# Patient Record
Sex: Female | Born: 1935
Health system: Southern US, Community
[De-identification: ages and names within clinical notes are randomized; demographics above are authoritative.]

## PROBLEM LIST (undated history)

## (undated) DIAGNOSIS — J302 Other seasonal allergic rhinitis: Secondary | ICD-10-CM

## (undated) DIAGNOSIS — Z9889 Other specified postprocedural states: Secondary | ICD-10-CM

## (undated) DIAGNOSIS — T63441A Toxic effect of venom of bees, accidental (unintentional), initial encounter: Secondary | ICD-10-CM

## (undated) DIAGNOSIS — M4800 Spinal stenosis, site unspecified: Secondary | ICD-10-CM

## (undated) DIAGNOSIS — M5126 Other intervertebral disc displacement, lumbar region: Secondary | ICD-10-CM

## (undated) DIAGNOSIS — K649 Unspecified hemorrhoids: Secondary | ICD-10-CM

## (undated) DIAGNOSIS — Z8719 Personal history of other diseases of the digestive system: Secondary | ICD-10-CM

## (undated) DIAGNOSIS — Z8619 Personal history of other infectious and parasitic diseases: Secondary | ICD-10-CM

## (undated) DIAGNOSIS — R112 Nausea with vomiting, unspecified: Secondary | ICD-10-CM

## (undated) DIAGNOSIS — L439 Lichen planus, unspecified: Secondary | ICD-10-CM

## (undated) DIAGNOSIS — M1990 Unspecified osteoarthritis, unspecified site: Secondary | ICD-10-CM

## (undated) DIAGNOSIS — M48061 Spinal stenosis, lumbar region without neurogenic claudication: Secondary | ICD-10-CM

## (undated) DIAGNOSIS — K219 Gastro-esophageal reflux disease without esophagitis: Secondary | ICD-10-CM

## (undated) DIAGNOSIS — S82009A Unspecified fracture of unspecified patella, initial encounter for closed fracture: Secondary | ICD-10-CM

## (undated) HISTORY — PX: OTHER SURGICAL HISTORY: SHX169

## (undated) HISTORY — PX: BACK SURGERY: SHX140

## (undated) HISTORY — PX: HERNIA REPAIR: SHX51

## (undated) HISTORY — PX: ABDOMINAL HYSTERECTOMY: SHX81

---

## 1998-03-10 ENCOUNTER — Ambulatory Visit (HOSPITAL_COMMUNITY): Admission: RE | Admit: 1998-03-10 | Discharge: 1998-03-10 | Payer: Self-pay | Admitting: Family Medicine

## 1998-03-30 ENCOUNTER — Ambulatory Visit (HOSPITAL_COMMUNITY): Admission: RE | Admit: 1998-03-30 | Discharge: 1998-03-30 | Payer: Self-pay | Admitting: Gastroenterology

## 1998-05-18 ENCOUNTER — Encounter: Payer: Self-pay | Admitting: Gastroenterology

## 1998-05-18 ENCOUNTER — Ambulatory Visit (HOSPITAL_COMMUNITY): Admission: RE | Admit: 1998-05-18 | Discharge: 1998-05-18 | Payer: Self-pay | Admitting: Gastroenterology

## 1999-12-08 ENCOUNTER — Ambulatory Visit (HOSPITAL_COMMUNITY): Admission: RE | Admit: 1999-12-08 | Discharge: 1999-12-08 | Payer: Self-pay | Admitting: Gastroenterology

## 1999-12-08 ENCOUNTER — Encounter (INDEPENDENT_AMBULATORY_CARE_PROVIDER_SITE_OTHER): Payer: Self-pay | Admitting: Specialist

## 1999-12-09 ENCOUNTER — Encounter: Payer: Self-pay | Admitting: Gastroenterology

## 1999-12-09 ENCOUNTER — Ambulatory Visit (HOSPITAL_COMMUNITY): Admission: RE | Admit: 1999-12-09 | Discharge: 1999-12-09 | Payer: Self-pay | Admitting: Gastroenterology

## 2000-11-15 ENCOUNTER — Encounter: Admission: RE | Admit: 2000-11-15 | Discharge: 2000-11-15 | Payer: Self-pay | Admitting: Family Medicine

## 2000-11-15 ENCOUNTER — Encounter: Payer: Self-pay | Admitting: Family Medicine

## 2003-07-02 ENCOUNTER — Encounter: Admission: RE | Admit: 2003-07-02 | Discharge: 2003-07-02 | Payer: Self-pay | Admitting: Internal Medicine

## 2004-07-13 ENCOUNTER — Encounter (INDEPENDENT_AMBULATORY_CARE_PROVIDER_SITE_OTHER): Payer: Self-pay | Admitting: *Deleted

## 2004-07-13 ENCOUNTER — Ambulatory Visit (HOSPITAL_COMMUNITY): Admission: RE | Admit: 2004-07-13 | Discharge: 2004-07-13 | Payer: Self-pay | Admitting: Gastroenterology

## 2004-08-16 ENCOUNTER — Encounter: Admission: RE | Admit: 2004-08-16 | Discharge: 2004-08-16 | Payer: Self-pay | Admitting: Orthopedic Surgery

## 2004-10-11 ENCOUNTER — Observation Stay (HOSPITAL_COMMUNITY): Admission: RE | Admit: 2004-10-11 | Discharge: 2004-10-12 | Payer: Self-pay | Admitting: Orthopedic Surgery

## 2004-10-11 ENCOUNTER — Ambulatory Visit (HOSPITAL_BASED_OUTPATIENT_CLINIC_OR_DEPARTMENT_OTHER): Admission: RE | Admit: 2004-10-11 | Discharge: 2004-10-11 | Payer: Self-pay | Admitting: Orthopedic Surgery

## 2005-08-17 ENCOUNTER — Encounter: Admission: RE | Admit: 2005-08-17 | Discharge: 2005-08-17 | Payer: Self-pay | Admitting: Family Medicine

## 2007-03-22 ENCOUNTER — Encounter: Admission: RE | Admit: 2007-03-22 | Discharge: 2007-03-22 | Payer: Self-pay | Admitting: Family Medicine

## 2007-04-13 ENCOUNTER — Encounter: Admission: RE | Admit: 2007-04-13 | Discharge: 2007-04-13 | Payer: Self-pay | Admitting: Family Medicine

## 2007-06-01 ENCOUNTER — Encounter (HOSPITAL_COMMUNITY): Admission: RE | Admit: 2007-06-01 | Discharge: 2007-07-01 | Payer: Self-pay | Admitting: Podiatry

## 2008-06-10 ENCOUNTER — Encounter: Admission: RE | Admit: 2008-06-10 | Discharge: 2008-06-10 | Payer: Self-pay | Admitting: Family Medicine

## 2009-02-20 ENCOUNTER — Encounter: Admission: RE | Admit: 2009-02-20 | Discharge: 2009-02-20 | Payer: Self-pay | Admitting: Family Medicine

## 2009-06-16 ENCOUNTER — Encounter: Admission: RE | Admit: 2009-06-16 | Discharge: 2009-06-16 | Payer: Self-pay | Admitting: Family Medicine

## 2010-01-20 ENCOUNTER — Encounter: Admission: RE | Admit: 2010-01-20 | Discharge: 2010-01-20 | Payer: Self-pay | Admitting: Gastroenterology

## 2010-09-20 ENCOUNTER — Encounter: Payer: Self-pay | Admitting: Family Medicine

## 2011-01-14 NOTE — Op Note (Signed)
Linda Watson, Linda Watson               ACCOUNT NO.:  [id number]   MEDICAL RECORD NO.:  [id number]          PATIENT TYPE:  AMB   LOCATION:  [facility]                         FACILITY:  [facility]   PHYSICIAN:  Marlowe Kays, M.D.  DATE OF BIRTH:  September 08, 1935   DATE OF PROCEDURE:  10/11/2004  DATE OF DISCHARGE:                                 OPERATIVE REPORT   PREOPERATIVE DIAGNOSES:  1.  Degenerative arthritis acromioclavicular joint.  2.  Chronic impingement syndrome with bursal surface rotator cuff tears,      left shoulder.   POSTOPERATIVE DIAGNOSES:  1.  Degenerative arthritis acromioclavicular joint.  2.  Chronic impingement syndrome with bursal surface rotator cuff tears,      left shoulder.   OPERATION PERFORMED:  1.  Left shoulder arthroscopy (normal examination).  2.  Arthroscopic subacromial decompression.  3.  Open resection distal left clavicle.   SURGEON:  Marlowe Kays, M.D.   ASSISTANTDruscilla Brownie. Cherlynn June.   ANESTHESIA:  General.   PATHOLOGY AND JUSTIFICATION FOR PROCEDURE:  Chronic __________ pain, left  shoulder with MRI demonstrating the bursal surface rotator cuff tears with  type two acromion and degenerative arthritis of the [facility] joint confirmed on  plain films.   DESCRIPTION OF PROCEDURE:  Under satisfactory general anesthesia, beach  chair position on the Schlein frame, surgery was preceded by interscalene  block by anesthesiologist, left shoulder girdle was prepped with DuraPrep  and draped in a sterile field.  Anatomy of the shoulder joint was marked out  for hemostasis.  The incision for the distal clavicle resection, lateral  portal and posterior portal as well as a subacromial space were all  infiltrated with 0.5% Marcaine with Adrenalin.  Through a posterior soft  spot portal, I atraumatically entered the glenohumeral joint and found it to  be normal on examination.  I then redirected the scope to the subacromial  space and through a lateral portal   introduced a blunt cannula followed by  the 4.2 shaver.  She had a very thickened bursa and I finally located, I was  able to triangulate and see the shaver and began removing the thickened  bursa.  Following this, it was noted that she had severe impingement problem  with several bony spikes.  I used a 4-0 oval bur to begin burring down the  subacromial space and went back and forth between the bur with the 4.2  shaver, shaving down the rotator cuff and the vaporizer removing soft tissue  and coagulating until we had a nice wide decompression.  I then made  incision over the distal clavicle and located the [facility] joint, I measured off a  little more than a centimeter of distal clavicle and protected the  underlying structures with Baby Homans, used a micro saw to resect the end  of the clavicle which I removed.  I then smoothed off the cut end of the  bone and applied bone wax. After irrigating the wound well, placed Gelfoam  in the resection interval and closed the wound with interrupted #1 Vicryl in  the fascia,  3-0 Vicryl  subcutaneous tissue, with Steri-Strips on the skin and 4-0 nylon  in the two portals.  Dry sterile dressing, shoulder immobilized were  applied.  The patient tolerated the procedure well and was taken to recovery  in satisfactory condition with no known complications.      JA/MEDQ  D:  10/11/2004  T:  10/11/2004  Job:  [id number]

## 2011-01-14 NOTE — Op Note (Signed)
Linda Watson, Linda Watson               ACCOUNT NO.:  [id number]   MEDICAL RECORD NO.:  [id number]          PATIENT TYPE:  AMB   LOCATION:  ENDO                         FACILITY:  [facility]   PHYSICIAN:  Petra Kuba, M.D.    DATE OF BIRTH:  [birth year]   DATE OF PROCEDURE:  07/13/2004  DATE OF DISCHARGE:                                 OPERATIVE REPORT   PROCEDURE:  Colonoscopy.   INDICATIONS FOR PROCEDURE:  Patient almost due for colonic screening.  Guaiac-positive on physical exam.   DESCRIPTION OF PROCEDURE:  Consent was signed after the risks, benefits,  methods, options were thoroughly discussed in the office.   MEDICATIONS GIVEN:  Demerol 70, Versed 7.   DESCRIPTION OF PROCEDURE:  Rectal inspection was pertinent for small  external hemorrhoids.  Digital exam was negative.  The pediatric video  adjustable colonoscope was inserted, and again, with difficulty due to a  very tortuous sigmoid, once through this area was fairly easily able to be  advanced around the colon to the cecum.  This did require abdominal pressure  but no position changes.  No abnormality or signs of bleeding were seen on  insertion.  The cecum was identified by the appendiceal orifice and the  ileocecal valve.  The scope was inserted a short way within the terminal  ileum which was normal.  Photo documentation was obtained.  The scope was  slowly withdrawn.  The prep was adequate.  There was some liquid stool that  required washing and suctioning on slow withdrawal through the colon.  The  cecum, ascending, transverse, descending, and the majority of the sigmoid  were normal.  She did have a very tortuous sigmoid, and when we fell back  around the loop, we did try to readvance around the curves to decrease the  chances of missing things, but it was very tortuous.  At the sigmoid/rectal  junction, probably due to scope trauma, was a small __________ a polyp, but  a few cold biopsies were obtained.  Once back in  the rectum, anorectal pull  through and retroflexion confirmed some small hemorrhoids.  The scope was  straightened and readvanced a short way up the left side of the colon.  The  air was suctioned and the scope removed.  The patient tolerated the procedure well.  There were no obvious immediate  complications.   ENDOSCOPIC DIAGNOSES:  1.  Internal and external hemorrhoids.  2.  Questionable proximal rectal polyp, cold biopsies; questionably due to      scope trauma.  3.  Tortuous, particularly in the sigmoid.  4.  Otherwise within normal limits to the terminal ileum, without any blood      being seen.   PLAN:  1.  Await pathology.  2.  Probably recheck colon screening in five years unless this is an      adenoma.  Consider when repeat screening is needed and one-time virtual      colonoscopy if widely detectable at that junction.  Otherwise, follow up      in one to two months to recheck guaiac symptoms  and CBCs and make sure      no further workup plans are needed.      MEM/MEDQ  D:  07/13/2004  T:  07/13/2004  Job:  [id number]   cc:   Gwen Pounds, MD  Fax: [phone]

## 2012-01-12 ENCOUNTER — Other Ambulatory Visit: Payer: Self-pay | Admitting: Otolaryngology

## 2012-01-13 ENCOUNTER — Encounter (HOSPITAL_COMMUNITY): Payer: Self-pay | Admitting: Pharmacy Technician

## 2012-01-16 ENCOUNTER — Encounter (HOSPITAL_COMMUNITY)
Admission: RE | Admit: 2012-01-16 | Discharge: 2012-01-16 | Disposition: A | Discharge status: Home / Self Care | Payer: Medicare Other | Source: Ambulatory Visit | Attending: Specialist | Admitting: Specialist

## 2012-01-16 ENCOUNTER — Ambulatory Visit (HOSPITAL_COMMUNITY)
Admission: RE | Admit: 2012-01-16 | Discharge: 2012-01-16 | Disposition: A | Discharge status: Home / Self Care | Payer: Medicare Other | Source: Ambulatory Visit | Attending: Otolaryngology | Admitting: Otolaryngology

## 2012-01-16 ENCOUNTER — Encounter (HOSPITAL_COMMUNITY): Payer: Self-pay

## 2012-01-16 DIAGNOSIS — Z01818 Encounter for other preprocedural examination: Secondary | ICD-10-CM | POA: Insufficient documentation

## 2012-01-16 DIAGNOSIS — M47817 Spondylosis without myelopathy or radiculopathy, lumbosacral region: Secondary | ICD-10-CM | POA: Insufficient documentation

## 2012-01-16 DIAGNOSIS — Z01812 Encounter for preprocedural laboratory examination: Secondary | ICD-10-CM | POA: Insufficient documentation

## 2012-01-16 HISTORY — DX: Unspecified osteoarthritis, unspecified site: M19.90

## 2012-01-16 HISTORY — DX: Spinal stenosis, site unspecified: M48.00

## 2012-01-16 HISTORY — DX: Other specified postprocedural states: R11.2

## 2012-01-16 HISTORY — DX: Lichen planus, unspecified: L43.9

## 2012-01-16 HISTORY — DX: Gastro-esophageal reflux disease without esophagitis: K21.9

## 2012-01-16 HISTORY — DX: Other specified postprocedural states: Z98.890

## 2012-01-16 LAB — DIFFERENTIAL
Eosinophils Absolute: 0.4 10*3/uL (ref 0.0–0.7)
Eosinophils Relative: 5 % (ref 0–5)
Lymphs Abs: 1.7 10*3/uL (ref 0.7–4.0)
Monocytes Absolute: 0.7 10*3/uL (ref 0.1–1.0)
Monocytes Relative: 9 % (ref 3–12)

## 2012-01-16 LAB — URINALYSIS, ROUTINE W REFLEX MICROSCOPIC
Glucose, UA: NEGATIVE mg/dL
Hgb urine dipstick: NEGATIVE
Ketones, ur: NEGATIVE mg/dL
Protein, ur: NEGATIVE mg/dL

## 2012-01-16 LAB — COMPREHENSIVE METABOLIC PANEL
ALT: 14 U/L (ref 0–35)
Albumin: 4 g/dL (ref 3.5–5.2)
Alkaline Phosphatase: 82 U/L (ref 39–117)
GFR calc Af Amer: >90 mL/min (ref >90)
Glucose, Bld: 82 mg/dL (ref 70–99)
Potassium: 4.2 mEq/L (ref 3.5–5.1)
Sodium: 138 mEq/L (ref 135–145)
Total Protein: 7 g/dL (ref 6.0–8.3)

## 2012-01-16 LAB — CBC
Hemoglobin: 13 g/dL (ref 12.0–15.0)
MCHC: 33 g/dL (ref 30.0–36.0)

## 2012-01-16 LAB — SURGICAL PCR SCREEN: Staphylococcus aureus: NEGATIVE

## 2012-01-16 NOTE — Pre-Procedure Instructions (Signed)
PT HAS EKG REPORT FROM [facility] THAT WAS DONE 01/02/12-REPORT ON PT'S CHART. CXR NOT NEEDED PER ANESTHESIOLOGIST'S GUIDELINES. CBC WITH DIFF, CMET, UA AND LUMBAR SPINE XRAY WERE DONE TODAY - PREOP - AT [facility].

## 2012-01-16 NOTE — Patient Instructions (Signed)
YOUR SURGERY IS SCHEDULED ON:  WED  5/29  AT 11:00 AM  REPORT TO [place] SHORT STAY [facility]ENTER AT:  9:00 AM      PHONE # FOR SHORT STAY IS [phone]  DO NOT EAT OR DRINK ANYTHING AFTER MIDNIGHT THE NIGHT BEFORE YOUR SURGERY.  YOU MAY BRUSH YOUR TEETH, RINSE OUT YOUR MOUTH--BUT NO WATER, NO FOOD, NO CHEWING GUM, NO MINTS, NO CANDIES, NO CHEWING TOBACCO.  PLEASE TAKE THE FOLLOWING MEDICATIONS THE AM OF YOUR SURGERY WITH A FEW SIPS OF WATER: PANTOPRAZOLE, VALTREX IF NEEDED.    IF YOU USE INHALERS--USE YOUR INHALERS THE AM OF YOUR SURGERY AND BRING INHALERS TO THE HOSPITAL -TAKE TO SURGERY.    IF YOU ARE DIABETIC:  DO NOT TAKE ANY DIABETIC MEDICATIONS THE AM OF YOUR SURGERY.  IF YOU TAKE INSULIN IN THE EVENINGS--PLEASE ONLY TAKE 1/2 NORMAL EVENING DOSE THE NIGHT BEFORE YOUR SURGERY.  NO INSULIN THE AM OF YOUR SURGERY.  IF YOU HAVE SLEEP APNEA AND USE CPAP OR BIPAP--PLEASE BRING THE MASK --NOT THE MACHINE-NOT THE TUBING   -JUST THE MASK. DO NOT BRING VALUABLES, MONEY, CREDIT CARDS.  CONTACT LENS, DENTURES / PARTIALS, GLASSES SHOULD NOT BE WORN TO SURGERY AND IN MOST CASES-HEARING AIDS WILL NEED TO BE REMOVED.  BRING YOUR GLASSES CASE, ANY EQUIPMENT NEEDED FOR YOUR CONTACT LENS. FOR PATIENTS ADMITTED TO THE HOSPITAL--CHECK OUT TIME THE DAY OF DISCHARGE IS 11:00 AM.  ALL INPATIENT ROOMS ARE PRIVATE - WITH BATHROOM, TELEPHONE, TELEVISION AND WIFI INTERNET. IF YOU ARE BEING DISCHARGED THE SAME DAY OF YOUR SURGERY--YOU CAN NOT DRIVE YOURSELF HOME--AND SHOULD NOT GO HOME ALONE BY TAXI OR BUS.  NO DRIVING OR OPERATING MACHINERY FOR 24 HOURS FOLLOWING ANESTHESIA / PAIN MEDICATIONS.                            SPECIAL INSTRUCTIONS:  CHLORHEXIDINE SOAP SHOWER (other brand names are Betasept and Hibiclens ) PLEASE SHOWER WITH CHLORHEXIDINE THE NIGHT BEFORE YOUR SURGERY AND THE AM OF YOUR SURGERY. DO NOT USE CHLORHEXIDINE ON YOUR FACE OR PRIVATE AREAS--YOU MAY USE YOUR NORMAL SOAP THOSE AREAS AND YOUR NORMAL  SHAMPOO.  WOMEN SHOULD AVOID SHAVING UNDER ARMS AND SHAVING LEGS 48 HOURS BEFORE USING CHLORHEXIDINE TO AVOID SKIN IRRITATION.  DO NOT USE IF ALLERGIC TO CHLORHEXIDINE.  PLEASE READ OVER ANY  FACT SHEETS THAT YOU WERE GIVEN: MRSA INFORMATION,INCENTIVE SPIROMETER INFORMATION.

## 2012-01-20 NOTE — H&P (Signed)
Chief Complaint:  back and left leg numbness with standing  Subjective: Patient is admitted for L4-5,L5-S1 lumbar decompression  Patient is a 76 y.o. female who has been seen by Dr. Shelle Iron for ongoing back and LE pain  They have been followed and found to have continued progressive pain.  They have been treated conservatively in the past including medications.  Despite conservative measures, they continue to have pain.  X-rays show that the patient has spinal stenosis L4-5 and L5-S1.  It is felt that they would benefit from undergoing surgical intervention.  Risks and benefits have been discussed with the patient and they elect to proceed with surgery.  The patient has no contraindications to the upcoming procedure such as ongoing infection or progressive neurological disease.  Allergies: No Known Allergies   Medications: Tylenol (325MG  Tablet, 1 Oral) Active. Meloxicam (7.5MG  Tablet, Oral) Active. Methotrexate (2.5MG  Tablet, Oral) Active. Omeprazole (40MG  Capsule DR, Oral) Active Valacyclovir prn   Past Medical History: Past Medical History  Diagnosis Date  . Lichen planus     LESIONS  IN MOUTH AND OCCASIONALLY GENITAL AREA  . GERD (gastroesophageal reflux disease)   . Spinal stenosis     PAIN IN LOWER BACK AND DOWN LEFT LEG -ALSO HAS NUMBNESS AND TINGLING IN LEFT LEG TO FOOT  . Arthritis     RIGHT KNEE PAIN AND ARTHRITIS  . PONV (postoperative nausea and vomiting)      Past Surgical History: Past Surgical History  Procedure Date  . Abdominal hysterectomy   . Left shoulder surgery   . Bilateral knee arthroscopies   . Hiatal hernia repair      Family History: Cancer. sister Cerebrovascular Accident. sister Heart Disease. mother and father Hypertension. sister Kidney disease. sister Osteoarthritis. sister  Social History: History  Substance Use Topics  . Smoking status: Never Smoker   . Smokeless tobacco: Never Used  . Alcohol Use: No     Review of  Systems General:Not Present- Chills, Fever, Night Sweats, Appetite Loss, Fatigue, Feeling sick, Weight Gain and Weight Loss. Skin:Not Present- Itching, Rash, Skin Color Changes, Ulcer, Psoriasis and Change in Hair or Nails. HEENT:Present- Sensitivity to light and Ringing in the Ears. Not Present- Hearing problems and Nose Bleed. Neck:Not Present- Swollen Glands and Neck Mass. Respiratory:Not Present- Snoring, Chronic Cough, Bloody sputum and Dyspnea. Cardiovascular:Present- Leg Cramps. Not Present- Shortness of Breath, Chest Pain, Swelling of Extremities and Palpitations. Gastrointestinal:Present- Heartburn. Not Present- Bloody Stool, Abdominal Pain, Vomiting, Nausea and Incontinence of Stool. Female Genitourinary:Not Present- Blood in Urine, Menstrual Irregularities, Frequency, Incontinence and Nocturia. Musculoskeletal:Present- Joint Stiffness, Joint Swelling, Joint Pain and Back Pain. Not Present- Muscle Weakness and Muscle Pain. Neurological:Present- Tingling, Numbness and Burning. Not Present- Tremor, Headaches and Dizziness. Psychiatric:Not Present- Anxiety, Depression and Memory Loss. Endocrine:Not Present- Cold Intolerance, Heat Intolerance, Excessive hunger and Excessive Thirst. Hematology:Not Present- Abnormal Bleeding, Anemia, Blood Clots and Easy Bruising. Physical Exam:  Vitals: Pulse:60 Respirations:10 Blood Pressure:125/68  General: Alert and oriented x2, mild distress HEENT: atraumatic, PERRL Neck: supple, no lymphadenopathy Chest:  Clear to ausculation bilaterally, no rhonchi, wheezes, or rales Heart:  Regular Rate and Rhythm Abdomen:  Soft, NTT, BS x 4 Extremities:  - SLR bilateral, no pain with ROM hips, +pain with extension of Lsp     Assessment/Plan: Severe Spinal Stenosis L4-5, L5-S1 The patient is being admitted to [facility] to undergo a  Lumbar decompression.  Surgery will be performed by Dr. Jene Every.  Risks and benefits have been  discussed with  the patient and they elect to proceed wth the procedure.

## 2012-01-21 ENCOUNTER — Encounter (HOSPITAL_COMMUNITY): Payer: Self-pay | Admitting: *Deleted

## 2012-01-21 ENCOUNTER — Emergency Department (HOSPITAL_COMMUNITY): Payer: Medicare Other

## 2012-01-21 ENCOUNTER — Emergency Department (HOSPITAL_COMMUNITY)
Admission: EM | Admit: 2012-01-21 | Discharge: 2012-01-21 | Disposition: A | Discharge status: Home / Self Care | Payer: Medicare Other | Attending: Emergency Medicine | Admitting: Emergency Medicine

## 2012-01-21 DIAGNOSIS — S82009A Unspecified fracture of unspecified patella, initial encounter for closed fracture: Secondary | ICD-10-CM | POA: Insufficient documentation

## 2012-01-21 DIAGNOSIS — Y92009 Unspecified place in unspecified non-institutional (private) residence as the place of occurrence of the external cause: Secondary | ICD-10-CM | POA: Insufficient documentation

## 2012-01-21 DIAGNOSIS — W108XXA Fall (on) (from) other stairs and steps, initial encounter: Secondary | ICD-10-CM | POA: Insufficient documentation

## 2012-01-21 DIAGNOSIS — K219 Gastro-esophageal reflux disease without esophagitis: Secondary | ICD-10-CM | POA: Insufficient documentation

## 2012-01-21 MED ORDER — OXYCODONE-ACETAMINOPHEN 5-325 MG PO TABS: 1.0000 | ORAL_TABLET | Freq: Four times a day (QID) | ORAL | Status: AC | PRN

## 2012-01-21 NOTE — ED Notes (Signed)
Pt from home with reports of missing a step and falling injuring right knee with swelling and abrasion noted.

## 2012-01-21 NOTE — ED Notes (Signed)
[employer] called for knee immobilizer and crunches.

## 2012-01-21 NOTE — ED Provider Notes (Signed)
History     CSN: [id number]  Arrival date & time 01/21/12  1232   First MD Initiated Contact with Patient 01/21/12 1329      Chief Complaint  Patient presents with  . Fall  . Knee Pain  . Nausea    (Consider location/radiation/quality/duration/timing/severity/associated sxs/prior treatment) HPI....Marland Kitchenaccidental fall on his right knee. No other injuries. Range of motion makes pain worse. Pain is moderate. Accident happened a brief time ago. Described as sharp  Past Medical History  Diagnosis Date  . Lichen planus     LESIONS  IN MOUTH AND OCCASIONALLY GENITAL AREA  . GERD (gastroesophageal reflux disease)   . Spinal stenosis     PAIN IN LOWER BACK AND DOWN LEFT LEG -ALSO HAS NUMBNESS AND TINGLING IN LEFT LEG TO FOOT  . Arthritis     RIGHT KNEE PAIN AND ARTHRITIS  . PONV (postoperative nausea and vomiting)     Past Surgical History  Procedure Date  . Abdominal hysterectomy   . Left shoulder surgery   . Bilateral knee arthroscopies   . Hiatal hernia repair     History reviewed. No pertinent family history.  History  Substance Use Topics  . Smoking status: Never Smoker   . Smokeless tobacco: Never Used  . Alcohol Use: No    OB History    Grav Para Term Preterm Abortions TAB SAB Ect Mult Living                  Review of Systems  All other systems reviewed and are negative.    Allergies  Review of patient's allergies indicates no known allergies.  Home Medications   Current Outpatient Rx  Name Route Sig Dispense Refill  . ACETAMINOPHEN 500 MG PO CAPS Oral Take by mouth. PT ALWAYS TAKES ONE AT BEDTIME FOR BACK PAIN--MIGHT TAKE ONE DURING THE DAY IF NEEDED FOR PAIN    . CLOTRIMAZOLE 1 % EX CREA Topical Apply topically 2 (two) times daily.    . MELOXICAM 15 MG PO TABS Oral Take 15 mg by mouth daily with breakfast.    . METHOTREXATE 2.5 MG PO TABS Oral Take 2.5 mg by mouth once a week. On Tuesday. Caution:Chemotherapy. Protect from light. For treatment of  lichen planus of mouth    . PANTOPRAZOLE SODIUM 40 MG PO TBEC Oral Take 40 mg by mouth daily with breakfast.    . VALACYCLOVIR HCL 500 MG PO TABS Oral Take 500 mg by mouth daily as needed. For rash      BP 135/77  Pulse 76  Temp(Src) 98.4 F (36.9 C) (Oral)  Resp 18  SpO2 98%  Physical Exam  Nursing note and vitals reviewed. Constitutional: She is oriented to person, place, and time. She appears well-developed and well-nourished.  HENT:  Head: Normocephalic and atraumatic.  Eyes: Conjunctivae and EOM are normal. Pupils are equal, round, and reactive to light.  Neck: Normal range of motion. Neck supple.  Cardiovascular: Normal rate and regular rhythm.   Pulmonary/Chest: Effort normal and breath sounds normal.  Abdominal: Soft. Bowel sounds are normal.  Musculoskeletal:       Tender and puffy anterior aspect of right knee. Pain with range of motion.  Neurological: She is alert and oriented to person, place, and time.  Skin: Skin is warm and dry.  Psychiatric: She has a normal mood and affect.    ED Course  Procedures (including critical care time)  Labs Reviewed - No data to display Dg Knee Complete 4  Views Right  01/21/2012  *RADIOLOGY REPORT*  Clinical Data: Fall.  Knee pain and swelling.  RIGHT KNEE - COMPLETE 4+ VIEW  Comparison: None.  Findings: Lipohemarthrosis noted.  Chondrocalcinosis is present with severe loss of articular space in the medial compartment, subcortical cyst formation, subcortical sclerosis, and marginal osteophyte.  Spurring of the tibial spine noted.  Bony demineralization noted.  Prominent patellofemoral spurring is present.  Cortical irregularity related to femoral condylar spurring noted. On the lateral projection, the patellar tendon is attenuated in its mid to distal portion.  There is contrast calcinosis along the gastrocnemius tendons.  A fabella is present.  A well-defined fracture is not seen.  IMPRESSION:  1.  Lipohemarthrosis.  Although I do not  observe a well-defined fracture, the likelihood of occult fracture is high. CT or MRI could be utilized for further characterization. 2.  Attenuation of the distal patellar tendon could reflect patellar tendon injury, but no patella alta is observed. 3.  Extensive spurring with severe loss of articular space in the medial compartment and chondrocalcinosis, favoring CPPD arthropathy.  Original Report Authenticated By: Dellia Cloud, M.D.     No diagnosis found.    MDM  CT scan of knee pending.   Discuss with Dr. Helayne Seminole, MD 01/21/12 1535

## 2012-01-21 NOTE — ED Provider Notes (Signed)
Comminuted patellar fracture. Discussed with orthopedics Dr. Victorino Dike. We'll place in a Jones dressing and knee immobilizer. Weightbearing as tolerated. Provided crutches. Discharged home with pain medication. Try to followup with orthopedics on Tuesday.  Dayton Bailiff, MD 01/21/12 [id number]

## 2012-01-21 NOTE — Discharge Instructions (Signed)
Patellar Fracture, Adult  A patellar fracture is a break in the little round bone (kneecap) that is the bump on the front of your knee. A direct blow to the knee, or fall, is usually the cause of a broken patella. Sometimes, a very hard and strong bending of the knee (like jumping events in sports) can cause a fracture. Usually the knee is tender and swollen and has pain with motion, especially trying to straighten out the leg. There may be difficulty walking or putting weight on the affected side. These fractures generally heal in about 4 to 6 weeks.  DIAGNOSIS   The diagnosis is usually easily made with an exam and x-ray.  TREATMENT   Treatment is dependent on the type of fracture:   If the fracture is undisplaced or only slightly displaced (this means the position of the parts is good) then, after any blood in the joint has been removed, and if you can still straighten your leg out, you can usually be treated with a splint or cast for 4 to 6 weeks. Straightening out your leg is called extension and the ability to do this is with the extensor mechanism. Every day while in treatment quadriceps exercises should be performed, or as directed by your caregivers.   If there is a stellate (comminuted) fracture of the patella (this means the patella is in multiple small pieces), the blood in the joint may be removed and if you are able to straighten your leg, you can usually be treated with a splint or cast for 4 to 6 weeks. Sometimes this type of fracture may be treated by removing the patella. You will still have a good knee without a patella. If this is done the knee still will need to be in a plaster cast or splint for the next 4 to 6 weeks.   If the fracture is a displaced transverse fracture and you cannot extend (straighten out your leg), then an operation is required to hold the bony fragments together until they heal. Again a plaster cast or splint is worn until the extension mechanism of the knee is regained.  This means the knee is healed and you can straighten out your leg again normally.   Only take over-the-counter or prescription medicines for pain, discomfort, or fever as directed by your caregiver.   Warning: Do not drive a car or operate a motor vehicle until your caregiver specifically tells you it is safe to do so.  HOME CARE INSTRUCTIONS    You may resume normal diet and activities as directed or allowed.   Keep ice packs (a bag of ice wrapped in a towel) on the knee for twenty minutes, four times per day, for the first two days.   Change dressings if necessary or as directed.   Only take over-the-counter or prescription medicines for pain, discomfort, or fever as directed by your caregiver.   Use crutches as directed and exercise leg as directed.   Keep appointments as directed.  SEEK IMMEDIATE MEDICAL CARE IF :   Redness, swelling, or increasing pain in the knee.   An unexplained oral temperature above 102 F (38.9 C) develops.  Document Released: 05/14/2003 Document Revised: 08/04/2011 Document Reviewed: 03/28/2008  [employer] Patient Information 2012 [employer]

## 2012-01-25 ENCOUNTER — Other Ambulatory Visit: Payer: Self-pay | Admitting: Otolaryngology

## 2012-03-12 ENCOUNTER — Encounter (HOSPITAL_COMMUNITY): Payer: Self-pay | Admitting: Pharmacy Technician

## 2012-03-14 ENCOUNTER — Other Ambulatory Visit: Payer: Self-pay | Admitting: Specialist

## 2012-03-14 NOTE — Patient Instructions (Signed)
20 Linda Watson  03/14/2012   Your procedure is scheduled on:  03/21/12 [id number]  Report to [facility] at 0530 AM.  Call this number if you have problems the morning of surgery: [phone]   Remember:   Do not eat food:After Midnight.  May have clear liquids:until Midnight .   Take these medicines the morning of surgery with A SIP OF WATER:   Do not wear jewelry, make-up or nail polish.  Do not wear lotions, powders, or perfumes  Do not shave 48 hours prior to surgery.   Do not bring valuables to the hospital.  Contacts, dentures or bridgework may not be worn into surgery.  Leave suitcase in the car. After surgery it may be brought to your room.  For patients admitted to the hospital, checkout time is 11:00 AM the day of discharge.     Special Instructions: CHG Shower Use Special Wash: 1/2 bottle night before surgery and 1/2 bottle morning of surgery.SHOWER CHIN TO TOES WITH CHG.  [facility] FACE AND PRIVATE PARTS WITH REGULAR SOAP.    Please read over the following fact sheets that you were given: MRSA Information, COUGHING AND DEEP BREATHING EXERCISES , LEG EXERCISES  Acetaminophen tablets or caplets What is this medicine? ACETAMINOPHEN (a set a MEE noe fen) is a pain reliever. It is used to treat mild pain and fever. This medicine may be used for other purposes; ask your health care provider or pharmacist if you have questions. What should I tell my health care provider before I take this medicine? They need to know if you have any of these conditions: -if you frequently drink alcohol containing drinks -liver disease -an unusual or allergic reaction to acetaminophen, other medicines, foods, dyes or preservatives -pregnant or trying to get pregnant -breast-feeding How should I use this medicine? Take this medicine by mouth with a glass of water. Follow the directions on the package or prescription label. Take your medicine at regular intervals. Do not take  your medicine more often than directed. Talk to your pediatrician regarding the use of this medicine in children. While this drug may be prescribed for children as young as 19 years of age for selected conditions, precautions do apply. Overdosage: If you think you have taken too much of this medicine contact a poison control center or emergency room at once. NOTE: This medicine is only for you. Do not share this medicine with others. What if I miss a dose? If you miss a dose, take it as soon as you can. If it is almost time for your next dose, take only that dose. Do not take double or extra doses. What may interact with this medicine? -alcohol -imatinib -isoniazid -other medicines with acetaminophen This list may not describe all possible interactions. Give your health care provider a list of all the medicines, herbs, non-prescription drugs, or dietary supplements you use. Also tell them if you smoke, drink alcohol, or use illegal drugs. Some items may interact with your medicine. What should I watch for while using this medicine? Tell your doctor or health care professional if the pain lasts more than 10 days (5 days for children), if it gets worse, or if there is a new or different kind of pain. Also, check with your doctor if a fever lasts for more than 3 days. Do not take other medicines that contain acetaminophen with this medicine. Always read labels carefully. If you have questions, ask your doctor or pharmacist. If you  take too much acetaminophen get medical help right away. Too much acetaminophen can be very dangerous and cause liver damage. Even if you do not have symptoms, it is important to get help right away. What side effects may I notice from receiving this medicine? Side effects that you should report to your doctor or health care professional as soon as possible: -allergic reactions like skin rash, itching or hives, swelling of the face, lips, or tongue -breathing  problems -fever or sore throat -trouble passing urine or change in the amount of urine -unusual bleeding or bruising -unusually weak or tired -yellowing of the eyes or skin Side effects that usually do not require medical attention (report to your doctor or health care professional if they continue or are bothersome): -headache -nausea, stomach upset This list may not describe all possible side effects. Call your doctor for medical advice about side effects. You may report side effects to FDA at 1-800-FDA-1088. Where should I keep my medicine? Keep out of reach of children. Store at room temperature between 20 and 25 degrees C (68 and 77 degrees F). Protect from moisture and heat. Throw away any unused medicine after the expiration date. NOTE: This sheet is a summary. It may not cover all possible information. If you have questions about this medicine, talk to your doctor, pharmacist, or health care provider.  2012, [employer]vier/[employer] Standard. (07/22/2009 10:38:02 AM)

## 2012-03-15 ENCOUNTER — Encounter (HOSPITAL_COMMUNITY): Payer: Self-pay

## 2012-03-15 ENCOUNTER — Other Ambulatory Visit: Payer: Self-pay | Admitting: Specialist

## 2012-03-15 ENCOUNTER — Encounter (HOSPITAL_COMMUNITY)
Admission: RE | Admit: 2012-03-15 | Discharge: 2012-03-15 | Disposition: A | Discharge status: Home / Self Care | Payer: Medicare Other | Source: Ambulatory Visit | Attending: Specialist | Admitting: Specialist

## 2012-03-15 ENCOUNTER — Ambulatory Visit (HOSPITAL_COMMUNITY)
Admission: RE | Admit: 2012-03-15 | Discharge: 2012-03-15 | Disposition: A | Discharge status: Home / Self Care | Payer: Medicare Other | Source: Ambulatory Visit | Attending: Specialist | Admitting: Specialist

## 2012-03-15 DIAGNOSIS — M545 Low back pain, unspecified: Secondary | ICD-10-CM | POA: Insufficient documentation

## 2012-03-15 DIAGNOSIS — Z01812 Encounter for preprocedural laboratory examination: Secondary | ICD-10-CM | POA: Insufficient documentation

## 2012-03-15 DIAGNOSIS — M4120 Other idiopathic scoliosis, site unspecified: Secondary | ICD-10-CM | POA: Insufficient documentation

## 2012-03-15 DIAGNOSIS — Z01818 Encounter for other preprocedural examination: Secondary | ICD-10-CM | POA: Insufficient documentation

## 2012-03-15 HISTORY — DX: Unspecified fracture of unspecified patella, initial encounter for closed fracture: S82.009A

## 2012-03-15 LAB — CBC
Hemoglobin: 13.1 g/dL (ref 12.0–15.0)
Platelets: 302 10*3/uL (ref 150–400)
RBC: 4.46 MIL/uL (ref 3.87–5.11)
WBC: 8.6 10*3/uL (ref 4.0–10.5)

## 2012-03-15 LAB — COMPREHENSIVE METABOLIC PANEL
ALT: 15 U/L (ref 0–35)
AST: 22 U/L (ref 0–37)
Alkaline Phosphatase: 93 U/L (ref 39–117)
CO2: 28 mEq/L (ref 19–32)
Calcium: 9.1 mg/dL (ref 8.4–10.5)
Chloride: 101 mEq/L (ref 96–112)
GFR calc Af Amer: >90 mL/min (ref >90)
GFR calc non Af Amer: 82 mL/min — ABNORMAL LOW (ref >90)
Glucose, Bld: 92 mg/dL (ref 70–99)
Potassium: 4.3 mEq/L (ref 3.5–5.1)
Sodium: 137 mEq/L (ref 135–145)
Total Bilirubin: 0.6 mg/dL (ref 0.3–1.2)

## 2012-03-15 LAB — URINALYSIS, ROUTINE W REFLEX MICROSCOPIC
Glucose, UA: NEGATIVE mg/dL
Hgb urine dipstick: NEGATIVE
Ketones, ur: NEGATIVE mg/dL
pH: 5.5 (ref 5.0–8.0)

## 2012-03-15 LAB — URINE MICROSCOPIC-ADD ON

## 2012-03-16 MED ORDER — LACTATED RINGERS IV SOLN: INTRAVENOUS | Status: DC

## 2012-03-16 MED ORDER — LACTATED RINGERS IV SOLN
INTRAVENOUS | Status: DC
  Filled 2012-03-16: qty 1000

## 2012-03-16 MED ORDER — CHLORHEXIDINE GLUCONATE 4 % EX LIQD: 60.0000 mL | Freq: Once | CUTANEOUS | Status: DC

## 2012-03-16 MED ORDER — CEFAZOLIN SODIUM 1-5 GM-% IV SOLN: 1.0000 g | INTRAVENOUS | Status: DC

## 2012-03-16 MED ORDER — CEFAZOLIN SODIUM-DEXTROSE 2-3 GM-% IV SOLR: 2.0000 g | INTRAVENOUS | Status: DC

## 2012-03-20 NOTE — Anesthesia Preprocedure Evaluation (Addendum)
Anesthesia Evaluation  Patient identified by MRN, date of birth, ID band Patient awake    Reviewed: Allergy & Precautions, H&P , NPO status , Patient's Chart, lab work & pertinent test results  History of Anesthesia Complications (+) PONV and AWARENESS UNDER ANESTHESIA  Airway Mallampati: II TM Distance: <3 FB Neck ROM: full    Dental  (+) Dental Advisory Given and Missing,    Pulmonary neg pulmonary ROS,  breath sounds clear to auscultation  Pulmonary exam normal       Cardiovascular Exercise Tolerance: Good negative cardio ROS  Rhythm:regular Rate:Normal     Neuro/Psych negative neurological ROS  negative psych ROS   GI/Hepatic negative GI ROS, Neg liver ROS, GERD-  Medicated and Controlled,  Endo/Other  negative endocrine ROS  Renal/GU negative Renal ROS  negative genitourinary   Musculoskeletal   Abdominal   Peds  Hematology negative hematology ROS (+)   Anesthesia Other Findings   Reproductive/Obstetrics negative OB ROS                        Anesthesia Physical Anesthesia Plan  ASA: II  Anesthesia Plan: General   Post-op Pain Management:    Induction: Intravenous  Airway Management Planned: Oral ETT  Additional Equipment:   Intra-op Plan:   Post-operative Plan: Extubation in OR  Informed Consent: I have reviewed the patients History and Physical, chart, labs and discussed the procedure including the risks, benefits and alternatives for the proposed anesthesia with the patient or authorized representative who has indicated his/her understanding and acceptance.   Dental Advisory Given  Plan Discussed with: CRNA and Surgeon  Anesthesia Plan Comments:         Anesthesia Quick Evaluation

## 2012-03-21 ENCOUNTER — Encounter (HOSPITAL_COMMUNITY): Payer: Self-pay | Admitting: *Deleted

## 2012-03-21 ENCOUNTER — Ambulatory Visit (HOSPITAL_COMMUNITY): Payer: Medicare Other | Admitting: Anesthesiology

## 2012-03-21 ENCOUNTER — Encounter (HOSPITAL_COMMUNITY): Payer: Self-pay | Admitting: Anesthesiology

## 2012-03-21 ENCOUNTER — Ambulatory Visit (HOSPITAL_COMMUNITY): Payer: Medicare Other

## 2012-03-21 ENCOUNTER — Encounter (HOSPITAL_COMMUNITY)
Admission: RE | Disposition: A | Discharge status: Home / Self Care | Payer: Self-pay | Source: Ambulatory Visit | Attending: Specialist

## 2012-03-21 ENCOUNTER — Observation Stay (HOSPITAL_COMMUNITY)
Admission: RE | Admit: 2012-03-21 | Discharge: 2012-03-23 | Disposition: A | Discharge status: Home / Self Care | Payer: Medicare Other | Source: Ambulatory Visit | Attending: Specialist | Admitting: Specialist

## 2012-03-21 DIAGNOSIS — Z79899 Other long term (current) drug therapy: Secondary | ICD-10-CM | POA: Insufficient documentation

## 2012-03-21 DIAGNOSIS — M4180 Other forms of scoliosis, site unspecified: Secondary | ICD-10-CM | POA: Insufficient documentation

## 2012-03-21 DIAGNOSIS — M48061 Spinal stenosis, lumbar region without neurogenic claudication: Principal | ICD-10-CM | POA: Diagnosis present

## 2012-03-21 DIAGNOSIS — M810 Age-related osteoporosis without current pathological fracture: Secondary | ICD-10-CM | POA: Insufficient documentation

## 2012-03-21 DIAGNOSIS — K219 Gastro-esophageal reflux disease without esophagitis: Secondary | ICD-10-CM | POA: Insufficient documentation

## 2012-03-21 LAB — URINALYSIS, ROUTINE W REFLEX MICROSCOPIC
Leukocytes, UA: NEGATIVE
Nitrite: NEGATIVE
Protein, ur: NEGATIVE mg/dL
Urobilinogen, UA: 0.2 mg/dL (ref 0.0–1.0)

## 2012-03-21 LAB — TYPE AND SCREEN
ABO/RH(D): O POS
Antibody Screen: NEGATIVE

## 2012-03-21 LAB — URINE MICROSCOPIC-ADD ON

## 2012-03-21 SURGERY — DECOMPRESSIVE LUMBAR LAMINECTOMY LEVEL 2
Anesthesia: General | Site: Back | Wound class: Clean

## 2012-03-21 MED ORDER — PHENOL 1.4 % MT LIQD: 1.0000 | OROMUCOSAL | Status: DC | PRN

## 2012-03-21 MED ORDER — NEOSTIGMINE METHYLSULFATE 1 MG/ML IJ SOLN
INTRAMUSCULAR | Status: DC | PRN
  Administered 2012-03-21: 40.6 mg via INTRAVENOUS

## 2012-03-21 MED ORDER — CLOTRIMAZOLE 10 MG MT TROC
10.0000 mg | Freq: Every day | OROMUCOSAL | Status: DC
  Filled 2012-03-21 (×3): qty 1

## 2012-03-21 MED ORDER — SODIUM CHLORIDE 0.9 % IJ SOLN: 3.0000 mL | INTRAMUSCULAR | Status: DC | PRN

## 2012-03-21 MED ORDER — HYDROMORPHONE HCL PF 1 MG/ML IJ SOLN
INTRAMUSCULAR | Status: AC
  Filled 2012-03-21: qty 1

## 2012-03-21 MED ORDER — TACROLIMUS 1 MG PO CAPS: 1.0000 mg | ORAL_CAPSULE | Freq: Two times a day (BID) | ORAL | Status: DC

## 2012-03-21 MED ORDER — ONDANSETRON HCL 4 MG/2ML IJ SOLN
INTRAMUSCULAR | Status: DC | PRN
  Administered 2012-03-21: 4 mg via INTRAVENOUS

## 2012-03-21 MED ORDER — ACETAMINOPHEN 10 MG/ML IV SOLN
INTRAVENOUS | Status: DC | PRN
  Administered 2012-03-21: 1000 mg via INTRAVENOUS

## 2012-03-21 MED ORDER — SODIUM CHLORIDE 0.9 % IJ SOLN
3.0000 mL | Freq: Two times a day (BID) | INTRAMUSCULAR | Status: DC
  Administered 2012-03-22: 3 mL via INTRAVENOUS

## 2012-03-21 MED ORDER — THROMBIN 5000 UNITS EX SOLR
CUTANEOUS | Status: AC
  Filled 2012-03-21: qty 10000

## 2012-03-21 MED ORDER — SODIUM CHLORIDE 0.45 % IV SOLN
INTRAVENOUS | Status: DC
  Administered 2012-03-21 – 2012-03-22 (×3): via INTRAVENOUS

## 2012-03-21 MED ORDER — PROPOFOL 10 MG/ML IV EMUL
INTRAVENOUS | Status: DC | PRN
  Administered 2012-03-21: 140 mg via INTRAVENOUS

## 2012-03-21 MED ORDER — ACETAMINOPHEN 650 MG RE SUPP: 650.0000 mg | RECTAL | Status: DC | PRN

## 2012-03-21 MED ORDER — FENTANYL CITRATE 0.05 MG/ML IJ SOLN
INTRAMUSCULAR | Status: DC | PRN
  Administered 2012-03-21: 25 ug via INTRAVENOUS
  Administered 2012-03-21 (×2): 50 ug via INTRAVENOUS

## 2012-03-21 MED ORDER — SODIUM CHLORIDE 0.9 % IV SOLN: 250.0000 mL | INTRAVENOUS | Status: DC

## 2012-03-21 MED ORDER — MORPHINE SULFATE 2 MG/ML IJ SOLN: 1.0000 mg | INTRAMUSCULAR | Status: DC | PRN

## 2012-03-21 MED ORDER — ACETAMINOPHEN 325 MG PO TABS
650.0000 mg | ORAL_TABLET | ORAL | Status: DC | PRN
  Administered 2012-03-21 – 2012-03-23 (×6): 650 mg via ORAL
  Filled 2012-03-21 (×6): qty 2

## 2012-03-21 MED ORDER — BUPIVACAINE-EPINEPHRINE (PF) 0.5% -1:200000 IJ SOLN
INTRAMUSCULAR | Status: AC
  Filled 2012-03-21: qty 10

## 2012-03-21 MED ORDER — THROMBIN 5000 UNITS EX SOLR
CUTANEOUS | Status: DC | PRN
  Administered 2012-03-21: 5000 [IU] via TOPICAL

## 2012-03-21 MED ORDER — MIDAZOLAM HCL 5 MG/5ML IJ SOLN
INTRAMUSCULAR | Status: DC | PRN
  Administered 2012-03-21: 1 mg via INTRAVENOUS

## 2012-03-21 MED ORDER — DEXTROSE 5 % IV SOLN
3.0000 g | INTRAVENOUS | Status: AC
  Administered 2012-03-21: 2 g via INTRAVENOUS

## 2012-03-21 MED ORDER — ACETAMINOPHEN 10 MG/ML IV SOLN
INTRAVENOUS | Status: AC
  Filled 2012-03-21: qty 100

## 2012-03-21 MED ORDER — SODIUM CHLORIDE 0.9 % IR SOLN
Status: DC | PRN
  Administered 2012-03-21: 09:00:00

## 2012-03-21 MED ORDER — BUPIVACAINE-EPINEPHRINE 0.5% -1:200000 IJ SOLN
INTRAMUSCULAR | Status: DC | PRN
  Administered 2012-03-21: 15 mL

## 2012-03-21 MED ORDER — PANTOPRAZOLE SODIUM 40 MG PO TBEC
40.0000 mg | DELAYED_RELEASE_TABLET | Freq: Every day | ORAL | Status: DC
  Administered 2012-03-22 – 2012-03-23 (×2): 40 mg via ORAL
  Filled 2012-03-21 (×3): qty 1

## 2012-03-21 MED ORDER — ONDANSETRON HCL 4 MG/2ML IJ SOLN
4.0000 mg | INTRAMUSCULAR | Status: DC | PRN
Start: 2012-03-21 — End: 2012-03-23

## 2012-03-21 MED ORDER — CEFAZOLIN SODIUM-DEXTROSE 2-3 GM-% IV SOLR
INTRAVENOUS | Status: AC
  Filled 2012-03-21: qty 50

## 2012-03-21 MED ORDER — DEXAMETHASONE SODIUM PHOSPHATE 10 MG/ML IJ SOLN
INTRAMUSCULAR | Status: DC | PRN
  Administered 2012-03-21: 10 mg via INTRAVENOUS

## 2012-03-21 MED ORDER — ROCURONIUM BROMIDE 100 MG/10ML IV SOLN
INTRAVENOUS | Status: DC | PRN
  Administered 2012-03-21: 30 mg via INTRAVENOUS

## 2012-03-21 MED ORDER — LACTATED RINGERS IV SOLN
INTRAVENOUS | Status: DC
  Administered 2012-03-21: 07:00:00 via INTRAVENOUS

## 2012-03-21 MED ORDER — LIDOCAINE HCL (CARDIAC) 20 MG/ML IV SOLN
INTRAVENOUS | Status: DC | PRN
  Administered 2012-03-21: 60 mg via INTRAVENOUS

## 2012-03-21 MED ORDER — HYDROMORPHONE HCL PF 1 MG/ML IJ SOLN
0.2500 mg | INTRAMUSCULAR | Status: DC | PRN
  Administered 2012-03-21 (×2): 0.5 mg via INTRAVENOUS

## 2012-03-21 MED ORDER — LACTATED RINGERS IV SOLN: INTRAVENOUS | Status: DC

## 2012-03-21 MED ORDER — CEFAZOLIN SODIUM 1-5 GM-% IV SOLN
1.0000 g | Freq: Three times a day (TID) | INTRAVENOUS | Status: AC
  Administered 2012-03-21 (×2): 1 g via INTRAVENOUS
  Filled 2012-03-21 (×2): qty 50

## 2012-03-21 MED ORDER — DOCUSATE SODIUM 100 MG PO CAPS
100.0000 mg | ORAL_CAPSULE | Freq: Two times a day (BID) | ORAL | Status: DC
  Administered 2012-03-21 – 2012-03-23 (×4): 100 mg via ORAL

## 2012-03-21 MED ORDER — MENTHOL 3 MG MT LOZG: 1.0000 | LOZENGE | OROMUCOSAL | Status: DC | PRN

## 2012-03-21 MED ORDER — OXYCODONE-ACETAMINOPHEN 5-325 MG PO TABS: 1.0000 | ORAL_TABLET | ORAL | Status: DC | PRN

## 2012-03-21 MED ORDER — HYDROCODONE-ACETAMINOPHEN 5-325 MG PO TABS: 1.0000 | ORAL_TABLET | ORAL | Status: DC | PRN

## 2012-03-21 SURGICAL SUPPLY — 50 items
APL SKNCLS STERI-STRIP NONHPOA (GAUZE/BANDAGES/DRESSINGS) ×1
BAG SPEC THK2 15X12 ZIP CLS (MISCELLANEOUS) ×1
BAG ZIPLOCK 12X15 (MISCELLANEOUS) ×2 IMPLANT
BENZOIN TINCTURE PRP APPL 2/3 (GAUZE/BANDAGES/DRESSINGS) ×2 IMPLANT
CHLORAPREP W/TINT 26ML (MISCELLANEOUS) IMPLANT
CLEANER TIP ELECTROSURG 2X2 (MISCELLANEOUS) ×2 IMPLANT
CLOTH BEACON ORANGE TIMEOUT ST (SAFETY) ×2 IMPLANT
DECANTER SPIKE VIAL GLASS SM (MISCELLANEOUS) ×2 IMPLANT
DRAPE MICROSCOPE LEICA (MISCELLANEOUS) ×2 IMPLANT
DRAPE POUCH INSTRU U-SHP 10X18 (DRAPES) ×2 IMPLANT
DRAPE SURG 17X11 SM STRL (DRAPES) ×2 IMPLANT
DRSG EMULSION OIL 3X3 NADH (GAUZE/BANDAGES/DRESSINGS) ×1 IMPLANT
DRSG PAD ABDOMINAL 8X10 ST (GAUZE/BANDAGES/DRESSINGS) IMPLANT
DRSG TELFA 4X5 ISLAND ADH (GAUZE/BANDAGES/DRESSINGS) IMPLANT
DURAFORM SPONGE 2X2 SINGLE (Neuro Prosthesis/Implant) ×1 IMPLANT
DURAPREP 26ML APPLICATOR (WOUND CARE) ×2 IMPLANT
ELECT REM PT RETURN 9FT ADLT (ELECTROSURGICAL) ×2
ELECTRODE REM PT RTRN 9FT ADLT (ELECTROSURGICAL) ×1 IMPLANT
GLOVE BIOGEL PI IND STRL 8 (GLOVE) ×1 IMPLANT
GLOVE BIOGEL PI INDICATOR 8 (GLOVE) ×1
GLOVE ECLIPSE 6.5 STRL STRAW (GLOVE) ×2 IMPLANT
GLOVE INDICATOR 6.5 STRL GRN (GLOVE) ×2 IMPLANT
GLOVE SURG SS PI 8.0 STRL IVOR (GLOVE) ×4 IMPLANT
GOWN PREVENTION PLUS LG XLONG (DISPOSABLE) ×2 IMPLANT
GOWN STRL REIN XL XLG (GOWN DISPOSABLE) ×3 IMPLANT
KIT BASIN OR (CUSTOM PROCEDURE TRAY) ×2 IMPLANT
KIT POSITIONING SURG ANDREWS (MISCELLANEOUS) ×2 IMPLANT
MANIFOLD NEPTUNE II (INSTRUMENTS) ×2 IMPLANT
NDL SPNL 18GX3.5 QUINCKE PK (NEEDLE) ×3 IMPLANT
NEEDLE SPNL 18GX3.5 QUINCKE PK (NEEDLE) ×6 IMPLANT
PATTIES SURGICAL .5 X.5 (GAUZE/BANDAGES/DRESSINGS) ×1 IMPLANT
PATTIES SURGICAL .75X.75 (GAUZE/BANDAGES/DRESSINGS) IMPLANT
PATTIES SURGICAL 1X1 (DISPOSABLE) IMPLANT
SPONGE GAUZE 4X4 12PLY (GAUZE/BANDAGES/DRESSINGS) ×1 IMPLANT
SPONGE SURGIFOAM ABS GEL 100 (HEMOSTASIS) ×2 IMPLANT
STAPLER VISISTAT (STAPLE) ×1 IMPLANT
STRIP CLOSURE SKIN 1/2X4 (GAUZE/BANDAGES/DRESSINGS) IMPLANT
SUT PROLENE 3 0 PS 2 (SUTURE) IMPLANT
SUT VIC AB 0 CT1 27 (SUTURE)
SUT VIC AB 0 CT1 27XBRD ANTBC (SUTURE) IMPLANT
SUT VIC AB 1 CT1 27 (SUTURE) ×2
SUT VIC AB 1 CT1 27XBRD ANTBC (SUTURE) ×1 IMPLANT
SUT VIC AB 1-0 CT2 27 (SUTURE) IMPLANT
SUT VIC AB 2-0 CT1 27 (SUTURE) ×2
SUT VIC AB 2-0 CT1 TAPERPNT 27 (SUTURE) ×1 IMPLANT
SUT VICRYL 0 UR6 27IN ABS (SUTURE) IMPLANT
SYR CONTROL 10ML LL (SYRINGE) ×2 IMPLANT
TAPE CLOTH 4X10 WHT NS (GAUZE/BANDAGES/DRESSINGS) ×1 IMPLANT
TRAY LAMINECTOMY (CUSTOM PROCEDURE TRAY) ×2 IMPLANT
YANKAUER SUCT BULB TIP NO VENT (SUCTIONS) ×2 IMPLANT

## 2012-03-21 NOTE — Preoperative (Signed)
Beta Blockers   Reason not to administer Beta Blockers:Not Applicable 

## 2012-03-21 NOTE — Anesthesia Postprocedure Evaluation (Signed)
  Anesthesia Post-op Note  Patient: Linda Watson  Procedure(s) Performed: Procedure(s) (LRB): DECOMPRESSIVE LUMBAR LAMINECTOMY LEVEL 2 (N/A)  Patient Location: PACU  Anesthesia Type: General  Level of Consciousness: awake and alert   Airway and Oxygen Therapy: Patient Spontanous Breathing  Post-op Pain: mild  Post-op Assessment: Post-op Vital signs reviewed, Patient's Cardiovascular Status Stable, Respiratory Function Stable, Patent Airway and No signs of Nausea or vomiting  Post-op Vital Signs: stable  Complications: No apparent anesthesia complications

## 2012-03-21 NOTE — Interval H&P Note (Signed)
History and Physical Interval Note:  03/21/2012 7:19 AM  Linda Watson  has presented today for surgery, with the diagnosis of stenosis Lumbar 4-Sacral 1   The various methods of treatment have been discussed with the patient and family. After consideration of risks, benefits and other options for treatment, the patient has consented to  Procedure(s) (LRB): DECOMPRESSIVE LUMBAR LAMINECTOMY LEVEL 2 (N/A) as a surgical intervention .  The patient's history has been reviewed, patient examined, no change in status, stable for surgery.  I have reviewed the patient's chart and labs.  Questions were answered to the patient's satisfaction.     Benjamine Strout C

## 2012-03-21 NOTE — Brief Op Note (Signed)
03/21/2012  9:38 AM  PATIENT:  Linda Watson  76 y.o. female  PRE-OPERATIVE DIAGNOSIS:  stenosis Lumbar 4-Sacral 1   POST-OPERATIVE DIAGNOSIS:  stenosis L4-S1  PROCEDURE:  Procedure(s) (LRB): DECOMPRESSIVE LUMBAR LAMINECTOMY LEVEL 2 (N/A)  SURGEON:  Surgeon(s) and Role:    * Javier Docker, MD - Primary    * Drucilla Schmidt, MD - Assisting  PHYSICIAN ASSISTANT:   ASSISTANTS: [place]   ANESTHESIA:   general  EBL:  Total I/O In: 1000 [I.V.:1000] Out: 275 [Urine:175; Blood:100]  BLOOD ADMINISTERED:none  DRAINS: none   LOCAL MEDICATIONS USED:  MARCAINE     SPECIMEN:  No Specimen  DISPOSITION OF SPECIMEN:  N/A  COUNTS:  YES  TOURNIQUET:  * No tourniquets in log *  DICTATION: .Other Dictation: Dictation Number [id number]  PLAN OF CARE: Admit for overnight observation  PATIENT DISPOSITION:  PACU - hemodynamically stable.   Delay start of Pharmacological VTE agent (>24hrs) due to surgical blood loss or risk of bleeding: yes

## 2012-03-21 NOTE — H&P (Signed)
Linda Watson is an 76 y.o. female.   Chief Complaint: Bilateral leg pain HPI: Refractory stenosis bilat L45, HnP L5S1  Past Medical History  Diagnosis Date  . Lichen planus     LESIONS  IN MOUTH AND OCCASIONALLY GENITAL AREA  . GERD (gastroesophageal reflux disease)   . Spinal stenosis     PAIN IN LOWER BACK AND DOWN LEFT LEG -ALSO HAS NUMBNESS AND TINGLING IN LEFT LEG TO FOOT  . Arthritis     RIGHT KNEE PAIN AND ARTHRITIS  . PONV (postoperative nausea and vomiting)   . Fracture, patella     01/21/12 - immobilizer x one month     Past Surgical History  Procedure Date  . Abdominal hysterectomy   . Left shoulder surgery   . Bilateral knee arthroscopies   . Hiatal hernia repair     History reviewed. No pertinent family history. Social History:  reports that she has never smoked. She has never used smokeless tobacco. She reports that she does not drink alcohol or use illicit drugs.  Allergies: No Known Allergies  Medications Prior to Admission  Medication Sig Dispense Refill  . Acetaminophen 500 MG coapsule Take 2 capsules by mouth See admin instructions. PT ALWAYS TAKES ONE AT BEDTIME FOR BACK PAIN--MIGHT TAKE ONE DURING THE DAY IF NEEDED FOR PAIN      . clotrimazole (MYCELEX) 10 MG troche Take 10 mg by mouth daily.      . fluocinonide cream (LIDEX) 0.05 % Apply 1 application topically daily as needed. rash      . pantoprazole (PROTONIX) 40 MG tablet Take 40 mg by mouth daily with breakfast.      . tacrolimus (PROGRAF) 1 MG capsule Take 1 mg by mouth See admin instructions. Mix 1 capsule with 1 liter of water and rinse and spit twice daily      . valACYclovir (VALTREX) 500 MG tablet Take 500 mg by mouth daily as needed. For rash      . meloxicam (MOBIC) 15 MG tablet Take 15 mg by mouth daily with breakfast.      . methotrexate (RHEUMATREX) 2.5 MG tablet Take 2.5 mg by mouth once a week. On Tuesday. Caution:Chemotherapy. Protect from light. For treatment of lichen planus of  mouth        Results for orders placed during the hospital encounter of 03/21/12 (from the past 48 hour(s))  TYPE AND SCREEN     Status: Normal   Collection Time   03/21/12  5:39 AM      Component Value Range Comment   ABO/RH(D) O POS      Antibody Screen NEG      Sample Expiration 03/24/2012     ABO/RH     Status: Normal   Collection Time   03/21/12  6:00 AM      Component Value Range Comment   ABO/RH(D) O POS      No results found.  Review of Systems  Constitutional: Negative.   HENT: Negative.   Eyes: Negative.   Respiratory: Negative.   Cardiovascular: Negative.   Gastrointestinal: Negative.   Genitourinary: Negative.   Musculoskeletal: Negative.   Skin: Negative.   Neurological: Positive for sensory change and focal weakness.  Endo/Heme/Allergies: Negative.   Psychiatric/Behavioral: Negative.   All other systems reviewed and are negative.    Blood pressure 119/69, pulse 67, temperature 97.6 F (36.4 C), temperature source Oral, resp. rate 18, height 5\' 4"  (1.626 m), weight 75.751 kg (167 lb), SpO2 96.00%.  Physical Exam  Vitals reviewed. Constitutional: She is oriented to person, place, and time. She appears well-developed.  HENT:  Head: Normocephalic.  Eyes: Pupils are equal, round, and reactive to light.  Neck: Normal range of motion.  Cardiovascular: Normal rate and regular rhythm.   Respiratory: Effort normal.  GI: Soft.  Musculoskeletal:       +SLR bilat. EHL 5-/5 bilat. 2+ pulses. No DVT. Quad 5-/5 left.  Neurological: She is alert and oriented to person, place, and time.  Skin: Skin is warm and dry.  Psychiatric: She has a normal mood and affect.     Assessment/Plan Refractory stenosis HNP L45 L5S1. Plan decompression. Risks discussed. No Sx UTI.  Linda Watson C 03/21/2012, 7:21 AM

## 2012-03-21 NOTE — Transfer of Care (Signed)
Immediate Anesthesia Transfer of Care Note  Patient: Linda Watson  Procedure(s) Performed: Procedure(s) (LRB): DECOMPRESSIVE LUMBAR LAMINECTOMY LEVEL 2 (N/A)  Patient Location: PACU  Anesthesia Type: General  Level of Consciousness: awake, alert , oriented and patient cooperative  Airway & Oxygen Therapy: Patient Spontanous Breathing and Patient connected to face mask oxygen  Post-op Assessment: Report given to PACU RN, Post -op Vital signs reviewed and stable and Patient moving all extremities  Post vital signs: Reviewed and stable  Complications: No apparent anesthesia complications

## 2012-03-22 LAB — CBC
HCT: 32 % — ABNORMAL LOW (ref 36.0–46.0)
Platelets: 232 10*3/uL (ref 150–400)
RBC: 3.65 MIL/uL — ABNORMAL LOW (ref 3.87–5.11)
RDW: 14.9 % (ref 11.5–15.5)
WBC: 10.8 10*3/uL — ABNORMAL HIGH (ref 4.0–10.5)

## 2012-03-22 LAB — BASIC METABOLIC PANEL
Chloride: 106 mEq/L (ref 96–112)
Creatinine, Ser: 0.69 mg/dL (ref 0.50–1.10)
GFR calc Af Amer: >90 mL/min (ref >90)
Sodium: 139 mEq/L (ref 135–145)

## 2012-03-22 MED ORDER — METHOCARBAMOL 500 MG PO TABS: 500.0000 mg | ORAL_TABLET | Freq: Three times a day (TID) | ORAL | Status: AC

## 2012-03-22 MED ORDER — HYDROCODONE-ACETAMINOPHEN 5-325 MG PO TABS: 1.0000 | ORAL_TABLET | ORAL | Status: AC | PRN

## 2012-03-22 NOTE — Op Note (Signed)
Linda Watson, Linda Watson NO.:  [id number]  MEDICAL RECORD NO.:  [id number]  LOCATION:  1605                         FACILITY:  [facility]  PHYSICIAN:  Jene Every, M.D.    DATE OF BIRTH:  1936/03/02  DATE OF PROCEDURE: DATE OF DISCHARGE:                              OPERATIVE REPORT   PREOPERATIVE DIAGNOSIS:  Spinal stenosis at L4-5 and L5-1.  POSTOPERATIVE DIAGNOSIS:  Spinal stenosis at L4-L and L5-1, attenuation of thecal sac L5-S1.  PROCEDURE PERFORMED: 1. Lumbar decompression at L4-5 and L5-S1. 2. Bilateral foraminotomies at L4, L5, S1. 3. Application of DuraGen to attenuate thecal sac.  ANESTHESIA:  General.  ASSISTANT:  Marlowe Kays, M.D.  HISTORY:  This is a 76 year old gentleman with left lower extremity radicular pain secondary to severe lateral recess stenosis at 4-5 multifactorial and at 5-1.  Refractory to conservative treatment with epidural steroid injection, activity modification, continuous symptoms, neural tension signs, EHL weakness, __________ quad weakness.  She was indicated for lumbar decompression of 4-5 and 5-1.  She had adjacent scoliosis, multilevel degeneration.  She had a slight scoliosis with minimal listhesis at 4-5.  No instability noted on flexion, extension. Had no back pain.  She was indicated for decompression.  Risk and benefits discussed including bleeding, infection, damage to the vascular structures, CSF leakage, epidural fibrosis, adjacent segment disease, need for fusion in future, DVT, PE, anesthetic complications, etc.  TECHNIQUE:  With the patient in supine position, after induction of adequate general anesthesia and a gram of Kefzol, placed prone on the Vass frame.  All bony prominences were well padded.  Lumbar region was prepped and draped in usual sterile fashion.  The patient __________ 3 months ago patella fracture that was healed.  We placed extra padding underneath the left knee.  The left knee was  examined.  She had range of 0-120.  Localization needles were utilized to localize 4-5 and 5-1. Next, incision was made from spinous process __________ subcutaneous tissue was dissected by electrocautery to achieve hemostasis. Dorsolumbar fascia identified, divided in line of skin incision. Paraspinous muscle elevated from lamina 4-5 and 5-1.  McCullough retractor was placed.  Kocher was placed on the spinous processes of 4 and 5 confirmed by x-ray.  Spinous processes of 5 and partially 4 and S1 were removed with Leksell rongeur.  Osteoporosis was noted.  There was no gross instability at 4-5 noted at the facets with testing of the spinous processes.  Operating microscope was draped and brought into the surgical field.  We performed a central decompression at 4-5 first utilizing 2-mm Kerrison, performed hemilaminotomy of L4-5 bilaterally. I detached ligamentum flavum preserving the pars.  There was hypertrophic ligamentum flavum noted and severe lateral recess stenosis noted bilaterally here.  We decompressed lateral recess to the medial border of the pedicle.  We undercut the facets preserving the facets. Severe stenosis was noted, left greater than right.  Foraminotomies of 4 and 5 were performed.  Bipolar electrocautery was utilized to achieve hemostasis.  There was no disk herniation noted.  Continued decompression caudad, performed foraminotomies of 5 and hemilaminectomies of 5 and 4, laminectomies of 5.  We removed ligamentum flavum from the interspace  at 5-1, placed a Neuro-Patty beneath the ligamentum flavum at all times.  Ligamentum flavum removed at 5-1. Attenuation was noted at the thecal sac dorsolaterally.  On the right, there was remnants of corticosteroid injection.  __________ material noted in this region as well.  No active CSF leakage.  Protected that and decompressed the lateral recess of the medial border pedicle, performed foraminotomies of S1 bilaterally as well.   Protecting the disk, there was no herniated disk, it was a hard disk left and right. Following decompression, hockey-stick probe passed freely at the foramen 4, 5 and S1.  Copiously irrigated, used bone wax on the cancellous surfaces.  The attenuated area distally was about 1 x 0.5 cm.  I placed a DuraGen patch over that region, I felt will be appropriate.  Following this, performed Valsalva.  There was no CSF leakage or active bleeding. Copiously irrigated.  Thrombin-soaked Gelfoam was also placed proximally as well.  McCullough retractor was removed.  Paraspinous muscles inspected.  No evidence of active bleeding.  Dorsolumbar fascia was then reapproximated with 1 Vicryl interrupted figure-of-eight sutures subcu with 2-0 Vicryl simple sutures.  Skin was reapproximated with staples. Wound was dressed sterilely.  Placed supine on hospital bed, extubated without difficulty, and transported to the recovery room in satisfactory condition.  The patient tolerated the procedure well.  No complications.  BLOOD LOSS:  30 mL.  __________ radiograph obtained, finally joint 4-5 and 5-1 have been compressed.     Jene Every, M.D.     Cordelia Pen  D:  03/21/2012  T:  03/22/2012  Job:  [id number]

## 2012-03-22 NOTE — Progress Notes (Signed)
Subjective: 1 Day Post-Op Procedure(s) (LRB): DECOMPRESSIVE LUMBAR LAMINECTOMY LEVEL 2 (N/A) Patient reports pain as 2 on 0-10 scale.    Objective: Vital signs in last 24 hours: Temp:  [97.6 F (36.4 C)-98.6 F (37 C)] 98.6 F (37 C) (07/25 0625) Pulse Rate:  [55-84] 65  (07/25 0625) Resp:  [14-20] 14  (07/25 0625) BP: (88-125)/(49-73) 92/52 mmHg (07/25 0625) SpO2:  [96 %-100 %] 98 % (07/25 0625) FiO2 (%):  [98 %] 98 % (07/24 1045) Weight:  [75.751 kg (167 lb)] 75.751 kg (167 lb) (07/24 1045)  Intake/Output from previous day: 07/24 0701 - 07/25 0700 In: 3861.3 [P.O.:1080; I.V.:2781.3] Out: 3175 [Urine:3075; Blood:100] Intake/Output this shift: Total I/O In: 930 [P.O.:600; I.V.:330] Out: 2000 [Urine:2000]   Basename 03/22/12 0402  HGB 10.7*    Basename 03/22/12 0402  WBC 10.8*  RBC 3.65*  HCT 32.0*  PLT 232    Basename 03/22/12 0402  NA 139  K 4.0  CL 106  CO2 26  BUN 11  CREATININE 0.69  GLUCOSE 104*  CALCIUM 8.6   No results found for this basename: LABPT:2,INR:2 in the last 72 hours  Neurologically intact Neurovascular intact Sensation intact distally Intact pulses distally Dorsiflexion/Plantar flexion intact. Dsg with sl bloody stain. Assessment/Plan: 1 Day Post-Op Procedure(s) (LRB): DECOMPRESSIVE LUMBAR LAMINECTOMY LEVEL 2 (N/A) Advance diet Up with therapy Plan for discharge tomorrow. DC foley, PT.  Kariyah Baugh C 03/22/2012, 7:00 AM

## 2012-03-22 NOTE — Evaluation (Signed)
Physical Therapy Evaluation Patient Details [name] MRN: [id number] DOB: Dec 30, 1935 Today's Date: 03/22/2012 Time: 7829-5621 PT Time Calculation (min): 15 min  PT Assessment / Plan / Recommendation Clinical Impression  Pt s/p lumbar decompression L4-5 and L5-S1.  Pt educated on back precautions and given handout.  Pt would benefit from acute PT services to maximize mobility prior to returning home alone.  Pt reports son is a phone call away if needed.  Pt will need to practice stairs prior to d/c as she lives in 2nd floor apt.    PT Assessment  Patient needs continued PT services    Follow Up Recommendations  No PT follow up    Barriers to Discharge        Equipment Recommendations  None recommended by PT    Recommendations for Other Services     Frequency Min 5X/week    Precautions / Restrictions Precautions Precautions: Back Precaution Comments: Pt given back handout  Demonstrated and educated in all back precautions.  All reviewed pillow placement for sleeping positions.   Pertinent Vitals/Pain 1/10 low back pain, premedicated with tylenol, repositioned      Mobility  Bed Mobility Bed Mobility: Rolling Right;Right Sidelying to Sit Rolling Right: 5: Supervision Right Sidelying to Sit: 5: Supervision;HOB flat;With rails Details for Bed Mobility Assistance: verbal cues for log roll technique, pt required use of rail Transfers Transfers: Sit to Stand;Stand to Sit Sit to Stand: 4: Min guard;From bed Stand to Sit: 4: Min guard;To chair/3-in-1 Details for Transfer Assistance: verbal cues for back precautions Ambulation/Gait Ambulation/Gait Assistance: 4: Min guard Ambulation Distance (Feet): 180 Feet Assistive device: None Ambulation/Gait Assistance Details: pt ambulated close to hand rail for comfort however no UE needed Gait Pattern: Decreased trunk rotation;Decreased stride length;Step-through pattern Gait velocity: decreased    Exercises     PT  Diagnosis: Difficulty walking  PT Problem List: Decreased activity tolerance;Decreased mobility;Decreased knowledge of precautions;Decreased knowledge of use of DME PT Treatment Interventions: DME instruction;Gait training;Functional mobility training;Therapeutic activities;Stair training;Therapeutic exercise;Patient/family education   PT Goals Acute Rehab PT Goals PT Goal Formulation: With patient Time For Goal Achievement: 03/29/12 Potential to Achieve Goals: Good Pt will go Supine/Side to Sit: with modified independence PT Goal: Supine/Side to Sit - Progress: Goal set today Pt will go Sit to Supine/Side: with modified independence PT Goal: Sit to Supine/Side - Progress: Goal set today Pt will go Sit to Stand: with modified independence PT Goal: Sit to Stand - Progress: Goal set today Pt will go Stand to Sit: with modified independence PT Goal: Stand to Sit - Progress: Goal set today Pt will Ambulate: >150 feet;with modified independence PT Goal: Ambulate - Progress: Goal set today Pt will Go Up / Down Stairs: Flight;with modified independence PT Goal: Up/Down Stairs - Progress: Goal set today  Visit Information  Last PT Received On: 03/22/12 Assistance Needed: +1    Subjective Data  Subjective: I'm just getting over a broken knee.  (patella fx per chart)   Prior Functioning  Home Living Lives With: Alone Type of Home: Apartment Home Access: Stairs to enter Secretary/administrator of Steps: 10-12 Entrance Stairs-Rails: Can reach both Home Layout: One level Home Adaptive Equipment: Walker - standard Prior Function Level of Independence: Independent Communication Communication: No difficulties    Cognition  Overall Cognitive Status: Appears within functional limits for tasks assessed/performed Arousal/Alertness: Awake/alert Orientation Level: Appears intact for tasks assessed Behavior During Session: [facility] for tasks performed    Extremity/Trunk Assessment Right Upper  Extremity Assessment RUE ROM/Strength/Tone: [facility] for tasks assessed Left Upper Extremity Assessment LUE ROM/Strength/Tone: [facility] for tasks assessed Right Lower Extremity Assessment RLE ROM/Strength/Tone: WFL for tasks assessed RLE Sensation: WFL - Light Touch Left Lower Extremity Assessment LLE ROM/Strength/Tone: WFL for tasks assessed LLE Sensation: WFL - Light Touch   Balance    End of Session PT - End of Session Activity Tolerance: Patient tolerated treatment well Patient left: in chair;with call bell/phone within reach Nurse Communication: Other (comment) (pt request bandage/dressing change due to leaking)  GP Functional Assessment Tool Used: clinical judgement Functional Limitation: Mobility: Walking and moving around Mobility: Walking and Moving Around Current Status (V7846): At least 1 percent but less than 20 percent impaired, limited or restricted Mobility: Walking and Moving Around Goal Status [phone]): 0 percent impaired, limited or restricted   Linda Watson,Linda Watson 03/22/2012, 9:44 AM Pager: [phone]

## 2012-03-22 NOTE — Evaluation (Signed)
Occupational Therapy Evaluation Patient Details [name] MRN: [id number] DOB: 1935-09-20 Today's Date: 03/22/2012 Time: 4098-1191 OT Time Calculation (min): 20 min  OT Assessment / Plan / Recommendation Clinical Impression  This 76 year old female was admitted for lumbar microdisc. She will benefit from skilled OT to reinforce education for safe d/c home    OT Assessment  Patient needs continued OT Services    Follow Up Recommendations  Home health OT    Barriers to Discharge      Equipment Recommendations  None recommended by OT;None recommended by PT    Recommendations for Other Services    Frequency  Min 2X/week    Precautions / Restrictions Precautions Precautions: Back   Pertinent Vitals/Pain Back sore; repositioned    ADL  Eating/Feeding: Simulated;Independent Where Assessed - Eating/Feeding: Chair Grooming: Simulated;Supervision/safety Where Assessed - Grooming: Supported standing Upper Body Bathing: Simulated;Set up Where Assessed - Upper Body Bathing: Unsupported sitting Lower Body Bathing: Simulated;Minimal assistance Where Assessed - Lower Body Bathing: Unsupported sit to stand Upper Body Dressing: Simulated;Set up Where Assessed - Upper Body Dressing: Unsupported sitting Lower Body Dressing: Simulated;Minimal assistance Where Assessed - Lower Body Dressing: Unsupported sit to stand Toilet Transfer: Performed;Min guard (ambulate) Toilet Transfer Equipment: Comfort height toilet Toileting - Clothing Manipulation and Hygiene: Simulated;Supervision/safety Where Assessed - Toileting Clothing Manipulation and Hygiene: Standing Transfers/Ambulation Related to ADLs: min guard ambulation and bed mobility:  min cues for back precautions ADL Comments: educated on use of reacher vs. lying in bed to don pants.  Pt will wear slip on shoes    OT Diagnosis: Generalized weakness  OT Problem List: Decreased strength;Decreased knowledge of  precautions;Pain;Decreased knowledge of use of DME or AE OT Treatment Interventions: Self-care/ADL training;Patient/family education;DME and/or AE instruction   OT Goals Acute Rehab OT Goals OT Goal Formulation: With patient Time For Goal Achievement: 03/29/12 Potential to Achieve Goals: Good ADL Goals Pt Will Perform Lower Body Bathing: with supervision;Sit to stand from chair;with adaptive equipment (reacher vs. washing legs supine) ADL Goal: Lower Body Bathing - Progress: Goal set today Pt Will Perform Lower Body Dressing: with supervision;Sit to stand from chair;with adaptive equipment (reacher vs. supine) ADL Goal: Lower Body Dressing - Progress: Goal set today Pt Will Transfer to Toilet: with supervision;Ambulation;Regular height toilet (all aspects) ADL Goal: Toilet Transfer - Progress: Goal set today  Visit Information  Last OT Received On: 03/22/12 Assistance Needed: +1    Subjective Data  Subjective: I'm getting over breaking this leg (R) Patient Stated Goal: be independent   Prior Functioning  Vision/Perception  Home Living Lives With: Alone Bathroom Shower/Tub: Tub/shower unit (will sponge bathe) Bathroom Toilet: Standard Additional Comments: discussed sitting on commode backwards; sink next to it also Prior Function Level of Independence: Independent Communication Communication: No difficulties      Cognition  Overall Cognitive Status: Appears within functional limits for tasks assessed/performed Behavior During Session: [facility] for tasks performed    Extremity/Trunk Assessment Right Upper Extremity Assessment RUE ROM/Strength/Tone: [facility] for tasks assessed Left Upper Extremity Assessment LUE ROM/Strength/Tone: WFL for tasks assessed   Mobility Bed Mobility Sit to Sidelying Left: 4: Min assist Transfers Sit to Stand: 4: Min guard;From chair/3-in-1 Details for Transfer Assistance: min vcs for technique   Exercise    Balance    End of Session OT - End of  Session Activity Tolerance: Patient tolerated treatment well Patient left: in bed;with call bell/phone within reach  GO Functional Assessment Tool Used: clinical judgment Functional Limitation: Self care  Self Care Current Status [phone]): At least 1 percent but less than 20 percent impaired, limited or restricted Self Care Goal Status (A2130): 0 percent impaired, limited or restricted   Ramil Edgington 03/22/2012, 2:54 PM Marica Otter, OTR/L [phone] 03/22/2012

## 2012-03-23 MED ORDER — HYDROCODONE-ACETAMINOPHEN 5-325 MG PO TABS: 1.0000 | ORAL_TABLET | Freq: Four times a day (QID) | ORAL | Status: AC | PRN

## 2012-03-23 NOTE — Discharge Summary (Addendum)
Physician Discharge Summary   Patient ID: Linda Watson MRN: [id number] DOB/AGE: 30-Jan-1936 76 y.o.  Admit date: 03/21/2012 Discharge date: 03/23/2012  Primary Diagnosis:   stenosis Lumbar 4-Sacral 1   Admission Diagnoses:  Past Medical History  Diagnosis Date  . Lichen planus     LESIONS  IN MOUTH AND OCCASIONALLY GENITAL AREA  . GERD (gastroesophageal reflux disease)   . Spinal stenosis     PAIN IN LOWER BACK AND DOWN LEFT LEG -ALSO HAS NUMBNESS AND TINGLING IN LEFT LEG TO FOOT  . Arthritis     RIGHT KNEE PAIN AND ARTHRITIS  . PONV (postoperative nausea and vomiting)   . Fracture, patella     01/21/12 - immobilizer x one month    Discharge Diagnoses:   Principal Problem:  *Lumbar spinal stenosis  Procedure:  Procedure(s) (LRB): DECOMPRESSIVE LUMBAR LAMINECTOMY LEVEL 2 (N/A)   Consults: None  HPI:  tolerated surgery well. No leg pain.    Laboratory Data: Hospital Outpatient Visit on 03/15/2012  Component Date Value Range Status  . WBC 03/15/2012 8.6  4.0 - 10.5 K/uL Final  . RBC 03/15/2012 4.46  3.87 - 5.11 MIL/uL Final  . Hemoglobin 03/15/2012 13.1  12.0 - 15.0 g/dL Final  . HCT 11/91/4782 39.9  36.0 - 46.0 % Final  . MCV 03/15/2012 89.5  78.0 - 100.0 fL Final  . MCH 03/15/2012 29.4  26.0 - 34.0 pg Final  . MCHC 03/15/2012 32.8  30.0 - 36.0 g/dL Final  . RDW 95/62/1308 15.1  11.5 - 15.5 % Final  . Platelets 03/15/2012 302  150 - 400 K/uL Final  . Sodium 03/15/2012 137  135 - 145 mEq/L Final  . Potassium 03/15/2012 4.3  3.5 - 5.1 mEq/L Final  . Chloride 03/15/2012 101  96 - 112 mEq/L Final  . CO2 03/15/2012 28  19 - 32 mEq/L Final  . Glucose, Bld 03/15/2012 92  70 - 99 mg/dL Final  . BUN 65/78/4696 19  6 - 23 mg/dL Final  . Creatinine, Ser 03/15/2012 0.73  0.50 - 1.10 mg/dL Final  . Calcium 29/52/8413 9.1  8.4 - 10.5 mg/dL Final  . Total Protein 03/15/2012 7.0  6.0 - 8.3 g/dL Final  . Albumin 24/40/1027 3.9  3.5 - 5.2 g/dL Final  . AST 25/36/6440 22  0 -  37 U/L Final  . ALT 03/15/2012 15  0 - 35 U/L Final  . Alkaline Phosphatase 03/15/2012 93  39 - 117 U/L Final  . Total Bilirubin 03/15/2012 0.6  0.3 - 1.2 mg/dL Final  . GFR calc non Af Amer 03/15/2012 82* >90 mL/min Final  . GFR calc Af Amer 03/15/2012 >90  >90 mL/min Final   Comment:                                 The eGFR has been calculated                          using the CKD EPI equation.                          This calculation has not been                          validated in all clinical  situations.                          eGFR's persistently                          <90 mL/min signify                          possible Chronic Kidney Disease.  Marland Kitchen Prothrombin Time 03/15/2012 13.5  11.6 - 15.2 seconds Final  . INR 03/15/2012 1.01  0.00 - 1.49 Final  . Color, Urine 03/15/2012 YELLOW  YELLOW Final  . APPearance 03/15/2012 CLOUDY* CLEAR Final  . Specific Gravity, Urine 03/15/2012 1.022  1.005 - 1.030 Final  . pH 03/15/2012 5.5  5.0 - 8.0 Final  . Glucose, UA 03/15/2012 NEGATIVE  NEGATIVE mg/dL Final  . Hgb urine dipstick 03/15/2012 NEGATIVE  NEGATIVE Final  . Bilirubin Urine 03/15/2012 NEGATIVE  NEGATIVE Final  . Ketones, ur 03/15/2012 NEGATIVE  NEGATIVE mg/dL Final  . Protein, ur 65/78/4696 NEGATIVE  NEGATIVE mg/dL Final  . Urobilinogen, UA 03/15/2012 0.2  0.0 - 1.0 mg/dL Final  . Nitrite 29/52/8413 NEGATIVE  NEGATIVE Final  . Leukocytes, UA 03/15/2012 LARGE* NEGATIVE Final  . MRSA, PCR 03/15/2012 NEGATIVE  NEGATIVE Final  . Staphylococcus aureus 03/15/2012 NEGATIVE  NEGATIVE Final   Comment:                                 The Xpert SA Assay (FDA                          approved for NASAL specimens                          only), is one component of                          a comprehensive surveillance                          program.  It is not intended                          to diagnose infection nor to                          guide or  monitor treatment.  . WBC, UA 03/15/2012 21-50  <3 WBC/hpf Final  . Bacteria, UA 03/15/2012 FEW* RARE Final    Basename 03/22/12 0402  HGB 10.7*    Basename 03/22/12 0402  WBC 10.8*  RBC 3.65*  HCT 32.0*  PLT 232    Basename 03/22/12 0402  NA 139  K 4.0  CL 106  CO2 26  BUN 11  CREATININE 0.69  GLUCOSE 104*  CALCIUM 8.6   No results found for this basename: LABPT:2,INR:2 in the last 72 hours  X-Rays:Dg Chest 2 View  03/15/2012  *RADIOLOGY REPORT*  Clinical Data: Preoperative respiratory films.  CHEST - 2 VIEW  Comparison: None.  Findings: Lungs are clear.  Heart size is normal.  No pneumothorax or pleural fluid.  Convex left lumbar scoliosis is partially visualized.  IMPRESSION: No acute finding.  Original Report Authenticated By: Bernadene Bell. D'ALESSIO, M.D.   Dg Lumbar Spine 2-3 Views  03/15/2012  *RADIOLOGY REPORT*  Clinical Data: Low back pain.  LUMBAR SPINE - 2-3 VIEW  Comparison: 01/16/2012  Findings: Leftward convex scoliosis. Anatomic alignment except for minimal 2 mm anterolisthesis L4 on L5.   Advanced disc space narrowing at L3-4, right greater than left, and at L2-3 diffusely. Lower lumbar facet arthropathy.  Mild disc space narrowing L4-5 and L5-S1.  Chronic superior endplate deformity L1 reflecting sclerotic change related to Schmorl's node.  Similar appearance to priors.  IMPRESSION: As above.  Original Report Authenticated By: Elsie Stain, M.D.   Dg Lumbar Spine 1 View  03/21/2012  *RADIOLOGY REPORT*  Clinical Data: Localization film  LUMBAR SPINE - 1 VIEW  Comparison: 03/15/2012  Findings: Portable cross-table lateral film #1 demonstrates a needle directed at the upper sacrum inferiorly, and directed most closely at the L4 spinous process superiorly.  IMPRESSION: As above.  Original Report Authenticated By: Elsie Stain, M.D.   Dg Spine Portable 1 View  03/21/2012  *RADIOLOGY REPORT*  Clinical Data: Intraoperative spinal localization.  PORTABLE SPINE - 1 VIEW   Comparison: Same day  Findings: Third image shows a probe overlying the foraminal level at L4-5 and posterior to the disc level of L5-S1.  IMPRESSION: L4-5 and L5-S1 localized.  Original Report Authenticated By: Thomasenia Sales, M.D.   Dg Spine Portable 1 View  03/21/2012  *RADIOLOGY REPORT*  Clinical Data: Intraoperative localization  PORTABLE SPINE - 1 VIEW  Comparison: None.  Findings: Film #2 demonstrates clamps from a posterior approach overlying the spinous processes of L4 and L5.  IMPRESSION: L4 and L5 marked.  Original Report Authenticated By: Elsie Stain, M.D.    EKG:No orders found for this or any previous visit.   Hospital Course: Patient was admitted to [facility] and taken to the OR and underwent the above state procedure without complications.  Patient tolerated the procedure well and was later transferred to the recovery room and then to the orthopaedic floor for postoperative care.  They were given PO and IV analgesics for pain control following their surgery.  They were given 24 hours of postoperative antibiotics.   PT was consulted postop to assist with mobility and transfers.  The patient was allowed to be WBAT with therapy and was taught back precautions. Discharge planning was consulted to help with postop disposition and equipment needs.  Patient had a good night on the evening of surgery and started to get up OOB with therapy on day one. Patient was seen in rounds and was ready to go home on day one.  They were given discharge instructions and dressing directions.  They were instructed on when to follow up in the office with Dr. Shelle Iron. UA negative on F/U. No Sx  Discharge Medications: Prior to Admission medications   Medication Sig Start Date End Date Taking? Authorizing Provider  clotrimazole (MYCELEX) 10 MG troche Take 10 mg by mouth daily.   Yes Historical Provider, MD  fluocinonide cream (LIDEX) 0.05 % Apply 1 application topically daily as needed. rash   Yes  Historical Provider, MD  pantoprazole (PROTONIX) 40 MG tablet Take 40 mg by mouth daily with breakfast.   Yes Historical Provider, MD  tacrolimus (PROGRAF) 1 MG capsule Take 1 mg by mouth See admin instructions. Mix 1 capsule with 1 liter of water and rinse and spit twice daily   Yes Historical Provider, MD  valACYclovir (VALTREX)  500 MG tablet Take 500 mg by mouth daily as needed. For rash   Yes Historical Provider, MD  HYDROcodone-acetaminophen (NORCO) 5-325 MG per tablet Take 1 tablet by mouth every 6 (six) hours as needed for pain. 03/23/12 04/02/12  Javier Docker, MD  HYDROcodone-acetaminophen (NORCO/VICODIN) 5-325 MG per tablet Take 1-2 tablets by mouth every 4 (four) hours as needed. 03/22/12 04/01/12  Javier Docker, MD  methocarbamol (ROBAXIN) 500 MG tablet Take 1 tablet (500 mg total) by mouth 3 (three) times daily. 03/22/12 04/01/12  Javier Docker, MD    Diet: Regular diet Activity:WBAT Follow-up:in 10 days Disposition - Home Discharged Condition: good Call if any UTI Sx.   Medication List  As of 03/23/2012  7:22 AM   STOP taking these medications         Acetaminophen 500 MG coapsule      meloxicam 15 MG tablet      methotrexate 2.5 MG tablet         TAKE these medications         clotrimazole 10 MG troche   Commonly known as: MYCELEX   Take 10 mg by mouth daily.      fluocinonide cream 0.05 %   Commonly known as: LIDEX   Apply 1 application topically daily as needed. rash      HYDROcodone-acetaminophen 5-325 MG per tablet   Commonly known as: NORCO/VICODIN   Take 1-2 tablets by mouth every 4 (four) hours as needed.      HYDROcodone-acetaminophen 5-325 MG per tablet   Commonly known as: NORCO/VICODIN   Take 1 tablet by mouth every 6 (six) hours as needed for pain.      methocarbamol 500 MG tablet   Commonly known as: ROBAXIN   Take 1 tablet (500 mg total) by mouth 3 (three) times daily.      pantoprazole 40 MG tablet   Commonly known as: PROTONIX   Take 40  mg by mouth daily with breakfast.      tacrolimus 1 MG capsule   Commonly known as: PROGRAF   Take 1 mg by mouth See admin instructions. Mix 1 capsule with 1 liter of water and rinse and spit twice daily      valACYclovir 500 MG tablet   Commonly known as: VALTREX   Take 500 mg by mouth daily as needed. For rash           Follow-up Information    Follow up with Kimmora Risenhoover C, MD in 2 weeks.   Contact information:   [facility] [address] [place] [address] [phone]          Signed: Javier Docker 03/23/2012, 7:22 AM

## 2012-03-23 NOTE — Progress Notes (Signed)
Subjective: 2 Days Post-Op Procedure(s) (LRB): DECOMPRESSIVE LUMBAR LAMINECTOMY LEVEL 2 (N/A) Patient reports pain as 2 on 0-10 scale.    Objective: Vital signs in last 24 hours: Temp:  [97.9 F (36.6 C)-99 F (37.2 C)] 99 F (37.2 C) (07/26 0417) Pulse Rate:  [66-82] 80  (07/26 0417) Resp:  [14-16] 14  (07/26 0417) BP: (92-110)/(61-70) 110/69 mmHg (07/26 0417) SpO2:  [93 %-96 %] 96 % (07/26 0417)  Intake/Output from previous day: 07/25 0701 - 07/26 0700 In: 1923 [P.O.:600; I.V.:1323] Out: 2800 [Urine:2800] Intake/Output this shift:     Basename 03/22/12 0402  HGB 10.7*    Basename 03/22/12 0402  WBC 10.8*  RBC 3.65*  HCT 32.0*  PLT 232    Basename 03/22/12 0402  NA 139  K 4.0  CL 106  CO2 26  BUN 11  CREATININE 0.69  GLUCOSE 104*  CALCIUM 8.6   No results found for this basename: LABPT:2,INR:2 in the last 72 hours  Neurologically intact ABD soft Neurovascular intact Sensation intact distally Dorsiflexion/Plantar flexion intact Incision: dressing C/D/I  Assessment/Plan: 2 Days Post-Op Procedure(s) (LRB): DECOMPRESSIVE LUMBAR LAMINECTOMY LEVEL 2 (N/A) Discharge home with home health. Instructions given. Had slight HA in AM not positional. Goes away after coffee. No sign of spinal HA. Discussed with patient.  Linda Watson C 03/23/2012, 7:13 AM

## 2012-03-23 NOTE — Progress Notes (Signed)
Physical Therapy Treatment Patient Details [name] MRN: [id number] DOB: 04/26/1936 Today's Date: 03/23/2012 Time: 7829-5621 PT Time Calculation (min): 13 min  PT Assessment / Plan / Recommendation Comments on Treatment Session  Pt doing great with mobility and met all goals.  Pt reports her son will assist her home and check on her tonight and she has a friend staying with her tomorrow.  Pt feels ready for d/c home.  Reviewed back precautions with pt as well.    Follow Up Recommendations  No PT follow up    Barriers to Discharge        Equipment Recommendations  None recommended by PT    Recommendations for Other Services    Frequency     Plan All goals met and education completed, patient dischaged from PT services    Precautions / Restrictions Precautions Precautions: Back Restrictions Weight Bearing Restrictions: No   Pertinent Vitals/Pain No pain    Mobility  Bed Mobility Rolling Right: 6: Modified independent (Device/Increase time) Right Sidelying to Sit: 6: Modified independent (Device/Increase time) Transfers Transfers: Stand to Sit;Sit to Stand Sit to Stand: 6: Modified independent (Device/Increase time);From chair/3-in-1 Stand to Sit: 6: Modified independent (Device/Increase time);To chair/3-in-1 Ambulation/Gait Ambulation/Gait Assistance: 6: Modified independent (Device/Increase time) Ambulation Distance (Feet): 200 Feet Assistive device: None Gait Pattern: Step-through pattern;Decreased trunk rotation Stairs: Yes Stairs Assistance: 5: Supervision;6: Modified independent (Device/Increase time) Stairs Assistance Details (indicate cue type and reason): pt performed twice, verbal cue once for sequence due to recent patella fx Stair Management Technique: Step to pattern;Forwards;One rail Right Number of Stairs: 10     Exercises     PT Diagnosis:    PT Problem List:   PT Treatment Interventions:     PT Goals Acute Rehab PT Goals PT Goal:  Supine/Side to Sit - Progress: Met PT Goal: Sit to Supine/Side - Progress: Other (comment) (not observed) PT Goal: Sit to Stand - Progress: Met PT Goal: Stand to Sit - Progress: Met PT Goal: Ambulate - Progress: Met PT Goal: Up/Down Stairs - Progress: Met  Visit Information  Last PT Received On: 03/23/12 Assistance Needed: +1    Subjective Data  Subjective: I think I'm ready to go.  (d/c home today)   Cognition  Overall Cognitive Status: Appears within functional limits for tasks assessed/performed Arousal/Alertness: Awake/alert Orientation Level: Appears intact for tasks assessed Behavior During Session: [facility] for tasks performed    Balance     End of Session PT - End of Session Activity Tolerance: Patient tolerated treatment well Patient left: in chair;with call bell/phone within reach   GP Mobility: Walking and Moving Around Current Status (H0865): 0 percent impaired, limited or restricted Mobility: Walking and Moving Around Discharge Status [phone]): 0 percent impaired, limited or restricted   Lanard Arguijo,KATHrine E 03/23/2012, 10:49 AM Pager: [phone]

## 2012-03-23 NOTE — Progress Notes (Signed)
Occupational Therapy Treatment and Discharge Patient Details [name] MRN: [id number] DOB: 02-28-1936 Today's Date: 03/23/2012 Time: 8250-5397 OT Time Calculation (min): 14 min  OT Assessment / Plan / Recommendation Comments on Treatment Session Pt has met all goals as stated on eval and does not need f/u OT.    Follow Up Recommendations  No OT follow up    Barriers to Discharge       Equipment Recommendations  None recommended by OT    Recommendations for Other Services    Frequency     Plan All goals met and education completed, patient discharged from OT services    Precautions / Restrictions Precautions Precautions: Back Restrictions Weight Bearing Restrictions: No   Pertinent Vitals/Pain     ADL  Grooming: Performed;Teeth care;Modified independent Where Assessed - Grooming: Unsupported standing Upper Body Bathing: Performed;Modified independent Where Assessed - Upper Body Bathing: Unsupported sitting Lower Body Bathing: Performed;Modified independent Where Assessed - Lower Body Bathing: Unsupported sit to stand Upper Body Dressing: Performed;Modified independent Where Assessed - Upper Body Dressing: Unsupported sitting Lower Body Dressing: Performed;Modified independent Where Assessed - Lower Body Dressing: Unsupported sit to stand Toilet Transfer: Performed;Modified independent Toilet Transfer Method: Sit to Barista: Regular height toilet Toileting - Clothing Manipulation and Hygiene: Performed;Modified independent Where Assessed - Toileting Clothing Manipulation and Hygiene: Sit to stand from 3-in-1 or toilet ADL Comments: Pt was able to bathe LB and don pants , shoes by crossing one ankle over the opposite knee. Pt was also able to gather all ADL items from closet.    OT Diagnosis:    OT Problem List:   OT Treatment Interventions:     OT Goals ADL Goals ADL Goal: Lower Body Bathing - Progress: Met ADL Goal: Lower Body  Dressing - Progress: Met ADL Goal: Toilet Transfer - Progress: Met  Visit Information  Last OT Received On: 03/23/12 Assistance Needed: +1    Subjective Data  Subjective: I think I'm getting along pretty well.   Prior Functioning       Cognition  Overall Cognitive Status: Appears within functional limits for tasks assessed/performed Arousal/Alertness: Awake/alert Orientation Level: Appears intact for tasks assessed Behavior During Session: [facility] for tasks performed    Mobility Transfers Sit to Stand: 6: Modified independent (Device/Increase time);With upper extremity assist;From chair/3-in-1;From toilet Stand to Sit: 6: Modified independent (Device/Increase time);To chair/3-in-1;To toilet   Exercises    Balance    End of Session OT - End of Session Activity Tolerance: Patient tolerated treatment well Patient left: in chair;with call bell/phone within reach  GO Self Care Goal Status (Q7341): 0 percent impaired, limited or restricted   Linda Watson A 03/23/2012, 9:48 AM

## 2012-03-23 NOTE — Care Management Note (Signed)
    Page 1 of 2   03/23/2012     12:03:23 PM   CARE MANAGEMENT NOTE 03/23/2012  Patient:  Linda Watson, Linda Watson   Account Number:  [id number]  Date Initiated:  03/22/2012  Documentation initiated by:  Colleen Can  Subjective/Objective Assessment:   dx stenosis l4-S1; lumbar decompression     Action/Plan:   CM spoke with patient'Plans are for patient to return to her home in [facility] where she will have support from her son and friend. Already has RW and standard walker. States she is walking in hallway w/out walker and doen't need hh   Anticipated DC Date:  03/23/2012   Anticipated DC Plan:  HOME/SELF CARE  In-house referral  NA      DC Planning Services  CM consult      PAC Choice  NA   Choice offered to / List presented to:  NA   DME arranged  NA      DME agency  NA     HH arranged  HH-2 PT      HH agency  [facility]   Status of service:  Completed, signed off Medicare Important Message given?   (If response is "NO", the following Medicare IM given date fields will be blank) Date Medicare IM given:   Date Additional Medicare IM given:    Discharge Disposition:  HOME W HOME HEALTH SERVICES  Per UR Regulation:  Reviewed for med. necessity/level of care/duration of stay  If discussed at Long Length of Stay Meetings, dates discussed:    Comments:  03/23/2012 Raynelle Bring BSN CCM [phone] RECEIVED ORDERS FOR HHPT AND dme [facility] WILL PROVIDE HHPT WITH START DATE OF TOMORROW 0727/2013 dme TO BE ARRANGED BY Elisabeth Most

## 2014-12-08 ENCOUNTER — Ambulatory Visit
Admission: RE | Admit: 2014-12-08 | Discharge: 2014-12-08 | Disposition: A | Discharge status: Home / Self Care | Payer: PPO | Source: Ambulatory Visit | Attending: Family Medicine | Admitting: Family Medicine

## 2014-12-08 ENCOUNTER — Other Ambulatory Visit: Payer: Self-pay | Admitting: Family Medicine

## 2014-12-08 DIAGNOSIS — M25531 Pain in right wrist: Secondary | ICD-10-CM

## 2015-09-21 DIAGNOSIS — H26493 Other secondary cataract, bilateral: Secondary | ICD-10-CM | POA: Diagnosis not present

## 2015-09-21 DIAGNOSIS — R109 Unspecified abdominal pain: Secondary | ICD-10-CM | POA: Diagnosis not present

## 2015-09-21 DIAGNOSIS — H40013 Open angle with borderline findings, low risk, bilateral: Secondary | ICD-10-CM | POA: Diagnosis not present

## 2015-09-21 DIAGNOSIS — R140 Abdominal distension (gaseous): Secondary | ICD-10-CM | POA: Diagnosis not present

## 2015-10-27 DIAGNOSIS — Z961 Presence of intraocular lens: Secondary | ICD-10-CM | POA: Diagnosis not present

## 2015-10-27 DIAGNOSIS — H26492 Other secondary cataract, left eye: Secondary | ICD-10-CM | POA: Diagnosis not present

## 2015-12-28 DIAGNOSIS — Z0000 Encounter for general adult medical examination without abnormal findings: Secondary | ICD-10-CM | POA: Diagnosis not present

## 2015-12-28 DIAGNOSIS — Z79899 Other long term (current) drug therapy: Secondary | ICD-10-CM | POA: Diagnosis not present

## 2016-01-18 DIAGNOSIS — H6123 Impacted cerumen, bilateral: Secondary | ICD-10-CM | POA: Diagnosis not present

## 2016-05-03 DIAGNOSIS — L439 Lichen planus, unspecified: Secondary | ICD-10-CM | POA: Diagnosis not present

## 2016-05-03 DIAGNOSIS — K121 Other forms of stomatitis: Secondary | ICD-10-CM | POA: Diagnosis not present

## 2016-05-03 DIAGNOSIS — R5383 Other fatigue: Secondary | ICD-10-CM | POA: Diagnosis not present

## 2016-05-31 DIAGNOSIS — Z23 Encounter for immunization: Secondary | ICD-10-CM | POA: Diagnosis not present

## 2016-05-31 DIAGNOSIS — M1712 Unilateral primary osteoarthritis, left knee: Secondary | ICD-10-CM | POA: Diagnosis not present

## 2016-05-31 DIAGNOSIS — M1711 Unilateral primary osteoarthritis, right knee: Secondary | ICD-10-CM | POA: Diagnosis not present

## 2016-05-31 DIAGNOSIS — M25561 Pain in right knee: Secondary | ICD-10-CM | POA: Diagnosis not present

## 2016-05-31 DIAGNOSIS — M25562 Pain in left knee: Secondary | ICD-10-CM | POA: Diagnosis not present

## 2016-05-31 DIAGNOSIS — M170 Bilateral primary osteoarthritis of knee: Secondary | ICD-10-CM | POA: Diagnosis not present

## 2016-08-01 DIAGNOSIS — G8929 Other chronic pain: Secondary | ICD-10-CM | POA: Diagnosis not present

## 2016-08-01 DIAGNOSIS — M25512 Pain in left shoulder: Secondary | ICD-10-CM | POA: Diagnosis not present

## 2016-08-01 DIAGNOSIS — M7542 Impingement syndrome of left shoulder: Secondary | ICD-10-CM | POA: Diagnosis not present

## 2016-10-13 DIAGNOSIS — M1711 Unilateral primary osteoarthritis, right knee: Secondary | ICD-10-CM | POA: Diagnosis not present

## 2016-10-13 DIAGNOSIS — M7542 Impingement syndrome of left shoulder: Secondary | ICD-10-CM | POA: Diagnosis not present

## 2016-10-13 DIAGNOSIS — M170 Bilateral primary osteoarthritis of knee: Secondary | ICD-10-CM | POA: Diagnosis not present

## 2016-10-13 DIAGNOSIS — M1712 Unilateral primary osteoarthritis, left knee: Secondary | ICD-10-CM | POA: Diagnosis not present

## 2016-11-03 DIAGNOSIS — L438 Other lichen planus: Secondary | ICD-10-CM | POA: Diagnosis not present

## 2016-12-27 DIAGNOSIS — M1711 Unilateral primary osteoarthritis, right knee: Secondary | ICD-10-CM | POA: Diagnosis not present

## 2016-12-27 DIAGNOSIS — M7542 Impingement syndrome of left shoulder: Secondary | ICD-10-CM | POA: Diagnosis not present

## 2016-12-27 DIAGNOSIS — M1712 Unilateral primary osteoarthritis, left knee: Secondary | ICD-10-CM | POA: Diagnosis not present

## 2017-01-04 DIAGNOSIS — Z23 Encounter for immunization: Secondary | ICD-10-CM | POA: Diagnosis not present

## 2017-01-04 DIAGNOSIS — Z0000 Encounter for general adult medical examination without abnormal findings: Secondary | ICD-10-CM | POA: Diagnosis not present

## 2017-01-04 DIAGNOSIS — M158 Other polyosteoarthritis: Secondary | ICD-10-CM | POA: Diagnosis not present

## 2017-01-04 DIAGNOSIS — Z1211 Encounter for screening for malignant neoplasm of colon: Secondary | ICD-10-CM | POA: Diagnosis not present

## 2017-01-18 DIAGNOSIS — M1711 Unilateral primary osteoarthritis, right knee: Secondary | ICD-10-CM | POA: Diagnosis not present

## 2017-01-18 DIAGNOSIS — M1712 Unilateral primary osteoarthritis, left knee: Secondary | ICD-10-CM | POA: Diagnosis not present

## 2017-02-02 DIAGNOSIS — Z01818 Encounter for other preprocedural examination: Secondary | ICD-10-CM | POA: Diagnosis not present

## 2017-02-02 DIAGNOSIS — R001 Bradycardia, unspecified: Secondary | ICD-10-CM | POA: Diagnosis not present

## 2017-02-08 ENCOUNTER — Ambulatory Visit: Payer: Self-pay | Admitting: Specialist

## 2017-03-15 ENCOUNTER — Other Ambulatory Visit (HOSPITAL_COMMUNITY): Payer: Self-pay | Admitting: Emergency Medicine

## 2017-03-15 NOTE — Patient Instructions (Addendum)
Linda Watson  03/15/2017   Your procedure is scheduled on: 03-23-17  Report to [facility] Main  Entrance Take [place]  elevators to 3rd floor to  [place] at 530AM.    Call this number if you have problems the morning of surgery [phone]    Remember: ONLY 1 PERSON MAY GO WITH YOU TO SHORT STAY TO GET  READY MORNING OF YOUR SURGERY.  Do not eat food or drink liquids :After Midnight.     Take these medicines the morning of surgery with A SIP OF WATER: tylenol as needed, pantoprazole(protonix), eye drops               You may not have any metal on your body including hair pins and              piercings  Do not wear jewelry, make-up, lotions, powders or perfumes, deodorant             Do not wear nail polish.  Do not shave  48 hours prior to surgery.           Do not bring valuables to the hospital. [place].  Contacts, dentures or bridgework may not be worn into surgery.  Leave suitcase in the car. After surgery it may be brought to your room.                Please read over the following fact sheets you were given: _____________________________________________________________________           [facility] - Preparing for Surgery Before surgery, you can play an important role.  Because skin is not sterile, your skin needs to be as free of germs as possible.  You can reduce the number of germs on your skin by washing with CHG (chlorahexidine gluconate) soap before surgery.  CHG is an antiseptic cleaner which kills germs and bonds with the skin to continue killing germs even after washing. Please DO NOT use if you have an allergy to CHG or antibacterial soaps.  If your skin becomes reddened/irritated stop using the CHG and inform your nurse when you arrive at Short Stay. Do not shave (including legs and underarms) for at least 48 hours prior to the first CHG shower.  You may shave your  face/neck. Please follow these instructions carefully:  1.  Shower with CHG Soap the night before surgery and the  morning of Surgery.  2.  If you choose to wash your hair, wash your hair first as usual with your  normal  shampoo.  3.  After you shampoo, rinse your hair and body thoroughly to remove the  shampoo.                           4.  Use CHG as you would any other liquid soap.  You can apply chg directly  to the skin and wash                       Gently with a scrungie or clean washcloth.  5.  Apply the CHG Soap to your body ONLY FROM THE NECK DOWN.   Do not use on face/ open  Wound or open sores. Avoid contact with eyes, ears mouth and genitals (private parts).                       Wash face,  Genitals (private parts) with your normal soap.             6.  Wash thoroughly, paying special attention to the area where your surgery  will be performed.  7.  Thoroughly rinse your body with warm water from the neck down.  8.  DO NOT shower/wash with your normal soap after using and rinsing off  the CHG Soap.                9.  Pat yourself dry with a clean towel.            10.  Wear clean pajamas.            11.  Place clean sheets on your bed the night of your first shower and do not  sleep with pets. Day of Surgery : Do not apply any lotions/deodorants the morning of surgery.  Please wear clean clothes to the hospital/surgery center.  FAILURE TO FOLLOW THESE INSTRUCTIONS MAY RESULT IN THE CANCELLATION OF YOUR SURGERY PATIENT SIGNATURE_________________________________  NURSE SIGNATURE__________________________________  ________________________________________________________________________   Linda Watson  An incentive spirometer is a tool that can help keep your lungs clear and active. This tool measures how well you are filling your lungs with each breath. Taking long deep breaths may help reverse or decrease the chance of developing breathing  (pulmonary) problems (especially infection) following:  A long period of time when you are unable to move or be active. BEFORE THE PROCEDURE   If the spirometer includes an indicator to show your best effort, your nurse or respiratory therapist will set it to a desired goal.  If possible, sit up straight or lean slightly forward. Try not to slouch.  Hold the incentive spirometer in an upright position. INSTRUCTIONS FOR USE  1. Sit on the edge of your bed if possible, or sit up as far as you can in bed or on a chair. 2. Hold the incentive spirometer in an upright position. 3. Breathe out normally. 4. Place the mouthpiece in your mouth and seal your lips tightly around it. 5. Breathe in slowly and as deeply as possible, raising the piston or the ball toward the top of the column. 6. Hold your breath for 3-5 seconds or for as long as possible. Allow the piston or ball to fall to the bottom of the column. 7. Remove the mouthpiece from your mouth and breathe out normally. 8. Rest for a few seconds and repeat Steps 1 through 7 at least 10 times every 1-2 hours when you are awake. Take your time and take a few normal breaths between deep breaths. 9. The spirometer may include an indicator to show your best effort. Use the indicator as a goal to work toward during each repetition. 10. After each set of 10 deep breaths, practice coughing to be sure your lungs are clear. If you have an incision (the cut made at the time of surgery), support your incision when coughing by placing a pillow or rolled up towels firmly against it. Once you are able to get out of bed, walk around indoors and cough well. You may stop using the incentive spirometer when instructed by your caregiver.  RISKS AND COMPLICATIONS  Take your time so you do not get  dizzy or light-headed.  If you are in pain, you may need to take or ask for pain medication before doing incentive spirometry. It is harder to take a deep breath if you  are having pain. AFTER USE  Rest and breathe slowly and easily.  It can be helpful to keep track of a log of your progress. Your caregiver can provide you with a simple table to help with this. If you are using the spirometer at home, follow these instructions: [place] IF:   You are having difficultly using the spirometer.  You have trouble using the spirometer as often as instructed.  Your pain medication is not giving enough relief while using the spirometer.  You develop fever of 100.5 F (38.1 C) or higher. SEEK IMMEDIATE MEDICAL CARE IF:   You cough up bloody sputum that had not been present before.  You develop fever of 102 F (38.9 C) or greater.  You develop worsening pain at or near the incision site. MAKE SURE YOU:   Understand these instructions.  Will watch your condition.  Will get help right away if you are not doing well or get worse. Document Released: 12/26/2006 Document Revised: 11/07/2011 Document Reviewed: 02/26/2007 [facility] Patient Information 2014 [place], [place].   ________________________________________________________________________

## 2017-03-16 ENCOUNTER — Ambulatory Visit: Payer: Self-pay | Admitting: Orthopedic Surgery

## 2017-03-16 NOTE — H&P (Signed)
Linda Watson DOB: 09-11-1935 Widowed / Language: [name] / Race: White Female  H&P Date: 03/16/17  Chief Complaint: Right knee pain  History of Present Illness The patient is a 81 year old female who comes in today for a preoperative History and Physical. The patient is scheduled for a right total knee arthroplasty to be performed by Dr. Johnn Hai, MD at [facility] on March 23, 2017 . Linda Watson reports progressively worsening R knee pain refractory to conservative tx including cortisone, viscosupplementation, ice, elevation, medications, relative rest, activity modification, quad strengthening. Her pain is limiting ADLs and interfering with quality of life at this point. She desires to proceed with surgery.  Dr. Tonita Cong and the patient mutually agreed to proceed with a total knee replacement. Risks and benefits of the procedure were discussed including stiffness, suboptimal range of motion, persistent pain, infection requiring removal of prosthesis and reinsertion, need for prophylactic antibiotics in the future, for example, dental procedures, possible need for manipulation, revision in the future and also anesthetic complications including DVT, PE, etc. We discussed the perioperative course, time in the hospital, postoperative recovery and the need for elevation to control swelling. We also discussed the predicted range of motion and the probability that squatting and kneeling would be unobtainable in the future. In addition, postoperative anticoagulation was discussed. We have obtained preoperative medical clearance as necessary. Provided illustrated handout and discussed it in detail. They will enroll in the total joint replacement educational forum at the hospital  Problem List/Past Medical Hx Chronic left shoulder pain (M25.512)  Chronic pain of right knee (M25.561)  Shoulder impingement, left (M75.42)  Primary osteoarthritis of left knee (M17.12)  Lumbar strain (S39.012A)  [06/29/1992]: Gastroesophageal Reflux Disease   Allergies OxyCODONE HCl *ANALGESICS - OPIOID*   Family History First Degree Relatives  Cerebrovascular Accident  sister Osteoarthritis  sister Heart Disease  mother and father Cancer  sister Kidney disease  sister Hypertension  sister  Social History Tobacco use  never smoker Exercise  Exercises rarely; does other Drug/Alcohol Rehab (Currently)  no Alcohol use  never consumed alcohol Current work status  retired Living situation  live alone, apartment, 9 steps to enter Illicit drug use  no Drug/Alcohol Rehab (Previously)  no Marital status  widowed Pain Contract  no Number of flights of stairs before winded  less than 1 Children  2 Advance Directives  none Post-Surgical Plans  short term rehab stay vs. home with HHPT (lives alone)  Medication History  Meloxicam (7.5MG  Tablet, Oral) Active. Omeprazole (40MG  Capsule DR, Oral) Active. Pantoprazole Sodium (20MG  Tablet DR, Oral) Active. Vitamin B12 TR (1000MCG Tablet ER, Oral) Active. Biotin (10000MCG Tablet, Oral) Active. Glucosamine HCl (1000MG  Tablet, Oral) Active. Magnesium (250MG  Tablet, Oral) Active. Polyethylene Glycol 3350 (Oral) Active. ValACYclovir HCl (500MG  Tablet, Oral) Active.  Past Surgical History Arthroscopy of Knee  bilateral Hysterectomy  complete (cancerous) Tonsillectomy  Appendectomy  Arthroscopy of Shoulder  left Cataract Surgery  bilateral Gallbladder Surgery  open  Review of Systems General Not Present- Chills, Fatigue, Fever, Memory Loss, Night Sweats, Weight Gain and Weight Loss. Skin Not Present- Eczema, Hives, Itching, Lesions and Rash. HEENT Present- Headache and Tinnitus. Not Present- Dentures, Double Vision, Hearing Loss and Visual Loss. Respiratory Not Present- Allergies, Chronic Cough, Coughing up blood, Shortness of breath at rest and Shortness of breath with exertion. Cardiovascular Not  Present- Chest Pain, Difficulty Breathing Lying Down, Murmur, Palpitations, Racing/skipping heartbeats and Swelling. Gastrointestinal Present- Constipation. Not Present- Abdominal Pain, Bloody Stool, Diarrhea, Difficulty  Swallowing, Heartburn, Jaundice, Loss of appetitie, Nausea and Vomiting. Female Genitourinary Not Present- Blood in Urine, Discharge, Flank Pain, Incontinence, Painful Urination, Urgency, Urinary frequency, Urinary Retention, Urinating at Night and Weak urinary stream. Musculoskeletal Present- Back Pain and Morning Stiffness. Not Present- Joint Pain, Joint Swelling, Muscle Pain, Muscle Weakness and Spasms. Neurological Not Present- Blackout spells, Difficulty with balance, Dizziness, Paralysis, Tremor and Weakness. Psychiatric Not Present- Insomnia.  Physical Exam General Mental Status -Alert, cooperative and good historian. General Appearance-pleasant, Not in acute distress. Orientation-Oriented X3. Build & Nutrition-Well nourished and Well developed.  Head and Neck Head-normocephalic, atraumatic . Neck Global Assessment - supple, no bruit auscultated on the right, no bruit auscultated on the left.  Eye Pupil - Bilateral-Regular and Round. Motion - Bilateral-EOMI.  Chest and Lung Exam Auscultation Breath sounds - clear at anterior chest wall and clear at posterior chest wall. Adventitious sounds - No Adventitious sounds.  Cardiovascular Auscultation Rhythm - Regular rate and rhythm. Heart Sounds - S1 WNL and S2 WNL. Murmurs & Other Heart Sounds - Auscultation of the heart reveals - No Murmurs.  Abdomen Palpation/Percussion Tenderness - Abdomen is non-tender to palpation. Rigidity (guarding) - Abdomen is soft. Auscultation Auscultation of the abdomen reveals - Bowel sounds normal.  Female Genitourinary Not done, not pertinent to present illness  Musculoskeletal On exam, moderate distress. Walks with an antalgic gait. Mood and affect is  appropriate. Knee is exquisitely tender in medial joint line, varus deformity, patellofemoral pain with compression. Range is -5 to 110. Ipsilateral hip and ankle exam is unremarkable.  Imaging AP and lateral of the knee demonstrates bone-on-bone arthrosis, medial compartment, patellofemoral joint.  Assessment & Plan Primary osteoarthritis of right knee (M17.11)  Pt with end-stage right knee DJD, bone-on-bone, refractory to conservative tx, scheduled for total knee replacement by Dr. Tonita Cong. We again discussed the procedure itself as well as risks, complications and alternatives, including but not limited to DVT, PE, infx, bleeding, failure of procedure, need for secondary procedure including manipulation, nerve injury, ongoing pain/symptoms, anesthesia risk, even stroke or death. Also discussed typical post-op protocols, activity restrictions, need for PT, flexion/extension exercises, time out of work. Discussed need for DVT ppx post-op per protocol. Discussed dental ppx and infx prevention. Also discussed limitations post-operatively such as kneeling and squatting. All questions were answered. Patient desires to proceed with surgery as scheduled. Will hold supplements, ASA and NSAIDs accordingly. Will remain NPO after MN night before surgery. Will present to [facility] for pre-op testing. Anticipate hospital stay to include at least 2 midnights given medical history and to ensure proper pain control. Plan ASA 325mg  BID for DVT ppx post-op. Plan tramadol, Colace, Miralax. Plan possible short rehav stay vs home with HHPT (she lives alone). Will follow up 10-14 days post-op for suture removal and xrays.  Plan right total knee replacement  Signed electronically by Cecilie Kicks, PA-C for Dr. Tonita Cong

## 2017-03-17 ENCOUNTER — Encounter (HOSPITAL_COMMUNITY): Payer: Self-pay

## 2017-03-17 ENCOUNTER — Encounter (HOSPITAL_COMMUNITY)
Admission: RE | Admit: 2017-03-17 | Discharge: 2017-03-17 | Disposition: A | Discharge status: Home / Self Care | Payer: PPO | Source: Ambulatory Visit | Attending: Specialist | Admitting: Specialist

## 2017-03-17 ENCOUNTER — Encounter (INDEPENDENT_AMBULATORY_CARE_PROVIDER_SITE_OTHER): Payer: Self-pay

## 2017-03-17 DIAGNOSIS — Z01812 Encounter for preprocedural laboratory examination: Secondary | ICD-10-CM | POA: Insufficient documentation

## 2017-03-17 DIAGNOSIS — L03114 Cellulitis of left upper limb: Secondary | ICD-10-CM | POA: Diagnosis not present

## 2017-03-17 DIAGNOSIS — T63441A Toxic effect of venom of bees, accidental (unintentional), initial encounter: Secondary | ICD-10-CM | POA: Diagnosis not present

## 2017-03-17 HISTORY — DX: Toxic effect of venom of bees, accidental (unintentional), initial encounter: T63.441A

## 2017-03-17 HISTORY — DX: Unspecified hemorrhoids: K64.9

## 2017-03-17 HISTORY — DX: Personal history of other infectious and parasitic diseases: Z86.19

## 2017-03-17 LAB — SURGICAL PCR SCREEN
MRSA, PCR: NEGATIVE
STAPHYLOCOCCUS AUREUS: NEGATIVE

## 2017-03-17 LAB — URINALYSIS, ROUTINE W REFLEX MICROSCOPIC
Bilirubin Urine: NEGATIVE
Glucose, UA: NEGATIVE mg/dL
Hgb urine dipstick: NEGATIVE
Ketones, ur: NEGATIVE mg/dL
Leukocytes, UA: NEGATIVE
NITRITE: NEGATIVE
Protein, ur: NEGATIVE mg/dL
SPECIFIC GRAVITY, URINE: 1.006 (ref 1.005–1.030)
pH: 7 (ref 5.0–8.0)

## 2017-03-17 LAB — BASIC METABOLIC PANEL
Anion gap: 11 (ref 5–15)
BUN: 20 mg/dL (ref 6–20)
CALCIUM: 9.1 mg/dL (ref 8.9–10.3)
CO2: 23 mmol/L (ref 22–32)
CREATININE: 0.79 mg/dL (ref 0.44–1.00)
Chloride: 103 mmol/L (ref 101–111)
GFR calc Af Amer: >60 mL/min (ref >60)
GFR calc non Af Amer: >60 mL/min (ref >60)
Glucose, Bld: 85 mg/dL (ref 65–99)
Potassium: 4.5 mmol/L (ref 3.5–5.1)
SODIUM: 137 mmol/L (ref 135–145)

## 2017-03-17 LAB — PROTIME-INR
INR: 0.99
Prothrombin Time: 13.1 seconds (ref 11.4–15.2)

## 2017-03-17 LAB — CBC
HCT: 38.9 % (ref 36.0–46.0)
Hemoglobin: 13 g/dL (ref 12.0–15.0)
MCH: 29.2 pg (ref 26.0–34.0)
MCHC: 33.4 g/dL (ref 30.0–36.0)
MCV: 87.4 fL (ref 78.0–100.0)
PLATELETS: 251 10*3/uL (ref 150–400)
RBC: 4.45 MIL/uL (ref 3.87–5.11)
RDW: 14.2 % (ref 11.5–15.5)
WBC: 6.9 10*3/uL (ref 4.0–10.5)

## 2017-03-17 LAB — APTT: aPTT: 32 seconds (ref 24–36)

## 2017-03-17 NOTE — Progress Notes (Addendum)
RN received call from patient who informed that she had made it to her PCP office and was told that her "hand is infected " (see prior note about source of infection). Patient also informed RN that the physcian recommended that she "postpone surgery for a week" and staatred her on antibiocis and gave her "a shot" in the office . Paitnet states she already alled over to [facility] and [facility] about the physicians remc65mmendation to postpone surgery. Would like RN to call her back if anything is heard back from them so she knows that surgeon is aware.   Note in epic of office visit today ; PA Couillard writes:   "Gave Dexamethasone and Depo-Medrol in office.  Please take Doxycycline as prescribed with food.  Please use ice (20 minutes on) several times per day.  Please call surgeon and postpone surgery.  If you are not feeling better in 10-14 days, then please call the office."   Addendum 15:16 RN called and spoke to [place] at [employer]; she states they are aware and will make Dr Tonita Cong aware and call patient with any recommendations.  RN called patient and informed her to keep on look out for call from surgeon office as they did receive her message and will be contacting her with updates . Patient happy with this and thanked Therapist, sports for service today .

## 2017-03-17 NOTE — Progress Notes (Signed)
EKG 02-02-17 on chart   Also, pt presents at pre-op c/o 2 bee stings to anterior left hand. Pt states she was watering her fern yesterday and was stung and while trying to address initial sting, was stung again . Pt hand with obvious edema and redness, and endorses warmness to the touch. Denies hx or current severe allergy to venom (ie no tongue swelling or throat closing). She states this summer alons she has been stung multiple times already and usually it just hurts until it heals. Although hand is visibly irritated, RN suggested she seen he gen prac. For eval to r/o potential infection as patient does have hx of being on methotrexate  Although currently on hold. Pt verbalized agreement and when RN called her PCP to obtain copy of recent EKG, RN was able to secure appt for patient to be evaluated by a PA in office . Pt agreeable to this and expressed thanks. PCP office happy to help patient.

## 2017-03-21 ENCOUNTER — Ambulatory Visit: Payer: Self-pay | Admitting: Orthopedic Surgery

## 2017-03-23 ENCOUNTER — Inpatient Hospital Stay (HOSPITAL_COMMUNITY): Admission: RE | Admit: 2017-03-23 | Payer: PPO | Source: Ambulatory Visit | Admitting: Specialist

## 2017-03-23 ENCOUNTER — Encounter (HOSPITAL_COMMUNITY): Admission: RE | Payer: Self-pay | Source: Ambulatory Visit

## 2017-03-23 LAB — TYPE AND SCREEN
ABO/RH(D): O POS
Antibody Screen: NEGATIVE

## 2017-03-23 SURGERY — ARTHROPLASTY, KNEE, TOTAL
Anesthesia: General | Site: Knee | Laterality: Right

## 2017-04-07 DIAGNOSIS — H6122 Impacted cerumen, left ear: Secondary | ICD-10-CM | POA: Diagnosis not present

## 2017-04-07 DIAGNOSIS — H60392 Other infective otitis externa, left ear: Secondary | ICD-10-CM | POA: Diagnosis not present

## 2017-04-07 DIAGNOSIS — H6983 Other specified disorders of Eustachian tube, bilateral: Secondary | ICD-10-CM | POA: Diagnosis not present

## 2017-04-11 ENCOUNTER — Ambulatory Visit: Payer: Self-pay | Admitting: Orthopedic Surgery

## 2017-04-11 NOTE — H&P (Signed)
Linda Watson DOB: July 26, 1936 Widowed / Language: [name] / Race: White Female  Chief Complaint: Right knee pain  History of Present Illness The patient is a 81 year old female who comes in today for a preoperative History and Physical. The patient is scheduled for a right total knee arthroplasty to be performed by Dr. Johnn Hai, MD at [facility] on April 13, 2017, original surgery date postponed due to cellulitis of the hand following a bee sting. Emree reports progressively worsening R knee pain refractory to conservative tx including cortisone, viscosupplementation, ice, elevation, medications, relative rest, activity modification, quad strengthening. Her pain is limiting ADLs and interfering with quality of life at this point. She desires to proceed with surgery.  Dr. Tonita Cong and the patient mutually agreed to proceed with a total knee replacement. Risks and benefits of the procedure were discussed including stiffness, suboptimal range of motion, persistent pain, infection requiring removal of prosthesis and reinsertion, need for prophylactic antibiotics in the future, for example, dental procedures, possible need for manipulation, revision in the future and also anesthetic complications including DVT, PE, etc. We discussed the perioperative course, time in the hospital, postoperative recovery and the need for elevation to control swelling. We also discussed the predicted range of motion and the probability that squatting and kneeling would be unobtainable in the future. In addition, postoperative anticoagulation was discussed. We have obtained preoperative medical clearance as necessary. Provided illustrated handout and discussed it in detail. They will enroll in the total joint replacement educational forum at the hospital  Problem List/Past Medical Hx Chronic left shoulder pain (M25.512)  Chronic pain of right knee (M25.561)  Shoulder impingement, left (M75.42)  Primary  osteoarthritis of left knee (M17.12)  Lumbar strain (S39.012A) [06/29/1992]: Gastroesophageal Reflux Disease   Allergies OxyCODONE HCl *ANALGESICS - OPIOID*   Family History First Degree Relatives  Cerebrovascular Accident  sister Osteoarthritis  sister Heart Disease  mother and father Cancer  sister Kidney disease  sister Hypertension  sister  Social History Tobacco use  never smoker Exercise  Exercises rarely; does other Drug/Alcohol Rehab (Currently)  no Alcohol use  never consumed alcohol Current work status  retired Living situation  live alone, apartment, 9 steps to enter Illicit drug use  no Drug/Alcohol Rehab (Previously)  no Marital status  widowed Pain Contract  no Number of flights of stairs before winded  less than 1 Children  2 Advance Directives  none Post-Surgical Plans  short term rehab stay vs. home with HHPT (lives alone)  Medication History  Meloxicam (7.5MG  Tablet, Oral) Active. Omeprazole (40MG  Capsule DR, Oral) Active. Pantoprazole Sodium (20MG  Tablet DR, Oral) Active. Vitamin B12 TR (1000MCG Tablet ER, Oral) Active. Biotin (10000MCG Tablet, Oral) Active. Glucosamine HCl (1000MG  Tablet, Oral) Active. Magnesium (250MG  Tablet, Oral) Active. Polyethylene Glycol 3350 (Oral) Active. ValACYclovir HCl (500MG  Tablet, Oral) Active.  Past Surgical History Arthroscopy of Knee  bilateral Hysterectomy  complete (cancerous) Tonsillectomy  Appendectomy  Arthroscopy of Shoulder  left Cataract Surgery  bilateral Gallbladder Surgery  open  Review of Systems General Not Present- Chills, Fatigue, Fever, Memory Loss, Night Sweats, Weight Gain and Weight Loss. Skin Not Present- Eczema, Hives, Itching, Lesions and Rash. HEENT Present- Headache and Tinnitus. Not Present- Dentures, Double Vision, Hearing Loss and Visual Loss. Respiratory Not Present- Allergies, Chronic Cough, Coughing up blood, Shortness of breath at  rest and Shortness of breath with exertion. Cardiovascular Not Present- Chest Pain, Difficulty Breathing Lying Down, Murmur, Palpitations, Racing/skipping heartbeats and Swelling. Gastrointestinal Present-  Constipation. Not Present- Abdominal Pain, Bloody Stool, Diarrhea, Difficulty Swallowing, Heartburn, Jaundice, Loss of appetitie, Nausea and Vomiting. Female Genitourinary Not Present- Blood in Urine, Discharge, Flank Pain, Incontinence, Painful Urination, Urgency, Urinary frequency, Urinary Retention, Urinating at Night and Weak urinary stream. Musculoskeletal Present- Back Pain and Morning Stiffness. Not Present- Joint Pain, Joint Swelling, Muscle Pain, Muscle Weakness and Spasms. Neurological Not Present- Blackout spells, Difficulty with balance, Dizziness, Paralysis, Tremor and Weakness. Psychiatric Not Present- Insomnia.  Physical Exam General Mental Status -Alert, cooperative and good historian. General Appearance-pleasant, Not in acute distress. Orientation-Oriented X3. Build & Nutrition-Well nourished and Well developed.  Head and Neck Head-normocephalic, atraumatic . Neck Global Assessment - supple, no bruit auscultated on the right, no bruit auscultated on the left.  Eye Pupil - Bilateral-Regular and Round. Motion - Bilateral-EOMI.  Chest and Lung Exam Auscultation Breath sounds - clear at anterior chest wall and clear at posterior chest wall. Adventitious sounds - No Adventitious sounds.  Cardiovascular Auscultation Rhythm - Regular rate and rhythm. Heart Sounds - S1 WNL and S2 WNL. Murmurs & Other Heart Sounds - Auscultation of the heart reveals - No Murmurs.  Abdomen Palpation/Percussion Tenderness - Abdomen is non-tender to palpation. Rigidity (guarding) - Abdomen is soft. Auscultation Auscultation of the abdomen reveals - Bowel sounds normal.  Female Genitourinary Not done, not pertinent to present illness  Musculoskeletal On exam,  moderate distress. Walks with an antalgic gait. Mood and affect is appropriate. Knee is exquisitely tender in medial joint line, varus deformity, patellofemoral pain with compression. Range is -5 to 110. Ipsilateral hip and ankle exam is unremarkable.  Imaging AP and lateral of the knee demonstrates bone-on-bone arthrosis, medial compartment, patellofemoral joint.  Assessment & Plan Primary osteoarthritis of right knee (M17.11)  Pt with end-stage right knee DJD, bone-on-bone, refractory to conservative tx, scheduled for total knee replacement by Dr. Tonita Cong. We again discussed the procedure itself as well as risks, complications and alternatives, including but not limited to DVT, PE, infx, bleeding, failure of procedure, need for secondary procedure including manipulation, nerve injury, ongoing pain/symptoms, anesthesia risk, even stroke or death. Also discussed typical post-op protocols, activity restrictions, need for PT, flexion/extension exercises, time out of work. Discussed need for DVT ppx post-op per protocol. Discussed dental ppx and infx prevention. Also discussed limitations post-operatively such as kneeling and squatting. All questions were answered. Patient desires to proceed with surgery as scheduled. Will hold supplements, ASA and NSAIDs accordingly. Will remain NPO after MN night before surgery. Will present to [facility] for pre-op testing. Anticipate hospital stay to include at least 2 midnights given medical history and to ensure proper pain control. Plan ASA 325mg  BID for DVT ppx post-op. Plan tramadol, Colace, Miralax. Plan possible short rehav stay vs home with HHPT (she lives alone). Will follow up 10-14 days post-op for suture removal and xrays.  Plan right total knee replacement  Signed electronically by Cecilie Kicks, PA-C for Dr. Tonita Cong

## 2017-04-13 ENCOUNTER — Encounter (HOSPITAL_COMMUNITY)
Admission: RE | Disposition: A | Discharge status: Home / Self Care | Payer: Self-pay | Source: Ambulatory Visit | Attending: Specialist

## 2017-04-13 ENCOUNTER — Inpatient Hospital Stay (HOSPITAL_COMMUNITY)
Admission: RE | Admit: 2017-04-13 | Discharge: 2017-04-16 | DRG: 470 | Disposition: A | Discharge status: Home / Self Care | Payer: PPO | Source: Ambulatory Visit | Attending: Specialist | Admitting: Specialist

## 2017-04-13 ENCOUNTER — Inpatient Hospital Stay (HOSPITAL_COMMUNITY): Payer: PPO | Admitting: Certified Registered Nurse Anesthetist

## 2017-04-13 ENCOUNTER — Encounter (HOSPITAL_COMMUNITY): Payer: Self-pay | Admitting: *Deleted

## 2017-04-13 ENCOUNTER — Inpatient Hospital Stay (HOSPITAL_COMMUNITY): Payer: PPO

## 2017-04-13 DIAGNOSIS — M25812 Other specified joint disorders, left shoulder: Secondary | ICD-10-CM | POA: Diagnosis present

## 2017-04-13 DIAGNOSIS — M48061 Spinal stenosis, lumbar region without neurogenic claudication: Secondary | ICD-10-CM | POA: Diagnosis not present

## 2017-04-13 DIAGNOSIS — Z96651 Presence of right artificial knee joint: Secondary | ICD-10-CM | POA: Diagnosis not present

## 2017-04-13 DIAGNOSIS — M25512 Pain in left shoulder: Secondary | ICD-10-CM | POA: Diagnosis not present

## 2017-04-13 DIAGNOSIS — M1711 Unilateral primary osteoarthritis, right knee: Secondary | ICD-10-CM | POA: Diagnosis not present

## 2017-04-13 DIAGNOSIS — G8918 Other acute postprocedural pain: Secondary | ICD-10-CM | POA: Diagnosis not present

## 2017-04-13 DIAGNOSIS — R269 Unspecified abnormalities of gait and mobility: Secondary | ICD-10-CM | POA: Diagnosis not present

## 2017-04-13 DIAGNOSIS — M4800 Spinal stenosis, site unspecified: Secondary | ICD-10-CM | POA: Diagnosis not present

## 2017-04-13 DIAGNOSIS — Z96659 Presence of unspecified artificial knee joint: Secondary | ICD-10-CM

## 2017-04-13 DIAGNOSIS — G8929 Other chronic pain: Secondary | ICD-10-CM | POA: Diagnosis present

## 2017-04-13 DIAGNOSIS — M21061 Valgus deformity, not elsewhere classified, right knee: Secondary | ICD-10-CM | POA: Diagnosis present

## 2017-04-13 DIAGNOSIS — K219 Gastro-esophageal reflux disease without esophagitis: Secondary | ICD-10-CM | POA: Diagnosis present

## 2017-04-13 DIAGNOSIS — Z471 Aftercare following joint replacement surgery: Secondary | ICD-10-CM | POA: Diagnosis not present

## 2017-04-13 HISTORY — PX: TOTAL KNEE ARTHROPLASTY: SHX125

## 2017-04-13 SURGERY — ARTHROPLASTY, KNEE, TOTAL
Anesthesia: Monitor Anesthesia Care | Site: Knee | Laterality: Right

## 2017-04-13 MED ORDER — ONDANSETRON HCL 4 MG/2ML IJ SOLN
INTRAMUSCULAR | Status: AC
  Filled 2017-04-13: qty 2

## 2017-04-13 MED ORDER — PANTOPRAZOLE SODIUM 40 MG PO TBEC
40.0000 mg | DELAYED_RELEASE_TABLET | Freq: Every day | ORAL | Status: DC
  Administered 2017-04-14 – 2017-04-16 (×3): 40 mg via ORAL
  Filled 2017-04-13 (×3): qty 1

## 2017-04-13 MED ORDER — FENTANYL CITRATE (PF) 100 MCG/2ML IJ SOLN
25.0000 ug | INTRAMUSCULAR | Status: DC | PRN
  Administered 2017-04-13 (×2): 50 ug via INTRAVENOUS

## 2017-04-13 MED ORDER — BUPIVACAINE-EPINEPHRINE 0.25% -1:200000 IJ SOLN
INTRAMUSCULAR | Status: DC | PRN
  Administered 2017-04-13: 50 mL

## 2017-04-13 MED ORDER — FENTANYL CITRATE (PF) 100 MCG/2ML IJ SOLN
INTRAMUSCULAR | Status: AC
  Filled 2017-04-13: qty 2

## 2017-04-13 MED ORDER — ONDANSETRON HCL 4 MG PO TABS
4.0000 mg | ORAL_TABLET | Freq: Four times a day (QID) | ORAL | Status: DC | PRN
  Administered 2017-04-13: 4 mg via ORAL
  Filled 2017-04-13: qty 1

## 2017-04-13 MED ORDER — SODIUM CHLORIDE 0.9 % IR SOLN
Status: DC | PRN
  Administered 2017-04-13: 1000 mL

## 2017-04-13 MED ORDER — LORATADINE 10 MG PO TABS
10.0000 mg | ORAL_TABLET | Freq: Every day | ORAL | Status: DC
Start: 2017-04-13 — End: 2017-04-16
  Administered 2017-04-13 – 2017-04-16 (×4): 10 mg via ORAL
  Filled 2017-04-13 (×4): qty 1

## 2017-04-13 MED ORDER — METHOCARBAMOL 1000 MG/10ML IJ SOLN
500.0000 mg | Freq: Four times a day (QID) | INTRAVENOUS | Status: DC | PRN
  Administered 2017-04-13: 500 mg via INTRAVENOUS
  Filled 2017-04-13: qty 550

## 2017-04-13 MED ORDER — PROPOFOL 500 MG/50ML IV EMUL
INTRAVENOUS | Status: DC | PRN
  Administered 2017-04-13: 75 ug/kg/min via INTRAVENOUS

## 2017-04-13 MED ORDER — BUPIVACAINE-EPINEPHRINE (PF) 0.25% -1:200000 IJ SOLN
INTRAMUSCULAR | Status: AC
  Filled 2017-04-13: qty 60

## 2017-04-13 MED ORDER — MENTHOL 3 MG MT LOZG: 1.0000 | LOZENGE | OROMUCOSAL | Status: DC | PRN

## 2017-04-13 MED ORDER — OXYCODONE-ACETAMINOPHEN 5-325 MG PO TABS: 1.0000 | ORAL_TABLET | ORAL | 0 refills | Status: DC | PRN

## 2017-04-13 MED ORDER — RISAQUAD PO CAPS
1.0000 | ORAL_CAPSULE | Freq: Every day | ORAL | Status: DC
  Administered 2017-04-13 – 2017-04-16 (×4): 1 via ORAL
  Filled 2017-04-13 (×4): qty 1

## 2017-04-13 MED ORDER — ACETAMINOPHEN 10 MG/ML IV SOLN
1000.0000 mg | INTRAVENOUS | Status: AC
Start: 2017-04-13 — End: 2017-04-13
  Administered 2017-04-13: 1000 mg via INTRAVENOUS
  Filled 2017-04-13: qty 100

## 2017-04-13 MED ORDER — DOCUSATE SODIUM 100 MG PO CAPS
100.0000 mg | ORAL_CAPSULE | Freq: Two times a day (BID) | ORAL | Status: DC
  Administered 2017-04-13 – 2017-04-16 (×6): 100 mg via ORAL
  Filled 2017-04-13 (×6): qty 1

## 2017-04-13 MED ORDER — FENTANYL CITRATE (PF) 100 MCG/2ML IJ SOLN
50.0000 ug | INTRAMUSCULAR | Status: DC | PRN
  Administered 2017-04-13: 50 ug via INTRAVENOUS

## 2017-04-13 MED ORDER — DEXAMETHASONE SODIUM PHOSPHATE 10 MG/ML IJ SOLN
INTRAMUSCULAR | Status: DC | PRN
  Administered 2017-04-13: 10 mg via INTRAVENOUS

## 2017-04-13 MED ORDER — ASPIRIN EC 325 MG PO TBEC
325.0000 mg | DELAYED_RELEASE_TABLET | Freq: Two times a day (BID) | ORAL | Status: DC
  Administered 2017-04-13 – 2017-04-16 (×6): 325 mg via ORAL
  Filled 2017-04-13 (×6): qty 1

## 2017-04-13 MED ORDER — POLYVINYL ALCOHOL 1.4 % OP SOLN
1.0000 [drp] | Freq: Two times a day (BID) | OPHTHALMIC | Status: DC | PRN
  Administered 2017-04-13: 1 [drp] via OPHTHALMIC
  Filled 2017-04-13: qty 15

## 2017-04-13 MED ORDER — KCL IN DEXTROSE-NACL 20-5-0.45 MEQ/L-%-% IV SOLN
INTRAVENOUS | Status: AC
  Administered 2017-04-13: 17:00:00 via INTRAVENOUS
  Filled 2017-04-13 (×2): qty 1000

## 2017-04-13 MED ORDER — PROPOFOL 10 MG/ML IV BOLUS
INTRAVENOUS | Status: AC
  Filled 2017-04-13: qty 20

## 2017-04-13 MED ORDER — ONDANSETRON HCL 4 MG/2ML IJ SOLN
INTRAMUSCULAR | Status: DC | PRN
  Administered 2017-04-13: 4 mg via INTRAVENOUS

## 2017-04-13 MED ORDER — POLYETHYLENE GLYCOL 3350 17 G PO PACK: 17.0000 g | PACK | Freq: Every day | ORAL | 0 refills | Status: DC

## 2017-04-13 MED ORDER — OXYCODONE HCL 5 MG PO TABS
5.0000 mg | ORAL_TABLET | ORAL | Status: DC | PRN
  Administered 2017-04-13 – 2017-04-14 (×3): 5 mg via ORAL
  Administered 2017-04-14: 10 mg via ORAL
  Administered 2017-04-14 (×3): 5 mg via ORAL
  Administered 2017-04-15: 10 mg via ORAL
  Filled 2017-04-13: qty 2
  Filled 2017-04-13 (×5): qty 1
  Filled 2017-04-13: qty 2
  Filled 2017-04-13: qty 1

## 2017-04-13 MED ORDER — PROPOFOL 10 MG/ML IV BOLUS
INTRAVENOUS | Status: AC
  Filled 2017-04-13: qty 40

## 2017-04-13 MED ORDER — MIDAZOLAM HCL 2 MG/2ML IJ SOLN
INTRAMUSCULAR | Status: AC
  Filled 2017-04-13: qty 2

## 2017-04-13 MED ORDER — LACTATED RINGERS IV SOLN
INTRAVENOUS | Status: DC
  Administered 2017-04-13 (×2): via INTRAVENOUS

## 2017-04-13 MED ORDER — FENTANYL CITRATE (PF) 100 MCG/2ML IJ SOLN
INTRAMUSCULAR | Status: AC
  Administered 2017-04-13: 50 ug via INTRAVENOUS
  Filled 2017-04-13: qty 2

## 2017-04-13 MED ORDER — POLYETHYLENE GLYCOL 3350 17 G PO PACK: 17.0000 g | PACK | Freq: Every day | ORAL | Status: DC | PRN

## 2017-04-13 MED ORDER — METHOCARBAMOL 500 MG PO TABS: 500.0000 mg | ORAL_TABLET | Freq: Four times a day (QID) | ORAL | Status: DC | PRN

## 2017-04-13 MED ORDER — OXYCODONE HCL 5 MG/5ML PO SOLN: 5.0000 mg | Freq: Once | ORAL | Status: DC | PRN

## 2017-04-13 MED ORDER — ACETAMINOPHEN 650 MG RE SUPP: 650.0000 mg | Freq: Four times a day (QID) | RECTAL | Status: DC | PRN

## 2017-04-13 MED ORDER — CEFAZOLIN SODIUM-DEXTROSE 2-4 GM/100ML-% IV SOLN
2.0000 g | INTRAVENOUS | Status: AC
  Administered 2017-04-13: 2 g via INTRAVENOUS
  Filled 2017-04-13: qty 100

## 2017-04-13 MED ORDER — ROPIVACAINE HCL 7.5 MG/ML IJ SOLN
INTRAMUSCULAR | Status: DC | PRN
  Administered 2017-04-13: 20 mL via PERINEURAL

## 2017-04-13 MED ORDER — ACETAMINOPHEN 325 MG PO TABS
650.0000 mg | ORAL_TABLET | Freq: Four times a day (QID) | ORAL | Status: DC | PRN
  Administered 2017-04-15 (×3): 650 mg via ORAL
  Filled 2017-04-13 (×3): qty 2

## 2017-04-13 MED ORDER — CEFAZOLIN SODIUM-DEXTROSE 2-4 GM/100ML-% IV SOLN
2.0000 g | Freq: Four times a day (QID) | INTRAVENOUS | Status: AC
  Administered 2017-04-13 – 2017-04-14 (×3): 2 g via INTRAVENOUS
  Filled 2017-04-13 (×3): qty 100

## 2017-04-13 MED ORDER — OXYCODONE HCL 5 MG PO TABS: 5.0000 mg | ORAL_TABLET | Freq: Once | ORAL | Status: DC | PRN

## 2017-04-13 MED ORDER — DIPHENHYDRAMINE HCL 12.5 MG/5ML PO ELIX: 12.5000 mg | ORAL_SOLUTION | ORAL | Status: DC | PRN

## 2017-04-13 MED ORDER — CHLORHEXIDINE GLUCONATE 4 % EX LIQD: 60.0000 mL | Freq: Once | CUTANEOUS | Status: DC

## 2017-04-13 MED ORDER — TRANEXAMIC ACID 1000 MG/10ML IV SOLN
1000.0000 mg | INTRAVENOUS | Status: AC
  Administered 2017-04-13: 1000 mg via INTRAVENOUS
  Filled 2017-04-13: qty 1100

## 2017-04-13 MED ORDER — SODIUM CHLORIDE 0.9 % IV SOLN
INTRAVENOUS | Status: AC
  Filled 2017-04-13: qty 500000

## 2017-04-13 MED ORDER — METOCLOPRAMIDE HCL 5 MG/ML IJ SOLN
5.0000 mg | Freq: Three times a day (TID) | INTRAMUSCULAR | Status: DC | PRN
  Administered 2017-04-15: 10 mg via INTRAVENOUS
  Filled 2017-04-13: qty 2

## 2017-04-13 MED ORDER — METOCLOPRAMIDE HCL 5 MG PO TABS: 5.0000 mg | ORAL_TABLET | Freq: Three times a day (TID) | ORAL | Status: DC | PRN

## 2017-04-13 MED ORDER — ONDANSETRON HCL 4 MG/2ML IJ SOLN
4.0000 mg | Freq: Four times a day (QID) | INTRAMUSCULAR | Status: DC | PRN
  Administered 2017-04-14: 4 mg via INTRAVENOUS
  Filled 2017-04-13: qty 2

## 2017-04-13 MED ORDER — POLYMYXIN B SULFATE 500000 UNITS IJ SOLR
INTRAMUSCULAR | Status: DC | PRN
  Administered 2017-04-13: 500 mL

## 2017-04-13 MED ORDER — POLYETHYL GLYCOL-PROPYL GLYCOL 0.4-0.3 % OP SOLN: Freq: Two times a day (BID) | OPHTHALMIC | Status: DC | PRN

## 2017-04-13 MED ORDER — PHENOL 1.4 % MT LIQD: 1.0000 | OROMUCOSAL | Status: DC | PRN

## 2017-04-13 MED ORDER — NEOMYCIN-POLYMYXIN-HC 3.5-10000-1 OT SUSP
4.0000 [drp] | Freq: Three times a day (TID) | OTIC | Status: DC
  Filled 2017-04-13: qty 10

## 2017-04-13 MED ORDER — PHENYLEPHRINE HCL 10 MG/ML IJ SOLN
INTRAMUSCULAR | Status: DC | PRN
  Administered 2017-04-13 (×2): 40 ug via INTRAVENOUS
  Administered 2017-04-13: 80 ug via INTRAVENOUS
  Administered 2017-04-13 (×4): 40 ug via INTRAVENOUS

## 2017-04-13 MED ORDER — BUPIVACAINE IN DEXTROSE 0.75-8.25 % IT SOLN
INTRATHECAL | Status: DC | PRN
  Administered 2017-04-13: 1.8 mL via INTRATHECAL

## 2017-04-13 MED ORDER — DOCUSATE SODIUM 100 MG PO CAPS: 100.0000 mg | ORAL_CAPSULE | Freq: Two times a day (BID) | ORAL | 1 refills | Status: DC | PRN

## 2017-04-13 MED ORDER — HYDROMORPHONE HCL-NACL 0.5-0.9 MG/ML-% IV SOSY: 0.5000 mg | PREFILLED_SYRINGE | INTRAVENOUS | Status: DC | PRN

## 2017-04-13 MED ORDER — VALACYCLOVIR HCL 500 MG PO TABS
500.0000 mg | ORAL_TABLET | Freq: Every day | ORAL | Status: DC | PRN
  Filled 2017-04-13: qty 1

## 2017-04-13 MED ORDER — DEXAMETHASONE SODIUM PHOSPHATE 10 MG/ML IJ SOLN
INTRAMUSCULAR | Status: AC
  Filled 2017-04-13: qty 1

## 2017-04-13 MED ORDER — VITAMIN B-12 1000 MCG PO TABS
1000.0000 ug | ORAL_TABLET | Freq: Every day | ORAL | Status: DC
  Administered 2017-04-14 – 2017-04-16 (×3): 1000 ug via ORAL
  Filled 2017-04-13 (×3): qty 1

## 2017-04-13 MED ORDER — ASPIRIN EC 325 MG PO TBEC: 325.0000 mg | DELAYED_RELEASE_TABLET | Freq: Two times a day (BID) | ORAL | 1 refills | Status: DC

## 2017-04-13 MED ORDER — MAGNESIUM CITRATE PO SOLN: 1.0000 | Freq: Once | ORAL | Status: DC | PRN

## 2017-04-13 MED ORDER — BISACODYL 5 MG PO TBEC: 5.0000 mg | DELAYED_RELEASE_TABLET | Freq: Every day | ORAL | Status: DC | PRN

## 2017-04-13 MED ORDER — PHENYLEPHRINE 40 MCG/ML (10ML) SYRINGE FOR IV PUSH (FOR BLOOD PRESSURE SUPPORT)
PREFILLED_SYRINGE | INTRAVENOUS | Status: AC
  Filled 2017-04-13: qty 10

## 2017-04-13 MED ORDER — ALUM & MAG HYDROXIDE-SIMETH 200-200-20 MG/5ML PO SUSP: 30.0000 mL | ORAL | Status: DC | PRN

## 2017-04-13 SURGICAL SUPPLY — 62 items
AGENT HMST SPONGE THK3/8 (HEMOSTASIS) ×1
BAG SPEC THK2 15X12 ZIP CLS (MISCELLANEOUS)
BAG ZIPLOCK 12X15 (MISCELLANEOUS) IMPLANT
BANDAGE ACE 4X5 VEL STRL LF (GAUZE/BANDAGES/DRESSINGS) ×3 IMPLANT
BANDAGE ACE 6X5 VEL STRL LF (GAUZE/BANDAGES/DRESSINGS) ×3 IMPLANT
BLADE SAG 18X100X1.27 (BLADE) ×3 IMPLANT
BLADE SAW SGTL 11.0X1.19X90.0M (BLADE) ×3 IMPLANT
BLADE SAW SGTL 13.0X1.19X90.0M (BLADE) ×3 IMPLANT
CAPT KNEE TOTAL 3 ATTUNE ×2 IMPLANT
CEMENT HV SMART SET (Cement) ×6 IMPLANT
CLOSURE WOUND 1/2 X4 (GAUZE/BANDAGES/DRESSINGS) ×1
CLOTH 2% CHLOROHEXIDINE 3PK (PERSONAL CARE ITEMS) ×3 IMPLANT
COVER SURGICAL LIGHT HANDLE (MISCELLANEOUS) ×3 IMPLANT
CUFF TOURN SGL QUICK 34 (TOURNIQUET CUFF) ×3
CUFF TRNQT CYL 34X4X40X1 (TOURNIQUET CUFF) ×1 IMPLANT
DECANTER SPIKE VIAL GLASS SM (MISCELLANEOUS) ×3 IMPLANT
DRAPE INCISE IOBAN 66X45 STRL (DRAPES) IMPLANT
DRAPE ORTHO SPLIT 77X108 STRL (DRAPES) ×6
DRAPE SHEET LG 3/4 BI-LAMINATE (DRAPES) ×3 IMPLANT
DRAPE SURG ORHT 6 SPLT 77X108 (DRAPES) ×2 IMPLANT
DRAPE U-SHAPE 47X51 STRL (DRAPES) ×3 IMPLANT
DRSG AQUACEL AG ADV 3.5X10 (GAUZE/BANDAGES/DRESSINGS) ×2 IMPLANT
DRSG TEGADERM 4X4.75 (GAUZE/BANDAGES/DRESSINGS) IMPLANT
DURAPREP 26ML APPLICATOR (WOUND CARE) ×3 IMPLANT
ELECT REM PT RETURN 15FT ADLT (MISCELLANEOUS) ×3 IMPLANT
EVACUATOR 1/8 PVC DRAIN (DRAIN) IMPLANT
GAUZE SPONGE 2X2 8PLY STRL LF (GAUZE/BANDAGES/DRESSINGS) IMPLANT
GLOVE BIOGEL PI IND STRL 7.0 (GLOVE) ×1 IMPLANT
GLOVE BIOGEL PI IND STRL 8 (GLOVE) ×1 IMPLANT
GLOVE BIOGEL PI INDICATOR 7.0 (GLOVE) ×2
GLOVE BIOGEL PI INDICATOR 8 (GLOVE) ×2
GLOVE SURG SS PI 7.0 STRL IVOR (GLOVE) ×3 IMPLANT
GLOVE SURG SS PI 7.5 STRL IVOR (GLOVE) ×3 IMPLANT
GLOVE SURG SS PI 8.0 STRL IVOR (GLOVE) ×6 IMPLANT
GOWN STRL REUS W/TWL XL LVL3 (GOWN DISPOSABLE) ×6 IMPLANT
HANDPIECE INTERPULSE COAX TIP (DISPOSABLE) ×3
HEMOSTAT SPONGE AVITENE ULTRA (HEMOSTASIS) ×3 IMPLANT
IMMOBILIZER KNEE 20 (SOFTGOODS) ×3
IMMOBILIZER KNEE 20 THIGH 36 (SOFTGOODS) ×1 IMPLANT
MANIFOLD NEPTUNE II (INSTRUMENTS) ×3 IMPLANT
NS IRRIG 1000ML POUR BTL (IV SOLUTION) IMPLANT
PACK TOTAL KNEE CUSTOM (KITS) ×3 IMPLANT
POSITIONER SURGICAL ARM (MISCELLANEOUS) ×3 IMPLANT
SET HNDPC FAN SPRY TIP SCT (DISPOSABLE) ×1 IMPLANT
SPONGE GAUZE 2X2 STER 10/PKG (GAUZE/BANDAGES/DRESSINGS)
SPONGE SURGIFOAM ABS GEL 100 (HEMOSTASIS) IMPLANT
STAPLER VISISTAT (STAPLE) ×2 IMPLANT
STRIP CLOSURE SKIN 1/2X4 (GAUZE/BANDAGES/DRESSINGS) ×1 IMPLANT
SUT BONE WAX W31G (SUTURE) IMPLANT
SUT MNCRL AB 4-0 PS2 18 (SUTURE) IMPLANT
SUT STRATAFIX 0 PDS 27 VIOLET (SUTURE) ×3
SUT VIC AB 1 CT1 27 (SUTURE) ×6
SUT VIC AB 1 CT1 27XBRD ANTBC (SUTURE) ×2 IMPLANT
SUT VIC AB 2-0 CT1 27 (SUTURE) ×9
SUT VIC AB 2-0 CT1 TAPERPNT 27 (SUTURE) ×3 IMPLANT
SUTURE STRATFX 0 PDS 27 VIOLET (SUTURE) ×1 IMPLANT
SYR 50ML LL SCALE MARK (SYRINGE) IMPLANT
TOWER CARTRIDGE SMART MIX (DISPOSABLE) ×3 IMPLANT
TRAY FOLEY CATH 14FR (SET/KITS/TRAYS/PACK) ×2 IMPLANT
WATER STERILE IRR 1500ML POUR (IV SOLUTION) ×3 IMPLANT
WRAP KNEE MAXI GEL POST OP (GAUZE/BANDAGES/DRESSINGS) ×3 IMPLANT
YANKAUER SUCT BULB TIP 10FT TU (MISCELLANEOUS) ×3 IMPLANT

## 2017-04-13 NOTE — Evaluation (Signed)
Physical Therapy Evaluation Patient Details [name] MRN: [id number] DOB: [birth year] Today's Date: 04/13/2017   History of Present Illness  Pt s/p R TKR and with hx of back surgery  Clinical Impression  Pt s/p R TKR and presents with decreased R LE strength/ROM and post op pain limiting functional mobility.  Pt should progress to dc home with assist of family prn and HHPT follow up.    Follow Up Recommendations Home health PT    Equipment Recommendations  Rolling walker with 5" wheels;3in1 (PT)    Recommendations for Other Services OT consult     Precautions / Restrictions Precautions Precautions: Knee Required Braces or Orthoses: Knee Immobilizer - Left Knee Immobilizer - Left: Discontinue once straight leg raise with < 10 degree lag Restrictions Weight Bearing Restrictions: No Other Position/Activity Restrictions: WBAT      Mobility  Bed Mobility Overal bed mobility: Needs Assistance Bed Mobility: Supine to Sit     Supine to sit: Min assist     General bed mobility comments: cues for sequence and use of L LE to self assist  Transfers Overall transfer level: Needs assistance Equipment used: Rolling walker (2 wheeled) Transfers: Sit to/from Stand Sit to Stand: Min assist         General transfer comment: cues for LE management and use of UEs to self assist  Ambulation/Gait Ambulation/Gait assistance: Min assist Ambulation Distance (Feet): 10 Feet Assistive device: Rolling walker (2 wheeled) Gait Pattern/deviations: Step-to pattern;Decreased step length - right;Decreased step length - left;Shuffle;Trunk flexed Gait velocity: decr Gait velocity interpretation: Below normal speed for age/gender General Gait Details: cues for sequence, posture, position from RW and increased UE WB  Stairs            Wheelchair Mobility    Modified Rankin (Stroke Patients Only)       Balance                                              Pertinent Vitals/Pain Pain Assessment: 0-10 Pain Score: 4  Pain Location: R knee Pain Descriptors / Indicators: Sore Pain Intervention(s): Limited activity within patient's tolerance;Monitored during session;Premedicated before session;Ice applied    Home Living Family/patient expects to be discharged to:: Private residence Living Arrangements: Alone Available Help at Discharge: Family Type of Home: Apartment Home Access: Stairs to enter Entrance Stairs-Rails: Right;Left;Can reach both Technical brewer of Steps: 9 Home Layout: One level   Additional Comments: Pt states son can stay with her overnight as needed    Prior Function Level of Independence: Independent               Hand Dominance        Extremity/Trunk Assessment   Upper Extremity Assessment Upper Extremity Assessment: Overall WFL for tasks assessed    Lower Extremity Assessment Lower Extremity Assessment: RLE deficits/detail    Cervical / Trunk Assessment Cervical / Trunk Assessment: Normal  Communication   Communication: No difficulties  Cognition Arousal/Alertness: Awake/alert Behavior During Therapy: WFL for tasks assessed/performed Overall Cognitive Status: Within Functional Limits for tasks assessed                                        General Comments      Exercises     Assessment/Plan  PT Assessment Patient needs continued PT services  PT Problem List Decreased strength;Decreased range of motion;Decreased activity tolerance;Decreased mobility;Decreased knowledge of use of DME;Pain;Decreased knowledge of precautions       PT Treatment Interventions DME instruction;Gait training;Stair training;Functional mobility training;Therapeutic activities;Therapeutic exercise;Patient/family education    PT Goals (Current goals can be found in the Care Plan section)  Acute Rehab PT Goals Patient Stated Goal: Regain IND ASAP PT Goal Formulation: With  patient Time For Goal Achievement: 04/18/17 Potential to Achieve Goals: Good    Frequency 7X/week   Barriers to discharge        Co-evaluation               AM-PAC PT "6 Clicks" Daily Activity  Outcome Measure Difficulty turning over in bed (including adjusting bedclothes, sheets and blankets)?: Total Difficulty moving from lying on back to sitting on the side of the bed? : Total Difficulty sitting down on and standing up from a chair with arms (e.g., wheelchair, bedside commode, etc,.)?: Total Help needed moving to and from a bed to chair (including a wheelchair)?: A Little Help needed walking in hospital room?: A Little Help needed climbing 3-5 steps with a railing? : A Lot 6 Click Score: 11    End of Session Equipment Utilized During Treatment: Gait belt;Right knee immobilizer Activity Tolerance: Patient tolerated treatment well;Patient limited by fatigue Patient left: in bed;with call bell/phone within reach;with family/visitor present Nurse Communication: Mobility status PT Visit Diagnosis: Unsteadiness on feet (R26.81);Difficulty in walking, not elsewhere classified (R26.2)    Time: 5170-0174 PT Time Calculation (min) (ACUTE ONLY): 32 min   Charges:   PT Evaluation $PT Eval Low Complexity: 1 Low PT Treatments $Gait Training: 8-22 mins   PT G Codes:        Pg [phone]   Rayanna Matusik 04/13/2017, 3:53 PM

## 2017-04-13 NOTE — Brief Op Note (Signed)
04/13/2017  12:26 PM  PATIENT:  Linda Watson  81 y.o. female  PRE-OPERATIVE DIAGNOSIS:  DJD right knee  POST-OPERATIVE DIAGNOSIS:  DJD right knee  PROCEDURE:  Procedure(s) with comments: RIGHT TOTAL KNEE ARTHROPLASTY (Right) - 120 mins  SURGEON:  Surgeon(s) and Role:    Susa Day, MD - Primary  PHYSICIAN ASSISTANT:   ASSISTANTS: Bissell   ANESTHESIA:   spinal and general  EBL:  Total I/O In: 1000 [I.V.:1000] Out: 500 [Urine:450; Blood:50]  BLOOD ADMINISTERED:none  DRAINS: none   LOCAL MEDICATIONS USED:  MARCAINE     SPECIMEN:  No Specimen  DISPOSITION OF SPECIMEN:  N/A  COUNTS:  YES  TOURNIQUET:   Total Tourniquet Time Documented: Thigh (Right) - 64 minutes Total: Thigh (Right) - 64 minutes   DICTATION: .Other Dictation: Dictation Number [id number]  PLAN OF CARE: Admit to inpatient   PATIENT DISPOSITION:  PACU - hemodynamically stable.   Delay start of Pharmacological VTE agent (>24hrs) due to surgical blood loss or risk of bleeding: no

## 2017-04-13 NOTE — Anesthesia Procedure Notes (Signed)
Anesthesia Regional Block: Adductor canal block   Pre-Anesthetic Checklist: ,, timeout performed, Correct Patient, Correct Site, Correct Laterality, Correct Procedure, Correct Position, site marked, Risks and benefits discussed,  Surgical consent,  Pre-op evaluation,  At surgeon's request and post-op pain management  Laterality: Lower and Right  Prep: chloraprep       Needles:  Injection technique: Single-shot  Needle Type: Echogenic Stimulator Needle          Additional Needles:   Procedures: ultrasound guided,,,,,,,,  Narrative:  Start time: 04/13/2017 10:01 AM End time: 04/13/2017 10:05 AM Injection made incrementally with aspirations every 5 mL.  Performed by: Personally  Anesthesiologist: Maliah Pyles  Additional Notes: H+P and labs reviewed, risks and benefits discussed with patient, procedure tolerated well without complications

## 2017-04-13 NOTE — Anesthesia Procedure Notes (Signed)
Procedure Name: MAC Date/Time: 04/13/2017 10:29 AM Performed by: West Pugh Pre-anesthesia Checklist: Patient identified, Emergency Drugs available, Suction available, Patient being monitored and Timeout performed Oxygen Delivery Method: Simple face mask Placement Confirmation: positive ETCO2 Dental Injury: Teeth and Oropharynx as per pre-operative assessment

## 2017-04-13 NOTE — Transfer of Care (Signed)
Immediate Anesthesia Transfer of Care Note  Patient: Linda Watson  Procedure(s) Performed: Procedure(s) with comments: RIGHT TOTAL KNEE ARTHROPLASTY (Right) - 120 mins  Patient Location: PACU  Anesthesia Type:Spinal  Level of Consciousness:  sedated, patient cooperative and responds to stimulation  Airway & Oxygen Therapy:Patient Spontanous Breathing and Patient connected to face mask oxgen  Post-op Assessment:  Report given to PACU RN and Post -op Vital signs reviewed and stable  Post vital signs:  Reviewed and stable  Last Vitals:  Vitals:   04/13/17 1010 04/13/17 1012  BP:    Pulse: (!) 54 60  Resp: 11 (!) 24  Temp:    SpO2: 103% 159%    Complications: No apparent anesthesia complications

## 2017-04-13 NOTE — Anesthesia Preprocedure Evaluation (Signed)
Anesthesia Evaluation  Patient identified by MRN, date of birth, ID band Patient awake    Reviewed: Allergy & Precautions, H&P , NPO status , Patient's Chart, lab work & pertinent test results  History of Anesthesia Complications (+) PONV and history of anesthetic complications  Airway Mallampati: II  TM Distance: <3 FB Neck ROM: full    Dental  (+) Dental Advisory Given, Missing,    Pulmonary neg pulmonary ROS,    Pulmonary exam normal breath sounds clear to auscultation       Cardiovascular Exercise Tolerance: Good negative cardio ROS Normal cardiovascular exam Rhythm:regular Rate:Normal     Neuro/Psych negative neurological ROS     GI/Hepatic negative GI ROS, Neg liver ROS, GERD  Medicated and Controlled,  Endo/Other  negative endocrine ROS  Renal/GU negative Renal ROS  negative genitourinary   Musculoskeletal   Abdominal   Peds  Hematology negative hematology ROS (+)   Anesthesia Other Findings   Reproductive/Obstetrics                             Anesthesia Physical Anesthesia Plan  ASA: II  Anesthesia Plan: Regional, Spinal and MAC   Post-op Pain Management:    Induction:   PONV Risk Score and Plan: 3 and Ondansetron and Dexamethasone  Airway Management Planned: Nasal Cannula  Additional Equipment: None  Intra-op Plan:   Post-operative Plan:   Informed Consent: I have reviewed the patients History and Physical, chart, labs and discussed the procedure including the risks, benefits and alternatives for the proposed anesthesia with the patient or authorized representative who has indicated his/her understanding and acceptance.   Dental advisory given  Plan Discussed with: CRNA and Surgeon  Anesthesia Plan Comments:         Anesthesia Quick Evaluation

## 2017-04-13 NOTE — Anesthesia Postprocedure Evaluation (Signed)
Anesthesia Post Note  Patient: MALYNA BUDNEY  Procedure(s) Performed: Procedure(s) (LRB): RIGHT TOTAL KNEE ARTHROPLASTY (Right)     Patient location during evaluation: PACU Anesthesia Type: Regional, MAC and Spinal Level of consciousness: awake and alert Pain management: pain level controlled Vital Signs Assessment: post-procedure vital signs reviewed and stable Respiratory status: spontaneous breathing, nonlabored ventilation, respiratory function stable and patient connected to nasal cannula oxygen Cardiovascular status: blood pressure returned to baseline and stable Postop Assessment: no signs of nausea or vomiting and spinal receding Anesthetic complications: no    Last Vitals:  Vitals:   04/13/17 1415 04/13/17 1530  BP: (!) 125/56 (!) 111/93  Pulse: 60 62  Resp: 16 18  Temp: 36.6 C (!) 36.4 C  SpO2: 100% 97%    Last Pain:  Vitals:   04/13/17 1530  TempSrc: Axillary  PainSc:                  Shaneka Efaw

## 2017-04-13 NOTE — Progress Notes (Signed)
AssistedDr. Moser with right, ultrasound guided, adductor canal block. Side rails up, monitors on throughout procedure. See vital signs in flow sheet. Tolerated Procedure well.  

## 2017-04-13 NOTE — Anesthesia Procedure Notes (Signed)
Spinal  Patient location during procedure: OR Start time: 04/13/2017 10:38 AM End time: 04/13/2017 10:44 AM Staffing Anesthesiologist: Oleta Mouse Preanesthetic Checklist Completed: patient identified, surgical consent, pre-op evaluation, timeout performed, IV checked, risks and benefits discussed and monitors and equipment checked Spinal Block Patient position: sitting Prep: site prepped and draped and DuraPrep Patient monitoring: heart rate, cardiac monitor, continuous pulse ox and blood pressure Approach: midline Location: L4-5 Injection technique: single-shot Needle Needle type: Pencan  Needle gauge: 24 G Needle length: 10 cm Assessment Sensory level: T6

## 2017-04-13 NOTE — Discharge Instructions (Signed)

## 2017-04-13 NOTE — H&P (View-Only) (Signed)
Linda Watson DOB: April 30, 1936 Widowed / Language: [name] / Race: White Female  H&P Date: 03/16/17  Chief Complaint: Right knee pain  History of Present Illness The patient is a 81 year old female who comes in today for a preoperative History and Physical. The patient is scheduled for a right total knee arthroplasty to be performed by Dr. Johnn Hai, MD at [facility] on March 23, 2017 . Linda Watson reports progressively worsening R knee pain refractory to conservative tx including cortisone, viscosupplementation, ice, elevation, medications, relative rest, activity modification, quad strengthening. Her pain is limiting ADLs and interfering with quality of life at this point. She desires to proceed with surgery.  Dr. Tonita Cong and the patient mutually agreed to proceed with a total knee replacement. Risks and benefits of the procedure were discussed including stiffness, suboptimal range of motion, persistent pain, infection requiring removal of prosthesis and reinsertion, need for prophylactic antibiotics in the future, for example, dental procedures, possible need for manipulation, revision in the future and also anesthetic complications including DVT, PE, etc. We discussed the perioperative course, time in the hospital, postoperative recovery and the need for elevation to control swelling. We also discussed the predicted range of motion and the probability that squatting and kneeling would be unobtainable in the future. In addition, postoperative anticoagulation was discussed. We have obtained preoperative medical clearance as necessary. Provided illustrated handout and discussed it in detail. They will enroll in the total joint replacement educational forum at the hospital  Problem List/Past Medical Hx Chronic left shoulder pain (M25.512)  Chronic pain of right knee (M25.561)  Shoulder impingement, left (M75.42)  Primary osteoarthritis of left knee (M17.12)  Lumbar strain (S39.012A)  [06/29/1992]: Gastroesophageal Reflux Disease   Allergies OxyCODONE HCl *ANALGESICS - OPIOID*   Family History First Degree Relatives  Cerebrovascular Accident  sister Osteoarthritis  sister Heart Disease  mother and father Cancer  sister Kidney disease  sister Hypertension  sister  Social History Tobacco use  never smoker Exercise  Exercises rarely; does other Drug/Alcohol Rehab (Currently)  no Alcohol use  never consumed alcohol Current work status  retired Living situation  live alone, apartment, 9 steps to enter Illicit drug use  no Drug/Alcohol Rehab (Previously)  no Marital status  widowed Pain Contract  no Number of flights of stairs before winded  less than 1 Children  2 Advance Directives  none Post-Surgical Plans  short term rehab stay vs. home with HHPT (lives alone)  Medication History  Meloxicam (7.5MG  Tablet, Oral) Active. Omeprazole (40MG  Capsule DR, Oral) Active. Pantoprazole Sodium (20MG  Tablet DR, Oral) Active. Vitamin B12 TR (1000MCG Tablet ER, Oral) Active. Biotin (10000MCG Tablet, Oral) Active. Glucosamine HCl (1000MG  Tablet, Oral) Active. Magnesium (250MG  Tablet, Oral) Active. Polyethylene Glycol 3350 (Oral) Active. ValACYclovir HCl (500MG  Tablet, Oral) Active.  Past Surgical History Arthroscopy of Knee  bilateral Hysterectomy  complete (cancerous) Tonsillectomy  Appendectomy  Arthroscopy of Shoulder  left Cataract Surgery  bilateral Gallbladder Surgery  open  Review of Systems General Not Present- Chills, Fatigue, Fever, Memory Loss, Night Sweats, Weight Gain and Weight Loss. Skin Not Present- Eczema, Hives, Itching, Lesions and Rash. HEENT Present- Headache and Tinnitus. Not Present- Dentures, Double Vision, Hearing Loss and Visual Loss. Respiratory Not Present- Allergies, Chronic Cough, Coughing up blood, Shortness of breath at rest and Shortness of breath with exertion. Cardiovascular Not  Present- Chest Pain, Difficulty Breathing Lying Down, Murmur, Palpitations, Racing/skipping heartbeats and Swelling. Gastrointestinal Present- Constipation. Not Present- Abdominal Pain, Bloody Stool, Diarrhea, Difficulty  Swallowing, Heartburn, Jaundice, Loss of appetitie, Nausea and Vomiting. Female Genitourinary Not Present- Blood in Urine, Discharge, Flank Pain, Incontinence, Painful Urination, Urgency, Urinary frequency, Urinary Retention, Urinating at Night and Weak urinary stream. Musculoskeletal Present- Back Pain and Morning Stiffness. Not Present- Joint Pain, Joint Swelling, Muscle Pain, Muscle Weakness and Spasms. Neurological Not Present- Blackout spells, Difficulty with balance, Dizziness, Paralysis, Tremor and Weakness. Psychiatric Not Present- Insomnia.  Physical Exam General Mental Status -Alert, cooperative and good historian. General Appearance-pleasant, Not in acute distress. Orientation-Oriented X3. Build & Nutrition-Well nourished and Well developed.  Head and Neck Head-normocephalic, atraumatic . Neck Global Assessment - supple, no bruit auscultated on the right, no bruit auscultated on the left.  Eye Pupil - Bilateral-Regular and Round. Motion - Bilateral-EOMI.  Chest and Lung Exam Auscultation Breath sounds - clear at anterior chest wall and clear at posterior chest wall. Adventitious sounds - No Adventitious sounds.  Cardiovascular Auscultation Rhythm - Regular rate and rhythm. Heart Sounds - S1 WNL and S2 WNL. Murmurs & Other Heart Sounds - Auscultation of the heart reveals - No Murmurs.  Abdomen Palpation/Percussion Tenderness - Abdomen is non-tender to palpation. Rigidity (guarding) - Abdomen is soft. Auscultation Auscultation of the abdomen reveals - Bowel sounds normal.  Female Genitourinary Not done, not pertinent to present illness  Musculoskeletal On exam, moderate distress. Walks with an antalgic gait. Mood and affect is  appropriate. Knee is exquisitely tender in medial joint line, varus deformity, patellofemoral pain with compression. Range is -5 to 110. Ipsilateral hip and ankle exam is unremarkable.  Imaging AP and lateral of the knee demonstrates bone-on-bone arthrosis, medial compartment, patellofemoral joint.  Assessment & Plan Primary osteoarthritis of right knee (M17.11)  Pt with end-stage right knee DJD, bone-on-bone, refractory to conservative tx, scheduled for total knee replacement by Dr. Tonita Cong. We again discussed the procedure itself as well as risks, complications and alternatives, including but not limited to DVT, PE, infx, bleeding, failure of procedure, need for secondary procedure including manipulation, nerve injury, ongoing pain/symptoms, anesthesia risk, even stroke or death. Also discussed typical post-op protocols, activity restrictions, need for PT, flexion/extension exercises, time out of work. Discussed need for DVT ppx post-op per protocol. Discussed dental ppx and infx prevention. Also discussed limitations post-operatively such as kneeling and squatting. All questions were answered. Patient desires to proceed with surgery as scheduled. Will hold supplements, ASA and NSAIDs accordingly. Will remain NPO after MN night before surgery. Will present to [facility] for pre-op testing. Anticipate hospital stay to include at least 2 midnights given medical history and to ensure proper pain control. Plan ASA 325mg  BID for DVT ppx post-op. Plan tramadol, Colace, Miralax. Plan possible short rehav stay vs home with HHPT (she lives alone). Will follow up 10-14 days post-op for suture removal and xrays.  Plan right total knee replacement  Signed electronically by Cecilie Kicks, PA-C for Dr. Tonita Cong

## 2017-04-13 NOTE — Interval H&P Note (Signed)
History and Physical Interval Note:  04/13/2017 9:50 AM  Linda Watson  has presented today for surgery, with the diagnosis of DJD right knee  The various methods of treatment have been discussed with the patient and family. After consideration of risks, benefits and other options for treatment, the patient has consented to  Procedure(s) with comments: RIGHT TOTAL KNEE ARTHROPLASTY (Right) - 120 mins as a surgical intervention .  The patient's history has been reviewed, patient examined, no change in status, stable for surgery.  I have reviewed the patient's chart and labs.  Questions were answered to the patient's satisfaction.     Linda Watson C

## 2017-04-14 LAB — BASIC METABOLIC PANEL
ANION GAP: 7 (ref 5–15)
BUN: 12 mg/dL (ref 6–20)
CALCIUM: 8.6 mg/dL — AB (ref 8.9–10.3)
CO2: 28 mmol/L (ref 22–32)
Chloride: 101 mmol/L (ref 101–111)
Creatinine, Ser: 0.86 mg/dL (ref 0.44–1.00)
GFR calc non Af Amer: >60 mL/min (ref >60)
Glucose, Bld: 121 mg/dL — ABNORMAL HIGH (ref 65–99)
Potassium: 4.1 mmol/L (ref 3.5–5.1)
SODIUM: 136 mmol/L (ref 135–145)

## 2017-04-14 LAB — CBC
HCT: 32.7 % — ABNORMAL LOW (ref 36.0–46.0)
Hemoglobin: 10.9 g/dL — ABNORMAL LOW (ref 12.0–15.0)
MCH: 29.9 pg (ref 26.0–34.0)
MCHC: 33.3 g/dL (ref 30.0–36.0)
MCV: 89.6 fL (ref 78.0–100.0)
Platelets: 220 10*3/uL (ref 150–400)
RBC: 3.65 MIL/uL — AB (ref 3.87–5.11)
RDW: 14.5 % (ref 11.5–15.5)
WBC: 14.2 10*3/uL — ABNORMAL HIGH (ref 4.0–10.5)

## 2017-04-14 NOTE — Progress Notes (Signed)
Patient ID: Linda Watson, female   DOB: 1935-11-06, 81 y.o.   MRN: [id number] Subjective: 1 Day Post-Op Procedure(s) (LRB): RIGHT TOTAL KNEE ARTHROPLASTY (Right) Patient reports pain as moderate.    Patient has complaints of R knee pain  We will start therapy today. Plan is to go home with HHPT vs. Short term rehab after hospital stay.  Objective: Vital signs in last 24 hours: Temp:  [97.5 F (36.4 C)-98.5 F (36.9 C)] 98.5 F (36.9 C) (08/17 0404) Pulse Rate:  [47-68] 54 (08/16 2130) Resp:  [9-24] 18 (08/16 2130) BP: (102-147)/(50-99) 103/50 (08/17 0404) SpO2:  [95 %-100 %] 95 % (08/17 0404) Weight:  [62.6 kg (138 lb)] 62.6 kg (138 lb) (08/16 0900)  Intake/Output from previous day:  Intake/Output Summary (Last 24 hours) at 04/14/17 0750 Last data filed at 04/14/17 0530  Gross per 24 hour  Intake             2770 ml  Output             2050 ml  Net              720 ml    Intake/Output this shift: No intake/output data recorded.  Labs: Results for orders placed or performed during the hospital encounter of 04/13/17  CBC  Result Value Ref Range   WBC 14.2 (H) 4.0 - 10.5 K/uL   RBC 3.65 (L) 3.87 - 5.11 MIL/uL   Hemoglobin 10.9 (L) 12.0 - 15.0 g/dL   HCT 32.7 (L) 36.0 - 46.0 %   MCV 89.6 78.0 - 100.0 fL   MCH 29.9 26.0 - 34.0 pg   MCHC 33.3 30.0 - 36.0 g/dL   RDW 14.5 11.5 - 15.5 %   Platelets 220 150 - 400 K/uL  Basic metabolic panel  Result Value Ref Range   Sodium 136 135 - 145 mmol/L   Potassium 4.1 3.5 - 5.1 mmol/L   Chloride 101 101 - 111 mmol/L   CO2 28 22 - 32 mmol/L   Glucose, Bld 121 (H) 65 - 99 mg/dL   BUN 12 6 - 20 mg/dL   Creatinine, Ser 0.86 0.44 - 1.00 mg/dL   Calcium 8.6 (L) 8.9 - 10.3 mg/dL   GFR calc non Af Amer >60 >60 mL/min   GFR calc Af Amer >60 >60 mL/min   Anion gap 7 5 - 15    Exam - Neurologically intact ABD soft Neurovascular intact Sensation intact distally Intact pulses distally Dorsiflexion/Plantar flexion intact Incision:  dressing C/D/I and scant drainage No cellulitis present Compartment soft no calf pain or sign of DVT Dressing - clean, dry, minimal dried bloody drainage distal aspect of aquacel dressing Motor function intact - moving foot and toes well on exam.   Assessment/Plan: 1 Day Post-Op Procedure(s) (LRB): RIGHT TOTAL KNEE ARTHROPLASTY (Right)  Advance diet Up with therapy D/C IV fluids Past Medical History:  Diagnosis Date  . Arthritis    RIGHT KNEE PAIN AND ARTHRITIS  . Bee sting    left anterior hand, states she was stung yesterday 03-16-17, only c/o mild pain, apparent redness and edema observed; denies any severe allergy to been venom (ie no tongue swelling or throat closing); RN encouraged pt to see her primary to r/o worsening or s/s of infection ; patient agreeable to this   . Fracture, patella    01/21/12 - immobilizer x one month   . GERD (gastroesophageal reflux disease)   . Hemorrhoids   . History of shingles   .  Lichen planus    LESIONS  IN MOUTH AND OCCASIONALLY GENITAL AREA  . PONV (postoperative nausea and vomiting)   . Spinal stenosis    PAIN IN LOWER BACK AND DOWN LEFT LEG -ALSO HAS NUMBNESS AND TINGLING IN LEFT LEG TO FOOT    DVT Prophylaxis - ASA 325mg  BID Protocol Weight-Bearing as tolerated to Right leg No vaccines. Plan D/C Sat/Sun depending on progress, home with HHPT vs. Short term rehab stay as she lives alone Will discuss with Dr. Mliss Fritz, [place]. 04/14/2017, 7:50 AM

## 2017-04-14 NOTE — Progress Notes (Signed)
Physical Therapy Treatment Patient Details [name] MRN: [id number] DOB: 1936/02/03 Today's Date: 04/14/2017    History of Present Illness Pt s/p R TKR and with hx of back surgery    PT Comments    Pt cooperative but continues ltd by fatigue/pain and expressing concern regarding return home with ltd assist.  Pt would like to consider ST SNF level rehab dependent on acute stay progress.  Follow Up Recommendations  Home health PT;SNF (dependent on acute stay progress)     Equipment Recommendations  Rolling walker with 5" wheels;3in1 (PT)    Recommendations for Other Services OT consult     Precautions / Restrictions Precautions Precautions: Knee Required Braces or Orthoses: Knee Immobilizer - Left Knee Immobilizer - Left: Discontinue once straight leg raise with < 10 degree lag Restrictions Weight Bearing Restrictions: No Other Position/Activity Restrictions: WBAT    Mobility  Bed Mobility Overal bed mobility: Needs Assistance Bed Mobility: Supine to Sit     Supine to sit: Min assist     General bed mobility comments: min A for RLE and cues for sequence  Transfers Overall transfer level: Needs assistance Equipment used: Rolling walker (2 wheeled) Transfers: Sit to/from Stand Sit to Stand: Min assist         General transfer comment: cues for UE/LE placement  Ambulation/Gait Ambulation/Gait assistance: Min assist Ambulation Distance (Feet): 54 Feet Assistive device: Rolling walker (2 wheeled) Gait Pattern/deviations: Step-to pattern;Decreased step length - right;Decreased step length - left;Shuffle;Trunk flexed Gait velocity: decr Gait velocity interpretation: Below normal speed for age/gender General Gait Details: cues for sequence, posture, position from RW and increased UE WB   Stairs            Wheelchair Mobility    Modified Rankin (Stroke Patients Only)       Balance                                            Cognition Arousal/Alertness: Awake/alert Behavior During Therapy: WFL for tasks assessed/performed Overall Cognitive Status: Within Functional Limits for tasks assessed                                        Exercises Total Joint Exercises Ankle Circles/Pumps: AROM;Both;15 reps;Supine Quad Sets: AROM;Both;10 reps;Supine Heel Slides: AAROM;Right;10 reps;Supine Straight Leg Raises: AAROM;Right;10 reps;Supine Goniometric ROM: AAROM R knee -10- 25    General Comments        Pertinent Vitals/Pain Pain Assessment: 0-10 Pain Score: 10-Worst pain ever (but pt smiling with statement) Pain Location: R knee Pain Descriptors / Indicators: Sore Pain Intervention(s): Limited activity within patient's tolerance;Monitored during session;Premedicated before session;Patient requesting pain meds-RN notified    Home Living                      Prior Function            PT Goals (current goals can now be found in the care plan section) Acute Rehab PT Goals Patient Stated Goal: Regain IND ASAP PT Goal Formulation: With patient Time For Goal Achievement: 04/18/17 Potential to Achieve Goals: Good Progress towards PT goals: Progressing toward goals    Frequency    7X/week      PT Plan Discharge plan needs to be updated  Co-evaluation              AM-PAC PT "6 Clicks" Daily Activity  Outcome Measure  Difficulty turning over in bed (including adjusting bedclothes, sheets and blankets)?: Unable Difficulty moving from lying on back to sitting on the side of the bed? : Unable Difficulty sitting down on and standing up from a chair with arms (e.g., wheelchair, bedside commode, etc,.)?: Unable Help needed moving to and from a bed to chair (including a wheelchair)?: A Little Help needed walking in hospital room?: A Little Help needed climbing 3-5 steps with a railing? : A Lot 6 Click Score: 11    End of Session Equipment Utilized During Treatment:  Gait belt;Right knee immobilizer Activity Tolerance: Patient limited by fatigue;Patient limited by pain Patient left: in bed;with call bell/phone within reach;with family/visitor present Nurse Communication: Mobility status PT Visit Diagnosis: Unsteadiness on feet (R26.81);Difficulty in walking, not elsewhere classified (R26.2)     Time: 0354-6568 PT Time Calculation (min) (ACUTE ONLY): 21 min  Charges:  $Gait Training: 8-22 mins $Therapeutic Exercise: 8-22 mins                    G Codes:       Pg [phone]    Marilyn Wing 04/14/2017, 1:33 PM

## 2017-04-14 NOTE — Op Note (Signed)
NAME:  Linda Watson, MILAN                    ACCOUNT NO.:  MEDICAL RECORD NO.:  [id number]  LOCATION:                                 FACILITY:  PHYSICIAN:  Susa Day, M.D.         DATE OF BIRTH:  DATE OF PROCEDURE:  04/13/2017 DATE OF DISCHARGE:                              OPERATIVE REPORT   PREOPERATIVE DIAGNOSIS:  Degenerative joint disease, end-stage; valgus deformity of the right knee.  POSTOPERATIVE DIAGNOSIS:  Degenerative joint disease, end-stage; valgus deformity of the right knee.  PROCEDURE PERFORMED:  Right total knee arthroplasty.  COMPONENTS:  DePuy Attune 6 femur, 6 tibia, 5-mm insert, 38 patella.  ANESTHESIA:  Spinal.  ASSISTANT:  Cleophas Dunker, PA.  HISTORY:  A 81, bone-on-bone valgus deformity refractory to conservative treatment, negative affect her activities of daily living, failed therapy, activity modification, injections, indicated for replacement of degenerated joint.  Risks and benefits were discussed including bleeding, infection, damage to the neurovascular structures, suboptimal range of motion, DVT, PE, anesthetic complications, need for revision, etc.  TECHNIQUE:  With the patient in supine position after the induction of adequate spinal anesthesia, 2 g of Kefzol, right lower extremity was prepped and draped, and exsanguinated in usual sterile fashion.  Thigh tourniquet inflated to 250 mmHg.  Midline incision was made over the knee.  Full-thickness flaps developed.  Median parapatellar arthrotomy performed.  Patella everted.  Knee flexed.  Tricompartmental severe osteoarthrosis was noted.  Evacuated clear synovial fluid.  Remnants of the medial and lateral menisci were removed.  Osteophytes were removed as well.  Step drill utilized to enter the femoral canal was irrigated, 5-degree right was utilized with 9 off the distal femur.  This was then pinned.  An oscillating saw performed our distal femoral cut.  Next, we sized the femur.   We tried a 7 and a 6; 6 seemed to be satisfactory, has had multiple osteophytes.  This was pinned, 3 degrees of external rotation.  We placed our cutting block, performed anterior, posterior and chamfer cuts.  Did not notch the cortex.  This was sized off the anterior cortex.  We then turned our attention toward the tibia, was subluxed.  We again protected our posterior soft tissues with a curved Crego.  We removed osteophytes posteriorly.  We subluxed the tibia, measured it to a 6 just the medial aspect of the tibial tubercle. This was then drilled, harvested the bone from the proximal tibial canal.  We drilled, used our punch guide and placed a trial of tibia.  We had subluxed the tibia.  We used our external alignment guide prior to our cut, 0 off the defect posterolaterally, which was 2.0.  Measuring 2-3 medially.  Bisecting the tibiotalar joint parallel to the shaft, 3- degree slope.  This was seemed to be satisfactory.  We performed our cut just raising the posterolateral tibial plateau.  Small drill holes were placed in the 1.5 x 1.5-cm area of sclerotic bone.  We then checked our extension gap, it was equivalent, full extension and stable.  I then trialed a 6 to the medial aspect of tibial tubercle, then did our central drilling,  a punch guide and harvested our bone from the tibia. We left the trial tibia inserted.  We turned attention back to the femur, performed our box cut after placing the jig bisecting the canal. Placed a trial femur and drilling our lug holes.  Excellent fit was noted.  We placed a 5 insert, reduced it, had full extension, full flexion, good stability, varus and valgus stressing 0-30 degrees.  I turned attention toward the patella, everted, measured it at a 23, planed it 9.5 from that.  Residual was 15.  Used a 38 patella.  There was some cartilage that was curetted from the lateral facet.  Removed osteophytes.  We drilled the PEG holes medializing them  after templating them in the appropriate fashion parallel with the joint line.  Placed a 38 patella, reduced it, had excellent patellofemoral tracking.  Normal patellofemoral tracking.  No instability, varus or valgus stressing, negative anterior drawer.  All instrumentations were removed.  We checked posteriorly, popliteus was intact.  Residual osteophytes were removed, cauterized the geniculates.  Residual menisci removed. Pulsatile lavage was used in the joint, knee was flexed, all surfaces well dried, mixed cement the back table in appropriate fashion, injected in the tibia and digitally pressurized and impacted the 6 tibial tray, redundant cement removed and cemented the femur, redundant cement removed, placed a trial insert, reduced it, held an axial load in extension throughout the curing of the cement.  We cemented and clamped the patella as well.  Redundant cement removed.  We injected 0.25% Marcaine with epinephrine in the periosteum of the distal femur and the posterior joint without appropriate curing of the cement.  We then flexed the knee, meticulously removed all redundant cement.  We trialed it.  We felt that the 6 was her best insert.  That was removed and again, redundant cement noted was removed.  Copiously irrigated with pulsatile lavage, antibiotic irrigation, subluxed the tibia, placed our 5 permanent insert, reduced it, had full extension, full flexion, good stability, varus and valgus stressing 0-30 degrees.  Negative anterior drawer and excellent patellofemoral tracking.  I then reapproximated the quadriceps tendon in the appropriate fashion in minimal flexion and then oversewed it with #1 Vicryl interrupted figure-of-eight sutures, then oversewed with Stratafix.  Then, again tested.  We had excellent patellofemoral tracking, full flexion, full extension, good stability, varus and valgus stressing 0-30 degrees, negative anterior drawer, flexion to gravity at 90  degrees.  Copiously irrigated with antibiotic irrigation.  Subcu with 2-0 and skin with Monocryl.  Sterile dressing applied.  Placed a knee immobilizer, returned to the recovery room in satisfactory condition.  The patient tolerated the procedure well.  No complications.  Assistant, Cleophas Dunker, was used throughout the case for patient positioning, gentle intermittent traction and closure.  Tourniquet time 65 minutes.     Susa Day, M.D.     Geralynn Rile  D:  04/13/2017  T:  04/13/2017  Job:  [id number]

## 2017-04-14 NOTE — Discharge Summary (Signed)
Physician Discharge Summary   Patient ID: Linda Watson MRN: [id number] DOB/AGE: 79-Dec-1937 79 y.o.  Admit date: 04/13/2017 Discharge date:  04/16/17  Primary Diagnosis: Right knee primary osteoarthritis  Admission Diagnoses:  Past Medical History:  Diagnosis Date  . Arthritis    RIGHT KNEE PAIN AND ARTHRITIS  . Bee sting    left anterior hand, states she was stung yesterday 03-16-17, only c/o mild pain, apparent redness and edema observed; denies any severe allergy to been venom (ie no tongue swelling or throat closing); RN encouraged pt to see her primary to r/o worsening or s/s of infection ; patient agreeable to this   . Fracture, patella    01/21/12 - immobilizer x one month   . GERD (gastroesophageal reflux disease)   . Hemorrhoids   . History of shingles   . Lichen planus    LESIONS  IN MOUTH AND OCCASIONALLY GENITAL AREA  . PONV (postoperative nausea and vomiting)   . Spinal stenosis    PAIN IN LOWER BACK AND DOWN LEFT LEG -ALSO HAS NUMBNESS AND TINGLING IN LEFT LEG TO FOOT   Discharge Diagnoses:   Principal Problem:   Primary osteoarthritis of right knee Active Problems:   Right knee DJD  Estimated body mass index is 24.45 kg/m as calculated from the following:   Height as of this encounter: '5\' 3"'  (1.6 m).   Weight as of this encounter: 62.6 kg (138 lb).  Procedure:  Procedure(s) (LRB): RIGHT TOTAL KNEE ARTHROPLASTY (Right)   Consults: None  HPI: see H&P Laboratory Data: Admission on 04/13/2017  Component Date Value Ref Range Status  . WBC 04/14/2017 14.2* 4.0 - 10.5 K/uL Final  . RBC 04/14/2017 3.65* 3.87 - 5.11 MIL/uL Final  . Hemoglobin 04/14/2017 10.9* 12.0 - 15.0 g/dL Final  . HCT 04/14/2017 32.7* 36.0 - 46.0 % Final  . MCV 04/14/2017 89.6  78.0 - 100.0 fL Final  . MCH 04/14/2017 29.9  26.0 - 34.0 pg Final  . MCHC 04/14/2017 33.3  30.0 - 36.0 g/dL Final  . RDW 04/14/2017 14.5  11.5 - 15.5 % Final  . Platelets 04/14/2017 220  150 - 400 K/uL Final  .  Sodium 04/14/2017 136  135 - 145 mmol/L Final  . Potassium 04/14/2017 4.1  3.5 - 5.1 mmol/L Final  . Chloride 04/14/2017 101  101 - 111 mmol/L Final  . CO2 04/14/2017 28  22 - 32 mmol/L Final  . Glucose, Bld 04/14/2017 121* 65 - 99 mg/dL Final  . BUN 04/14/2017 12  6 - 20 mg/dL Final  . Creatinine, Ser 04/14/2017 0.86  0.44 - 1.00 mg/dL Final  . Calcium 04/14/2017 8.6* 8.9 - 10.3 mg/dL Final  . GFR calc non Af Amer 04/14/2017 >60  >60 mL/min Final  . GFR calc Af Amer 04/14/2017 >60  >60 mL/min Final   Comment: (NOTE) The eGFR has been calculated using the CKD EPI equation. This calculation has not been validated in all clinical situations. eGFR's persistently <60 mL/min signify possible Chronic Kidney Disease.   Georgiann Hahn gap 04/14/2017 7  5 - 15 Final  Hospital Outpatient Visit on 03/17/2017  Component Date Value Ref Range Status  . aPTT 03/17/2017 32  24 - 36 seconds Final  . Sodium 03/17/2017 137  135 - 145 mmol/L Final  . Potassium 03/17/2017 4.5  3.5 - 5.1 mmol/L Final  . Chloride 03/17/2017 103  101 - 111 mmol/L Final  . CO2 03/17/2017 23  22 - 32 mmol/L Final  .  Glucose, Bld 03/17/2017 85  65 - 99 mg/dL Final  . BUN 03/17/2017 20  6 - 20 mg/dL Final  . Creatinine, Ser 03/17/2017 0.79  0.44 - 1.00 mg/dL Final  . Calcium 03/17/2017 9.1  8.9 - 10.3 mg/dL Final  . GFR calc non Af Amer 03/17/2017 >60  >60 mL/min Final  . GFR calc Af Amer 03/17/2017 >60  >60 mL/min Final   Comment: (NOTE) The eGFR has been calculated using the CKD EPI equation. This calculation has not been validated in all clinical situations. eGFR's persistently <60 mL/min signify possible Chronic Kidney Disease.   . Anion gap 03/17/2017 11  5 - 15 Final  . WBC 03/17/2017 6.9  4.0 - 10.5 K/uL Final  . RBC 03/17/2017 4.45  3.87 - 5.11 MIL/uL Final  . Hemoglobin 03/17/2017 13.0  12.0 - 15.0 g/dL Final  . HCT 03/17/2017 38.9  36.0 - 46.0 % Final  . MCV 03/17/2017 87.4  78.0 - 100.0 fL Final  . MCH 03/17/2017  29.2  26.0 - 34.0 pg Final  . MCHC 03/17/2017 33.4  30.0 - 36.0 g/dL Final  . RDW 03/17/2017 14.2  11.5 - 15.5 % Final  . Platelets 03/17/2017 251  150 - 400 K/uL Final  . Prothrombin Time 03/17/2017 13.1  11.4 - 15.2 seconds Final  . INR 03/17/2017 0.99   Final  . ABO/RH(D) 03/17/2017 O POS   Final  . Antibody Screen 03/17/2017 NEG   Final  . Sample Expiration 03/17/2017 03/26/2017   Final  . Extend sample reason 03/17/2017 NO TRANSFUSIONS OR PREGNANCY IN THE PAST 3 MONTHS   Final  . Color, Urine 03/17/2017 STRAW* YELLOW Final  . APPearance 03/17/2017 CLEAR  CLEAR Final  . Specific Gravity, Urine 03/17/2017 1.006  1.005 - 1.030 Final  . pH 03/17/2017 7.0  5.0 - 8.0 Final  . Glucose, UA 03/17/2017 NEGATIVE  NEGATIVE mg/dL Final  . Hgb urine dipstick 03/17/2017 NEGATIVE  NEGATIVE Final  . Bilirubin Urine 03/17/2017 NEGATIVE  NEGATIVE Final  . Ketones, ur 03/17/2017 NEGATIVE  NEGATIVE mg/dL Final  . Protein, ur 03/17/2017 NEGATIVE  NEGATIVE mg/dL Final  . Nitrite 03/17/2017 NEGATIVE  NEGATIVE Final  . Leukocytes, UA 03/17/2017 NEGATIVE  NEGATIVE Final  . MRSA, PCR 03/17/2017 NEGATIVE  NEGATIVE Final  . Staphylococcus aureus 03/17/2017 NEGATIVE  NEGATIVE Final   Comment:        The Xpert SA Assay (FDA approved for NASAL specimens in patients over 80 years of age), is one component of a comprehensive surveillance program.  Test performance has been validated by [facility] for patients greater than or equal to 73 year old. It is not intended to diagnose infection nor to guide or monitor treatment.      X-Rays:Dg Knee Right Port  Result Date: 04/13/2017 CLINICAL DATA:  Total knee replacement. EXAM: PORTABLE RIGHT KNEE - 1-2 VIEW COMPARISON:  CT 01/20/2012 . FINDINGS: Total right knee replacement. Surgical staples. Soft tissue air consistent prior surgery. Hardware intact. Anatomic alignment. Diffuse osteopenia again noted. Soft tissue calcification again noted. IMPRESSION: Total  right knee replacement with anatomic alignment. No acute abnormality. Electronically Signed   By: Marcello Moores  Register   On: 04/13/2017 13:14    EKG:No orders found for this or any previous visit.   Hospital Course: Linda Watson is a 81 y.o. who was admitted to [facility]. They were brought to the operating room on 04/13/2017 and underwent Procedure(s): RIGHT TOTAL KNEE ARTHROPLASTY.  Patient tolerated the procedure  well and was later transferred to the recovery room and then to the orthopaedic floor for postoperative care.  They were given PO and IV analgesics for pain control following their surgery.  They were given 24 hours of postoperative antibiotics of  Anti-infectives    Start     Dose/Rate Route Frequency Ordered Stop   04/13/17 1600  ceFAZolin (ANCEF) IVPB 2g/100 mL premix     2 g 200 mL/hr over 30 Minutes Intravenous Every 6 hours 04/13/17 1420 04/14/17 0505   04/13/17 1420  valACYclovir (VALTREX) tablet 500 mg     500 mg Oral Daily PRN 04/13/17 1420     04/13/17 1113  polymyxin B 500,000 Units, bacitracin 50,000 Units in sodium chloride 0.9 % 500 mL irrigation  Status:  Discontinued       As needed 04/13/17 1113 04/13/17 1245   04/13/17 0830  ceFAZolin (ANCEF) IVPB 2g/100 mL premix     2 g 200 mL/hr over 30 Minutes Intravenous On call to O.R. 04/13/17 0818 04/13/17 1117     and started on DVT prophylaxis in the form of Aspirin, TED hose and SCDs.   PT and OT were ordered for total joint protocol.  Discharge planning consulted to help with postop disposition and equipment needs.  Patient had a fair night on the evening of surgery.  They started to get up OOB with therapy on day one. Continued to work with therapy into day two. By day three, the patient had progressed with therapy and meeting their goals.  Incision was healing well.  Patient was seen in rounds and was ready to go to SNF.   Diet: Regular diet Activity:WBAT Follow-up:in 10-14 days Disposition -  Home Discharged Condition: good    Allergies as of 04/14/2017      Reactions   Codeine Nausea Only, Other (See Comments)   Makes pt crazy       Medication List    STOP taking these medications   polyethylene glycol powder powder Commonly known as:  GLYCOLAX/MIRALAX Replaced by:  polyethylene glycol packet     TAKE these medications   acetaminophen 650 MG CR tablet Commonly known as:  TYLENOL Take 650 mg by mouth 2 (two) times daily as needed (for pain.). Scheduled at bedtime   aspirin EC 325 MG tablet Take 1 tablet (325 mg total) by mouth 2 (two) times daily after a meal.   Biotin 5000 MCG Tabs Take 5,000 mcg by mouth daily.   cetirizine 10 MG tablet Commonly known as:  ZYRTEC Take 10 mg by mouth daily.   docusate sodium 100 MG capsule Commonly known as:  COLACE Take 1 capsule (100 mg total) by mouth 2 (two) times daily as needed for mild constipation.   GLUCOSAMINE PO Take 1,500 mg by mouth daily.   HEALTHY COLON PO Take 1 capsule by mouth daily.   Magnesium 500 MG Tabs Take 500 mg by mouth at bedtime.   meloxicam 15 MG tablet Commonly known as:  MOBIC Take 15 mg by mouth daily.   methotrexate 2.5 MG tablet Commonly known as:  RHEUMATREX Take 5 mg by mouth See admin instructions. Once a week as needed for lichen planus   neomycin-polymyxin-hydrocortisone 3.5-10000-1 OTIC suspension Commonly known as:  CORTISPORIN Place 4 drops into the left ear 3 (three) times daily.   oxyCODONE-acetaminophen 5-325 MG tablet Commonly known as:  PERCOCET Take 1-2 tablets by mouth every 4 (four) hours as needed for severe pain.   pantoprazole 40 MG tablet  Commonly known as:  PROTONIX Take 40 mg by mouth daily with breakfast.   polyethylene glycol packet Commonly known as:  MIRALAX / GLYCOLAX Take 17 g by mouth daily. Replaces:  polyethylene glycol powder powder   SYSTANE ULTRA OP Place 1 drop into both eyes 2 (two) times daily as needed (dry eyes).    valACYclovir 500 MG tablet Commonly known as:  VALTREX Take 500 mg by mouth daily as needed (for infection on bottom).   vitamin B-12 1000 MCG tablet Commonly known as:  CYANOCOBALAMIN Take 1,000 mcg by mouth daily.      Follow-up Information    Susa Day, MD Follow up in 2 week(s).   Specialty:  Orthopedic Surgery Contact information: [address] [place] [address] [phone]           Signed: Lacie Draft, PA-C Orthopaedic Surgery 04/14/2017, 2:20 PM

## 2017-04-14 NOTE — Evaluation (Signed)
Occupational Therapy Evaluation Patient Details [name] MRN: [id number] DOB: Aug 16, 1936 Today's Date: 04/14/2017    History of Present Illness Pt s/p R TKR and with hx of back surgery   Clinical Impression   This 81 year old female was admitted for the above. She was independent with adls prior to admission. She currently needs min A for transfers and mod to max A for LB adls. She will benefit from continued OT in acute setting. Goals in acute are for supervision to min guard level.    Follow Up Recommendations  SNF;Supervision/Assistance - 24 hour (vs)    Equipment Recommendations  3 in 1 bedside commode    Recommendations for Other Services       Precautions / Restrictions Precautions Precautions: Knee Required Braces or Orthoses: Knee Immobilizer - Left Knee Immobilizer - Left: Discontinue once straight leg raise with < 10 degree lag Restrictions Other Position/Activity Restrictions: WBAT      Mobility Bed Mobility         Supine to sit: Min assist     General bed mobility comments: min A for RLE and cues for sequence  Transfers   Equipment used: Rolling walker (2 wheeled)   Sit to Stand: Min assist         General transfer comment: cues for UE/LE placement    Balance                                           ADL either performed or assessed with clinical judgement   ADL Overall ADL's : Needs assistance/impaired Eating/Feeding: Independent   Grooming: Wash/dry hands;Sitting;Set up   Upper Body Bathing: Set up;Sitting   Lower Body Bathing: Moderate assistance;Sit to/from stand   Upper Body Dressing : Set up;Sitting   Lower Body Dressing: Maximal assistance;Sit to/from stand   Toilet Transfer: Minimal assistance;Ambulation;BSC;RW   Toileting- Clothing Manipulation and Hygiene: Minimal assistance;Sit to/from stand         General ADL Comments: ambulated to bathroom, used toilet and donned underwear.  Pt felt  nauseous.  returned to bed; BP 121/60 and RN came in with medication.  Educated on concept of AE but did not use this visit.     Vision         Perception     Praxis      Pertinent Vitals/Pain Pain Score: 4  Pain Location: R knee Pain Descriptors / Indicators: Sore Pain Intervention(s): Limited activity within patient's tolerance;Monitored during session;Premedicated before session;Repositioned;Ice applied     Hand Dominance     Extremity/Trunk Assessment Upper Extremity Assessment Upper Extremity Assessment: Overall WFL for tasks assessed           Communication Communication Communication: No difficulties   Cognition Arousal/Alertness: Awake/alert Behavior During Therapy: WFL for tasks assessed/performed Overall Cognitive Status: Within Functional Limits for tasks assessed                                     General Comments       Exercises     Shoulder Instructions      Home Living Family/patient expects to be discharged to:: Private residence Living Arrangements: Alone                 Bathroom Shower/Tub: Occupational psychologist:  Standard     Home Equipment: None   Additional Comments: Pt states son can stay with her overnight as needed      Prior Functioning/Environment Level of Independence: Independent                 OT Problem List: Decreased strength;Decreased activity tolerance;Pain;Decreased knowledge of precautions;Decreased knowledge of use of DME or AE      OT Treatment/Interventions: Self-care/ADL training;DME and/or AE instruction;Patient/family education    OT Goals(Current goals can be found in the care plan section) Acute Rehab OT Goals Patient Stated Goal: Regain IND ASAP OT Goal Formulation: With patient Time For Goal Achievement: 04/21/17 Potential to Achieve Goals: Good ADL Goals Pt Will Perform Lower Body Bathing: with supervision;with adaptive equipment;sit to/from stand Pt Will  Perform Lower Body Dressing: with supervision;sit to/from stand;with adaptive equipment (pants and underwear with reacher) Pt Will Transfer to Toilet: with supervision;ambulating;bedside commode Pt Will Perform Toileting - Clothing Manipulation and hygiene: with supervision;sit to/from stand Pt Will Perform Tub/Shower Transfer: Shower transfer;with min guard assist;ambulating;3 in 1  OT Frequency: Min 2X/week   Barriers to D/C:            Co-evaluation              AM-PAC PT "6 Clicks" Daily Activity     Outcome Measure Help from another person eating meals?: None Help from another person taking care of personal grooming?: A Little Help from another person toileting, which includes using toliet, bedpan, or urinal?: A Little Help from another person bathing (including washing, rinsing, drying)?: A Lot Help from another person to put on and taking off regular upper body clothing?: A Little Help from another person to put on and taking off regular lower body clothing?: A Lot 6 Click Score: 17   End of Session    Activity Tolerance: Treatment limited secondary to medical complications (Comment) Patient left: in bed;with call bell/phone within reach;with nursing/sitter in room  OT Visit Diagnosis: Pain Pain - Right/Left: Right Pain - part of body: Knee                Time: 2993-7169 OT Time Calculation (min): 23 min Charges:  OT General Charges $OT Visit: 1 Procedure OT Evaluation $OT Eval Low Complexity: 1 Procedure G-Codes:     Linda Watson [phone] 04/14/2017  Linda Watson 04/14/2017, 10:54 AM

## 2017-04-14 NOTE — Progress Notes (Signed)
Physical Therapy Treatment Patient Details [name] MRN: [id number] DOB: [birth year] Today's Date: 04/14/2017    History of Present Illness Pt s/p R TKR and with hx of back surgery    PT Comments    Pt motivated but ltd this am by increased pain and N&V.   Follow Up Recommendations  Home health PT     Equipment Recommendations  Rolling walker with 5" wheels;3in1 (PT)    Recommendations for Other Services OT consult     Precautions / Restrictions Precautions Precautions: Knee Required Braces or Orthoses: Knee Immobilizer - Left Knee Immobilizer - Left: Discontinue once straight leg raise with < 10 degree lag Restrictions Weight Bearing Restrictions: No Other Position/Activity Restrictions: WBAT    Mobility  Bed Mobility Overal bed mobility: Needs Assistance Bed Mobility: Supine to Sit     Supine to sit: Min assist     General bed mobility comments: min A for RLE and cues for sequence  Transfers Overall transfer level: Needs assistance Equipment used: Rolling walker (2 wheeled) Transfers: Sit to/from Stand Sit to Stand: Min assist         General transfer comment: cues for UE/LE placement  Ambulation/Gait Ambulation/Gait assistance: Min assist Ambulation Distance (Feet): 54 Feet Assistive device: Rolling walker (2 wheeled) Gait Pattern/deviations: Step-to pattern;Decreased step length - right;Decreased step length - left;Shuffle;Trunk flexed Gait velocity: decr Gait velocity interpretation: Below normal speed for age/gender General Gait Details: cues for sequence, posture, position from RW and increased UE WB   Stairs            Wheelchair Mobility    Modified Rankin (Stroke Patients Only)       Balance                                            Cognition Arousal/Alertness: Awake/alert Behavior During Therapy: WFL for tasks assessed/performed Overall Cognitive Status: Within Functional Limits for tasks  assessed                                        Exercises Total Joint Exercises Ankle Circles/Pumps: AROM;Both;15 reps;Supine    General Comments        Pertinent Vitals/Pain Pain Assessment: 0-10 Pain Score: 6  Pain Location: R knee Pain Descriptors / Indicators: Sore Pain Intervention(s): Limited activity within patient's tolerance;Monitored during session;Premedicated before session;Ice applied    Home Living Family/patient expects to be discharged to:: Private residence Living Arrangements: Alone           Home Equipment: None Additional Comments: Pt states son can stay with her overnight as needed    Prior Function Level of Independence: Independent          PT Goals (current goals can now be found in the care plan section) Acute Rehab PT Goals Patient Stated Goal: Regain IND ASAP PT Goal Formulation: With patient Time For Goal Achievement: 04/18/17 Potential to Achieve Goals: Good Progress towards PT goals: Progressing toward goals    Frequency    7X/week      PT Plan Current plan remains appropriate    Co-evaluation              AM-PAC PT "6 Clicks" Daily Activity  Outcome Measure  Difficulty turning over in bed (including adjusting bedclothes, sheets  and blankets)?: Unable Difficulty moving from lying on back to sitting on the side of the bed? : Unable Difficulty sitting down on and standing up from a chair with arms (e.g., wheelchair, bedside commode, etc,.)?: Unable Help needed moving to and from a bed to chair (including a wheelchair)?: A Little Help needed walking in hospital room?: A Little Help needed climbing 3-5 steps with a railing? : A Lot 6 Click Score: 11    End of Session Equipment Utilized During Treatment: Gait belt;Right knee immobilizer Activity Tolerance: Patient limited by fatigue;Other (comment) (nausea) Patient left: in chair;with call bell/phone within reach Nurse Communication: Mobility  status PT Visit Diagnosis: Unsteadiness on feet (R26.81);Difficulty in walking, not elsewhere classified (R26.2)     Time: 9233-0076 PT Time Calculation (min) (ACUTE ONLY): 17 min  Charges:  $Gait Training: 8-22 mins                    G Codes:       Pg [phone]    Aedon Deason 04/14/2017, 12:35 PM

## 2017-04-14 NOTE — Progress Notes (Signed)
Spoke with patient at bedside. States she lives at home alone, was wanting to go to SNF at d/c. PT note indicates [facility] but only walked 88ft with assist. OT note recommends SNF. Will await further progress on PT/OT, patient chose [facility] if decision for HHPT. Will also need RW and 3n1. No services arranged until final determination is made, nurse aware.

## 2017-04-15 LAB — CBC
HCT: 31.7 % — ABNORMAL LOW (ref 36.0–46.0)
Hemoglobin: 10.6 g/dL — ABNORMAL LOW (ref 12.0–15.0)
MCH: 28.9 pg (ref 26.0–34.0)
MCHC: 33.4 g/dL (ref 30.0–36.0)
MCV: 86.4 fL (ref 78.0–100.0)
PLATELETS: 206 10*3/uL (ref 150–400)
RBC: 3.67 MIL/uL — AB (ref 3.87–5.11)
RDW: 14.2 % (ref 11.5–15.5)
WBC: 10.8 10*3/uL — AB (ref 4.0–10.5)

## 2017-04-15 NOTE — Progress Notes (Signed)
Occupational Therapy Treatment Patient Details [name] MRN: [id number] DOB: 1936/04/06 Today's Date: 04/15/2017    History of present illness Pt s/p R TKR and with hx of back surgery   OT comments  Pt much improved this day!   Follow Up Recommendations  Supervision/Assistance - 24 hour;Home health OT;Supervision - Intermittent (vs)    Equipment Recommendations  3 in 1 bedside commode    Recommendations for Other Services      Precautions / Restrictions Precautions Precautions: Knee Required Braces or Orthoses: Knee Immobilizer - Left Knee Immobilizer - Left: Discontinue once straight leg raise with < 10 degree lag Restrictions Weight Bearing Restrictions: No Other Position/Activity Restrictions: WBAT       Mobility Bed Mobility Overal bed mobility: Needs Assistance Bed Mobility: Supine to Sit     Supine to sit: Min guard     General bed mobility comments: min A for RLE and cues for sequence  Transfers Overall transfer level: Needs assistance Equipment used: Rolling walker (2 wheeled) Transfers: Sit to/from Stand Sit to Stand: Min guard         General transfer comment: cues for UE/LE placement        ADL either performed or assessed with clinical judgement   ADL Overall ADL's : Needs assistance/impaired Eating/Feeding: Set up;Sitting   Grooming: Wash/dry hands;Sitting;Set up   Upper Body Bathing: Set up;Sitting   Lower Body Bathing: Minimal assistance;Sit to/from stand;Cueing for safety;Cueing for sequencing   Upper Body Dressing : Set up;Sitting   Lower Body Dressing: Minimal assistance;Sit to/from stand;Cueing for safety;Cueing for sequencing   Toilet Transfer: Minimal assistance;Ambulation;RW;Comfort height toilet   Toileting- Clothing Manipulation and Hygiene: Supervision/safety;Sit to/from stand;Cueing for safety;Cueing for sequencing         General ADL Comments: pt much improved this day.  Church friends and son can  likely A upon DC     Vision Patient Visual Report: No change from baseline            Cognition Arousal/Alertness: Awake/alert Behavior During Therapy: WFL for tasks assessed/performed Overall Cognitive Status: Within Functional Limits for tasks assessed                                                General Comments      Pertinent Vitals/ Pain       Pain Assessment: 0-10 Pain Score: 3  Pain Location: R knee Pain Descriptors / Indicators: Sore Pain Intervention(s): Monitored during session     Prior Functioning/Environment              Frequency  Min 2X/week        Progress Toward Goals  OT Goals(current goals can now be found in the care plan section)  Progress towards OT goals: Progressing toward goals  Acute Rehab OT Goals Patient Stated Goal: Regain IND ASAP  Plan Discharge plan needs to be updated    Co-evaluation                 AM-PAC PT "6 Clicks" Daily Activity     Outcome Measure   Help from another person eating meals?: None Help from another person taking care of personal grooming?: A Little Help from another person toileting, which includes using toliet, bedpan, or urinal?: A Little Help from another person bathing (including washing, rinsing, drying)?: A Little Help from another  person to put on and taking off regular upper body clothing?: A Little Help from another person to put on and taking off regular lower body clothing?: A Little 6 Click Score: 19    End of Session Equipment Utilized During Treatment: Rolling walker  OT Visit Diagnosis: Pain Pain - Right/Left: Right Pain - part of body: Knee   Activity Tolerance Patient tolerated treatment well   Patient Left with call bell/phone within reach;in chair   Nurse Communication Mobility status        Time: 1240-1305 OT Time Calculation (min): 25 min  Charges: OT General Charges $OT Visit: 1 Procedure OT Treatments $Self Care/Home Management :  23-37 mins  [place], [place] [phone]   Payton Mccallum D 04/15/2017, 1:12 PM

## 2017-04-15 NOTE — [place] FL2 (Signed)
[place] MEDICAID FL2 LEVEL OF CARE SCREENING TOOL     IDENTIFICATION  Patient [name] Birthdate: Dec 29, 1935 Sex: female Admission Date (Current Location): 04/13/2017  [facility] and [place] Number:  Herbalist and Address:  [facility] [place] [place] [address], [place], [place] [address]      Provider Number: [id number]  Attending Physician Name and Address:  Susa Day, MD  Relative Name and Phone Number:       Current Level of Care: Hospital Recommended Level of Care: [place] Prior Approval Number:    Date Approved/Denied:   PASRR Number: [id number]  Discharge Plan: SNF    Current Diagnoses: Patient Active Problem List   Diagnosis Date Noted  . Primary osteoarthritis of right knee 04/13/2017  . Right knee DJD 04/13/2017  . Lumbar spinal stenosis 03/21/2012    Orientation RESPIRATION BLADDER Height & Weight     Self, Time, Situation, Place  Normal Incontinent, Indwelling catheter Weight: 138 lb (62.6 kg) Height:  5\' 3"  (160 cm)  BEHAVIORAL SYMPTOMS/MOOD NEUROLOGICAL BOWEL NUTRITION STATUS      Continent Diet (regular)  AMBULATORY STATUS COMMUNICATION OF NEEDS Skin   Limited Assist Verbally Surgical wounds                       Personal Care Assistance Level of Assistance  Bathing, Dressing Bathing Assistance: Limited assistance   Dressing Assistance: Limited assistance     Functional Limitations Info             SPECIAL CARE FACTORS FREQUENCY  PT (By licensed PT), OT (By licensed OT)     PT Frequency: 5/wk OT Frequency: 5/wk            Contractures      Additional Factors Info  Code Status, Allergies Code Status Info: FULL Allergies Info: Codeine           Current Medications (04/15/2017):  This is the current hospital active medication list Current Facility-Administered Medications  Medication Dose Route Frequency Provider Last Rate Last Dose  . acetaminophen  (TYLENOL) tablet 650 mg  650 mg Oral Q6H PRN Susa Day, MD   650 mg at 04/15/17 0440   Or  . acetaminophen (TYLENOL) suppository 650 mg  650 mg Rectal Q6H PRN Susa Day, MD      . acidophilus (RISAQUAD) capsule 1 capsule  1 capsule Oral Daily Susa Day, MD   1 capsule at 04/15/17 0900  . alum & mag hydroxide-simeth (MAALOX/MYLANTA) 200-200-20 MG/5ML suspension 30 mL  30 mL Oral Q4H PRN Susa Day, MD      . aspirin EC tablet 325 mg  325 mg Oral BID PC Susa Day, MD   325 mg at 04/15/17 0900  . bisacodyl (DULCOLAX) EC tablet 5 mg  5 mg Oral Daily PRN Susa Day, MD      . diphenhydrAMINE (BENADRYL) 12.5 MG/5ML elixir 12.5-25 mg  12.5-25 mg Oral Q4H PRN Susa Day, MD      . docusate sodium (COLACE) capsule 100 mg  100 mg Oral BID Susa Day, MD   100 mg at 04/15/17 0859  . HYDROmorphone (DILAUDID) injection 0.5 mg  0.5 mg Intravenous Q2H PRN Susa Day, MD      . loratadine (CLARITIN) tablet 10 mg  10 mg Oral Daily Susa Day, MD   10 mg at 04/15/17 0900  . magnesium citrate solution 1 Bottle  1 Bottle Oral Once PRN Susa Day, MD      .  menthol-cetylpyridinium (CEPACOL) lozenge 3 mg  1 lozenge Oral PRN Susa Day, MD       Or  . phenol (CHLORASEPTIC) mouth spray 1 spray  1 spray Mouth/Throat PRN Susa Day, MD      . methocarbamol (ROBAXIN) tablet 500 mg  500 mg Oral Q6H PRN Susa Day, MD       Or  . methocarbamol (ROBAXIN) 500 mg in dextrose 5 % 50 mL IVPB  500 mg Intravenous Q6H PRN Susa Day, MD 110 mL/hr at 04/13/17 1315 500 mg at 04/13/17 1315  . metoCLOPramide (REGLAN) tablet 5-10 mg  5-10 mg Oral Q8H PRN Susa Day, MD       Or  . metoCLOPramide (REGLAN) injection 5-10 mg  5-10 mg Intravenous Q8H PRN Susa Day, MD   10 mg at 04/15/17 0102  . neomycin-polymyxin-hydrocortisone (CORTISPORIN) OTIC (EAR) suspension 4 drop  4 drop Left EAR TID Susa Day, MD      . ondansetron [facility]) tablet 4 mg  4 mg Oral Q6H PRN  Susa Day, MD   4 mg at 04/13/17 1805   Or  . ondansetron (ZOFRAN) injection 4 mg  4 mg Intravenous Q6H PRN Susa Day, MD   4 mg at 04/14/17 0921  . oxyCODONE (Oxy IR/ROXICODONE) immediate release tablet 5-10 mg  5-10 mg Oral Q3H PRN Susa Day, MD   10 mg at 04/15/17 0137  . pantoprazole (PROTONIX) EC tablet 40 mg  40 mg Oral Q breakfast Susa Day, MD   40 mg at 04/15/17 0900  . polyethylene glycol (MIRALAX / GLYCOLAX) packet 17 g  17 g Oral Daily PRN Susa Day, MD      . polyvinyl alcohol (LIQUIFILM TEARS) 1.4 % ophthalmic solution 1 drop  1 drop Both Eyes BID PRN Susa Day, MD   1 drop at 04/13/17 1805  . valACYclovir (VALTREX) tablet 500 mg  500 mg Oral Daily PRN Susa Day, MD      . vitamin B-12 (CYANOCOBALAMIN) tablet 1,000 mcg  1,000 mcg Oral Daily Susa Day, MD   1,000 mcg at 04/15/17 4193     Discharge Medications: Please see discharge summary for a list of discharge medications.  Relevant Imaging Results:  Relevant Lab Results:   Additional Information SS#: [id number]  Jorge Ny, LCSW

## 2017-04-15 NOTE — Progress Notes (Signed)
Physical Therapy Treatment Patient Details [name] MRN: [id number] DOB: [birth year] Today's Date: 04/15/2017    History of Present Illness Pt s/p R TKR and with hx of back surgery    PT Comments    Pt with marked improvement in activity tolerance and reporting improved pain control and with no c/o nausea.   Follow Up Recommendations  Home health PT;SNF     Equipment Recommendations  Rolling walker with 5" wheels;3in1 (PT)    Recommendations for Other Services OT consult     Precautions / Restrictions Precautions Precautions: Knee Required Braces or Orthoses: Knee Immobilizer - Left Knee Immobilizer - Left: Discontinue once straight leg raise with < 10 degree lag Restrictions Weight Bearing Restrictions: No Other Position/Activity Restrictions: WBAT    Mobility  Bed Mobility Overal bed mobility: Needs Assistance Bed Mobility: Supine to Sit     Supine to sit: Min assist     General bed mobility comments: min A for RLE and cues for sequence  Transfers Overall transfer level: Needs assistance Equipment used: Rolling walker (2 wheeled) Transfers: Sit to/from Stand Sit to Stand: Min assist         General transfer comment: cues for UE/LE placement  Ambulation/Gait Ambulation/Gait assistance: Min assist Ambulation Distance (Feet): 100 Feet Assistive device: Rolling walker (2 wheeled) Gait Pattern/deviations: Step-to pattern;Decreased step length - right;Decreased step length - left;Shuffle;Trunk flexed Gait velocity: decr   General Gait Details: cues for sequence, posture, position from RW and increased UE WB   Stairs            Wheelchair Mobility    Modified Rankin (Stroke Patients Only)       Balance                                            Cognition Arousal/Alertness: Awake/alert Behavior During Therapy: WFL for tasks assessed/performed Overall Cognitive Status: Within Functional Limits for tasks  assessed                                        Exercises Total Joint Exercises Ankle Circles/Pumps: AROM;Both;15 reps;Supine Quad Sets: AROM;Both;10 reps;Supine Heel Slides: AAROM;Right;Supine;15 reps Straight Leg Raises: AAROM;Right;Supine;15 reps Goniometric ROM: AAROM at R knee -10- 45    General Comments        Pertinent Vitals/Pain Pain Assessment: 0-10 Pain Score: 4  Pain Location: R knee Pain Descriptors / Indicators: Sore Pain Intervention(s): Limited activity within patient's tolerance;Monitored during session;Premedicated before session;Ice applied    Home Living                      Prior Function            PT Goals (current goals can now be found in the care plan section) Acute Rehab PT Goals Patient Stated Goal: Regain IND ASAP PT Goal Formulation: With patient Time For Goal Achievement: 04/18/17 Potential to Achieve Goals: Good Progress towards PT goals: Progressing toward goals    Frequency    7X/week      PT Plan Current plan remains appropriate    Co-evaluation              AM-PAC PT "6 Clicks" Daily Activity  Outcome Measure  Difficulty turning over in bed (including adjusting bedclothes,  sheets and blankets)?: Unable Difficulty moving from lying on back to sitting on the side of the bed? : Unable Difficulty sitting down on and standing up from a chair with arms (e.g., wheelchair, bedside commode, etc,.)?: Unable Help needed moving to and from a bed to chair (including a wheelchair)?: A Little Help needed walking in hospital room?: A Little Help needed climbing 3-5 steps with a railing? : A Lot 6 Click Score: 11    End of Session Equipment Utilized During Treatment: Gait belt;Right knee immobilizer Activity Tolerance: Patient tolerated treatment well Patient left: in chair;with call bell/phone within reach Nurse Communication: Mobility status PT Visit Diagnosis: Unsteadiness on feet (R26.81);Difficulty  in walking, not elsewhere classified (R26.2)     Time: 8916-9450 PT Time Calculation (min) (ACUTE ONLY): 24 min  Charges:  $Gait Training: 8-22 mins $Therapeutic Exercise: 8-22 mins                    G Codes:       Pg [phone]    Monserrath Junio 04/15/2017, 11:54 AM

## 2017-04-15 NOTE — Progress Notes (Signed)
Pt has decided to go home with home services- CSW left message for wknd RNCM to inform of choice to go home  CSW signing off  Jorge Ny, [facility] Social Worker [phone]

## 2017-04-15 NOTE — Progress Notes (Addendum)
Physical Therapy Treatment Patient Details [name] MRN: [id number] DOB: 1936-08-26 Today's Date: 04/15/2017   History of Present Illness Pt s/p R TKR and with hx of back surgery    PT Comments    Pt progressing well with mobility and gaining confidence regarding dc home vs SNF.  Follow Up Recommendations  Home health PT;SNF     Equipment Recommendations  3in1 (PT)    Recommendations for Other Services OT consult     Precautions / Restrictions Precautions Precautions: Knee Required Braces or Orthoses: Knee Immobilizer - Left Knee Immobilizer - Left: Discontinue once straight leg raise with < 10 degree lag Restrictions Weight Bearing Restrictions: No Other Position/Activity Restrictions: WBAT    Mobility  Bed Mobility Overal bed mobility: Needs Assistance Bed Mobility: Sit to Supine     Supine to sit: Min guard Sit to supine: Min guard   General bed mobility comments: min A for RLE and cues for sequence  Transfers Overall transfer level: Needs assistance Equipment used: Rolling walker (2 wheeled) Transfers: Sit to/from Stand Sit to Stand: Supervision         General transfer comment: cues for UE/LE placement  Ambulation/Gait Ambulation/Gait assistance: Min guard Ambulation Distance (Feet): 140 Feet Assistive device: Rolling walker (2 wheeled) Gait Pattern/deviations: Decreased step length - right;Decreased step length - left;Shuffle;Trunk flexed;Step-to pattern;Step-through pattern Gait velocity: decr Gait velocity interpretation: Below normal speed for age/gender General Gait Details: cues for sequence, posture, position from [employer]            Wheelchair Mobility    Modified Rankin (Stroke Patients Only)       Balance                                            Cognition Arousal/Alertness: Awake/alert Behavior During Therapy: WFL for tasks assessed/performed Overall Cognitive Status: Within  Functional Limits for tasks assessed                                        Exercises Total Joint Exercises Ankle Circles/Pumps: AROM;Both;15 reps;Supine    General Comments        Pertinent Vitals/Pain Pain Assessment: 0-10 Pain Score: 3  Pain Location: R knee Pain Descriptors / Indicators: Sore Pain Intervention(s): Limited activity within patient's tolerance;Monitored during session;Premedicated before session;Ice applied    Home Living                      Prior Function            PT Goals (current goals can now be found in the care plan section) Acute Rehab PT Goals Patient Stated Goal: Regain IND ASAP PT Goal Formulation: With patient Time For Goal Achievement: 04/18/17 Potential to Achieve Goals: Good Progress towards PT goals: Progressing toward goals    Frequency    7X/week      PT Plan Current plan remains appropriate    Co-evaluation              AM-PAC PT "6 Clicks" Daily Activity  Outcome Measure  Difficulty turning over in bed (including adjusting bedclothes, sheets and blankets)?: A Lot Difficulty moving from lying on back to sitting on the side of the bed? : A Lot Difficulty sitting down  on and standing up from a chair with arms (e.g., wheelchair, bedside commode, etc,.)?: A Little Help needed moving to and from a bed to chair (including a wheelchair)?: A Little Help needed walking in hospital room?: A Little Help needed climbing 3-5 steps with a railing? : A Little 6 Click Score: 16    End of Session Equipment Utilized During Treatment: Gait belt Activity Tolerance: Patient tolerated treatment well Patient left: in bed;with call bell/phone within reach;with family/visitor present Nurse Communication: Mobility status PT Visit Diagnosis: Unsteadiness on feet (R26.81);Difficulty in walking, not elsewhere classified (R26.2)     Time: 3953-2023 PT Time Calculation (min) (ACUTE ONLY): 25 min  Charges:   $Gait Training: 23-37 mins                    G Codes:       Pg [phone]    Kadan Millstein 04/15/2017, 3:37 PM

## 2017-04-15 NOTE — Progress Notes (Signed)
Subjective: 2 Days Post-Op Procedure(s) (LRB): RIGHT TOTAL KNEE ARTHROPLASTY (Right) Patient reports pain as 3 on 0-10 scale.   She reports that her pain is well-controlled. She is sitting up in bed comfortably.  She is tolerating her diet with no difficulty. She reports passing flatus. She is doing well with therapy.  She denies chest pain, shortness of breath, calf pain, fever, chills, nausea, vomiting, or diarrhea.   Objective: Vital signs in last 24 hours: Temp:  [98.6 F (37 C)-100.1 F (37.8 C)] 99.1 F (37.3 C) (08/18 0630) Pulse Rate:  [75-88] 86 (08/18 0431) Resp:  [17-18] 17 (08/18 0431) BP: (115-131)/(49-62) 117/62 (08/18 0431) SpO2:  [92 %-100 %] 92 % (08/18 0431)  Intake/Output from previous day: 08/17 0701 - 08/18 0700 In: 305 [P.O.:305] Out: 1150 [Urine:1150] Intake/Output this shift: No intake/output data recorded.   Recent Labs  04/14/17 0558 04/15/17 0455  HGB 10.9* 10.6*    Recent Labs  04/14/17 0558 04/15/17 0455  WBC 14.2* 10.8*  RBC 3.65* 3.67*  HCT 32.7* 31.7*  PLT 220 206    Recent Labs  04/14/17 0558  NA 136  K 4.1  CL 101  CO2 28  BUN 12  CREATININE 0.86  GLUCOSE 121*  CALCIUM 8.6*   No results for input(s): LABPT, INR in the last 72 hours.  Alert and oriented x 3.  Dressing is clean, dry, and intact. Minimal dried bloody drainage on the Aquacel dressing. Dorsiflexion/Plantar flexion intact  Grossly neurovascularly intact.  Sensation intact to light touch.  Pulses 2+ bilaterally.  Assessment/Plan: 2 Days Post-Op Procedure(s) (LRB): RIGHT TOTAL KNEE ARTHROPLASTY (Right) Up with therapy. WBAT to Right Leg DVT Prophylaxis - ASA 325mg  BID per protocol Continue with pain management as needed Plan on D/C Sunday to either home with HHPT versus short term rehab stay  Brynda Peon 04/15/2017, 9:08 AM

## 2017-04-15 NOTE — Progress Notes (Signed)
Spoke to patient who confirmed she was now going home stating she would have "some help" .  Verified address and phone numbers.  Offered choice of [place] agencies she chose [facility], for HHPT.  Contacted Butch [facility] rep and set up services .  Spoke to [employer] rep requesting delivery of 3 in 1, no co pay for patient after informing her of this she wanted DME delivered.  No other CM needs at this time.    Rayburn Ma RN, BSN, CM

## 2017-04-16 LAB — CBC
HCT: 29.2 % — ABNORMAL LOW (ref 36.0–46.0)
Hemoglobin: 9.9 g/dL — ABNORMAL LOW (ref 12.0–15.0)
MCH: 29.9 pg (ref 26.0–34.0)
MCHC: 33.9 g/dL (ref 30.0–36.0)
MCV: 88.2 fL (ref 78.0–100.0)
Platelets: 184 K/uL (ref 150–400)
RBC: 3.31 MIL/uL — ABNORMAL LOW (ref 3.87–5.11)
RDW: 14.3 % (ref 11.5–15.5)
WBC: 10.1 K/uL (ref 4.0–10.5)

## 2017-04-16 NOTE — Progress Notes (Signed)
Occupational Therapy Treatment Patient Details [name] MRN: [id number] DOB: October 26, 1935 Today's Date: 04/16/2017    History of present illness Pt s/p R TKR and with hx of back surgery   OT comments  Pt progressing towards acute OT goals. Session details below. Plans d/c today.  Follow Up Recommendations  Supervision/Assistance - 24 hour;Home health OT;Supervision - Intermittent    Equipment Recommendations  3 in 1 bedside commode    Recommendations for Other Services      Precautions / Restrictions Precautions Precautions: Knee Required Braces or Orthoses: Knee Immobilizer - Left Knee Immobilizer - Left: Discontinue once straight leg raise with < 10 degree lag Restrictions Weight Bearing Restrictions: No Other Position/Activity Restrictions: WBAT       Mobility Bed Mobility Overal bed mobility: Needs Assistance Bed Mobility: Supine to Sit;Sit to Supine     Supine to sit: Min guard Sit to supine: Supervision   General bed mobility comments: self assisting the right  leg  Transfers Overall transfer level: Needs assistance Equipment used: Rolling walker (2 wheeled) Transfers: Sit to/from Stand Sit to Stand: Supervision         General transfer comment: cues for UE/LE placement    Balance                                           ADL either performed or assessed with clinical judgement   ADL Overall ADL's : Needs assistance/impaired                         Toilet Transfer: Min guard;Ambulation;RW (3n1 over toilet)           Functional mobility during ADLs: Min guard;Rolling walker General ADL Comments: Pt had just finished washing up and getting dressed, agreeable to work with therapy. Pt completed in-room functional mobility and toilet trasnfer as well as bed mobility. Reviewed ADL education and safety with home setup.      Vision       Perception     Praxis      Cognition Arousal/Alertness:  Awake/alert Behavior During Therapy: WFL for tasks assessed/performed Overall Cognitive Status: Within Functional Limits for tasks assessed                                          Exercises Total Joint Exercises Ankle Circles/Pumps: AROM;Both;15 reps;Supine Quad Sets: AROM;Both;10 reps;Supine Towel Squeeze: AROM;Both;10 reps;Supine Heel Slides: AAROM;Right;10 reps;Supine Hip ABduction/ADduction: AAROM;Right;Supine Straight Leg Raises: AAROM;Right;10 reps;Supine   Shoulder Instructions       General Comments lost balancw=e x 1 when throwing paper into basket,    Pertinent Vitals/ Pain       Pain Assessment: 0-10 Pain Score: 3  Pain Location: R knee Pain Descriptors / Indicators: Sore Pain Intervention(s): Limited activity within patient's tolerance;Monitored during session;Repositioned  Home Living                                          Prior Functioning/Environment              Frequency  Min 2X/week        Progress Toward Goals  OT Goals(current goals  can now be found in the care plan section)  Progress towards OT goals: Progressing toward goals  Acute Rehab OT Goals Patient Stated Goal: Regain IND ASAP OT Goal Formulation: With patient Time For Goal Achievement: 04/21/17 Potential to Achieve Goals: Good ADL Goals Pt Will Perform Lower Body Bathing: with supervision;with adaptive equipment;sit to/from stand Pt Will Perform Lower Body Dressing: with supervision;sit to/from stand;with adaptive equipment Pt Will Transfer to Toilet: with supervision;ambulating;bedside commode Pt Will Perform Toileting - Clothing Manipulation and hygiene: with supervision;sit to/from stand Pt Will Perform Tub/Shower Transfer: Shower transfer;with min guard assist;ambulating;3 in 1  Plan Discharge plan remains appropriate    Co-evaluation                 AM-PAC PT "6 Clicks" Daily Activity     Outcome Measure   Help from  another person eating meals?: None Help from another person taking care of personal grooming?: None Help from another person toileting, which includes using toliet, bedpan, or urinal?: A Little Help from another person bathing (including washing, rinsing, drying)?: A Little Help from another person to put on and taking off regular upper body clothing?: None Help from another person to put on and taking off regular lower body clothing?: A Little 6 Click Score: 21    End of Session Equipment Utilized During Treatment: Rolling walker  OT Visit Diagnosis: Pain Pain - Right/Left: Right Pain - part of body: Knee   Activity Tolerance Patient tolerated treatment well   Patient Left with call bell/phone within reach;in chair   Nurse Communication          Time: 5830-9407 OT Time Calculation (min): 14 min  Charges: OT General Charges $OT Visit: 1 Procedure OT Treatments $Self Care/Home Management : 8-22 mins     Hortencia Pilar 04/16/2017, 2:46 PM

## 2017-04-16 NOTE — Progress Notes (Signed)
Physical Therapy Treatment Patient Details [name] MRN: [id number] DOB: [birth year] Today's Date: 04/16/2017    History of Present Illness Pt s/p R TKR and with hx of back surgery    PT Comments    The patient is progressing well. Plans DC today.  Follow Up Recommendations  Home health PT     Equipment Recommendations  3in1 (PT)    Recommendations for Other Services OT consult     Precautions / Restrictions Precautions Precautions: Knee Required Braces or Orthoses: Knee Immobilizer - Left Knee Immobilizer - Left: Discontinue once straight leg raise with < 10 degree lag    Mobility  Bed Mobility   Bed Mobility: Supine to Sit;Sit to Supine       Sit to supine: Supervision   General bed mobility comments: self assisting the right  leg  Transfers Overall transfer level: Needs assistance Equipment used: Rolling walker (2 wheeled) Transfers: Sit to/from Stand Sit to Stand: Supervision         General transfer comment: cues for UE/LE placement  Ambulation/Gait Ambulation/Gait assistance: Min guard;Min assist Ambulation Distance (Feet): 50 Feet Assistive device: Rolling walker (2 wheeled) Gait Pattern/deviations: Step-to pattern;Step-through pattern Gait velocity: decr   General Gait Details: cues for sequence, posture, position from RW    Stairs Stairs: Yes   Stair Management: Forwards;With cane;One rail Right;Step to pattern Number of Stairs: 4 General stair comments: cues for sequence  Wheelchair Mobility    Modified Rankin (Stroke Patients Only)       Balance                                            Cognition Arousal/Alertness: Awake/alert                                            Exercises Total Joint Exercises Ankle Circles/Pumps: AROM;Both;15 reps;Supine Quad Sets: AROM;Both;10 reps;Supine Towel Squeeze: AROM;Both;10 reps;Supine Heel Slides: AAROM;Right;10 reps;Supine Hip  ABduction/ADduction: AAROM;Right;Supine Straight Leg Raises: AAROM;Right;10 reps;Supine    General Comments General comments (skin integrity, edema, etc.): lost balancw=e x 1 when throwing paper into basket,      Pertinent Vitals/Pain Pain Score: 3  Pain Location: R knee Pain Descriptors / Indicators: Sore Pain Intervention(s): Monitored during session;Premedicated before session;Repositioned;Ice applied    Home Living                      Prior Function            PT Goals (current goals can now be found in the care plan section) Progress towards PT goals: Progressing toward goals    Frequency    7X/week      PT Plan Current plan remains appropriate    Co-evaluation              AM-PAC PT "6 Clicks" Daily Activity  Outcome Measure  Difficulty turning over in bed (including adjusting bedclothes, sheets and blankets)?: A Lot Difficulty moving from lying on back to sitting on the side of the bed? : A Lot Difficulty sitting down on and standing up from a chair with arms (e.g., wheelchair, bedside commode, etc,.)?: A Little Help needed moving to and from a bed to chair (including a wheelchair)?: A Little Help needed  walking in hospital room?: A Little Help needed climbing 3-5 steps with a railing? : A Little 6 Click Score: 16    End of Session   Activity Tolerance: Patient tolerated treatment well Patient left: in bed;with call bell/phone within reach Nurse Communication: Mobility status PT Visit Diagnosis: Unsteadiness on feet (R26.81);Difficulty in walking, not elsewhere classified (R26.2)     Time: 1610-9604 PT Time Calculation (min) (ACUTE ONLY): 30 min  Charges:  $Gait Training: 8-22 mins $Therapeutic Exercise: 8-22 mins                    G CodesTresa Endo PT [phone] }   [name] 04/16/2017, 1:31 PM

## 2017-04-18 DIAGNOSIS — Z471 Aftercare following joint replacement surgery: Secondary | ICD-10-CM | POA: Diagnosis not present

## 2017-04-18 DIAGNOSIS — Z79891 Long term (current) use of opiate analgesic: Secondary | ICD-10-CM | POA: Diagnosis not present

## 2017-04-18 DIAGNOSIS — Z96651 Presence of right artificial knee joint: Secondary | ICD-10-CM | POA: Diagnosis not present

## 2017-04-18 DIAGNOSIS — L439 Lichen planus, unspecified: Secondary | ICD-10-CM | POA: Diagnosis not present

## 2017-04-18 DIAGNOSIS — Z9181 History of falling: Secondary | ICD-10-CM | POA: Diagnosis not present

## 2017-04-18 DIAGNOSIS — K219 Gastro-esophageal reflux disease without esophagitis: Secondary | ICD-10-CM | POA: Diagnosis not present

## 2017-04-18 DIAGNOSIS — Z7982 Long term (current) use of aspirin: Secondary | ICD-10-CM | POA: Diagnosis not present

## 2017-04-18 DIAGNOSIS — M4800 Spinal stenosis, site unspecified: Secondary | ICD-10-CM | POA: Diagnosis not present

## 2017-04-25 DIAGNOSIS — Z9181 History of falling: Secondary | ICD-10-CM | POA: Diagnosis not present

## 2017-04-25 DIAGNOSIS — L439 Lichen planus, unspecified: Secondary | ICD-10-CM | POA: Diagnosis not present

## 2017-04-25 DIAGNOSIS — Z471 Aftercare following joint replacement surgery: Secondary | ICD-10-CM | POA: Diagnosis not present

## 2017-04-25 DIAGNOSIS — M4800 Spinal stenosis, site unspecified: Secondary | ICD-10-CM | POA: Diagnosis not present

## 2017-04-25 DIAGNOSIS — Z96651 Presence of right artificial knee joint: Secondary | ICD-10-CM | POA: Diagnosis not present

## 2017-04-25 DIAGNOSIS — Z7982 Long term (current) use of aspirin: Secondary | ICD-10-CM | POA: Diagnosis not present

## 2017-04-25 DIAGNOSIS — K219 Gastro-esophageal reflux disease without esophagitis: Secondary | ICD-10-CM | POA: Diagnosis not present

## 2017-04-25 DIAGNOSIS — Z79891 Long term (current) use of opiate analgesic: Secondary | ICD-10-CM | POA: Diagnosis not present

## 2017-04-27 DIAGNOSIS — Z96651 Presence of right artificial knee joint: Secondary | ICD-10-CM | POA: Diagnosis not present

## 2017-04-27 DIAGNOSIS — Z96611 Presence of right artificial shoulder joint: Secondary | ICD-10-CM | POA: Diagnosis not present

## 2017-04-27 DIAGNOSIS — Z471 Aftercare following joint replacement surgery: Secondary | ICD-10-CM | POA: Diagnosis not present

## 2017-05-04 DIAGNOSIS — H66002 Acute suppurative otitis media without spontaneous rupture of ear drum, left ear: Secondary | ICD-10-CM | POA: Diagnosis not present

## 2017-05-04 DIAGNOSIS — M1711 Unilateral primary osteoarthritis, right knee: Secondary | ICD-10-CM | POA: Diagnosis not present

## 2017-05-06 DIAGNOSIS — M1711 Unilateral primary osteoarthritis, right knee: Secondary | ICD-10-CM | POA: Diagnosis not present

## 2017-05-09 DIAGNOSIS — M1711 Unilateral primary osteoarthritis, right knee: Secondary | ICD-10-CM | POA: Diagnosis not present

## 2017-05-11 DIAGNOSIS — M1711 Unilateral primary osteoarthritis, right knee: Secondary | ICD-10-CM | POA: Diagnosis not present

## 2017-05-16 DIAGNOSIS — M1711 Unilateral primary osteoarthritis, right knee: Secondary | ICD-10-CM | POA: Diagnosis not present

## 2017-05-19 DIAGNOSIS — M1711 Unilateral primary osteoarthritis, right knee: Secondary | ICD-10-CM | POA: Diagnosis not present

## 2017-05-23 DIAGNOSIS — M1711 Unilateral primary osteoarthritis, right knee: Secondary | ICD-10-CM | POA: Diagnosis not present

## 2017-05-25 DIAGNOSIS — M1711 Unilateral primary osteoarthritis, right knee: Secondary | ICD-10-CM | POA: Diagnosis not present

## 2017-05-30 DIAGNOSIS — M1711 Unilateral primary osteoarthritis, right knee: Secondary | ICD-10-CM | POA: Diagnosis not present

## 2017-06-01 DIAGNOSIS — M1711 Unilateral primary osteoarthritis, right knee: Secondary | ICD-10-CM | POA: Diagnosis not present

## 2017-06-06 DIAGNOSIS — M1711 Unilateral primary osteoarthritis, right knee: Secondary | ICD-10-CM | POA: Diagnosis not present

## 2017-06-06 DIAGNOSIS — Z471 Aftercare following joint replacement surgery: Secondary | ICD-10-CM | POA: Diagnosis not present

## 2017-06-06 DIAGNOSIS — Z96651 Presence of right artificial knee joint: Secondary | ICD-10-CM | POA: Diagnosis not present

## 2017-06-07 DIAGNOSIS — Z23 Encounter for immunization: Secondary | ICD-10-CM | POA: Diagnosis not present

## 2017-06-07 DIAGNOSIS — H6122 Impacted cerumen, left ear: Secondary | ICD-10-CM | POA: Diagnosis not present

## 2017-06-07 DIAGNOSIS — H6992 Unspecified Eustachian tube disorder, left ear: Secondary | ICD-10-CM | POA: Diagnosis not present

## 2017-06-07 DIAGNOSIS — H9192 Unspecified hearing loss, left ear: Secondary | ICD-10-CM | POA: Diagnosis not present

## 2017-06-09 DIAGNOSIS — M1711 Unilateral primary osteoarthritis, right knee: Secondary | ICD-10-CM | POA: Diagnosis not present

## 2017-06-13 DIAGNOSIS — M1711 Unilateral primary osteoarthritis, right knee: Secondary | ICD-10-CM | POA: Diagnosis not present

## 2017-06-15 DIAGNOSIS — M1711 Unilateral primary osteoarthritis, right knee: Secondary | ICD-10-CM | POA: Diagnosis not present

## 2017-06-20 DIAGNOSIS — M1711 Unilateral primary osteoarthritis, right knee: Secondary | ICD-10-CM | POA: Diagnosis not present

## 2017-06-21 DIAGNOSIS — H903 Sensorineural hearing loss, bilateral: Secondary | ICD-10-CM | POA: Diagnosis not present

## 2017-06-21 DIAGNOSIS — H9313 Tinnitus, bilateral: Secondary | ICD-10-CM | POA: Diagnosis not present

## 2017-06-21 DIAGNOSIS — R42 Dizziness and giddiness: Secondary | ICD-10-CM | POA: Diagnosis not present

## 2017-06-21 DIAGNOSIS — H6983 Other specified disorders of Eustachian tube, bilateral: Secondary | ICD-10-CM | POA: Diagnosis not present

## 2017-06-21 DIAGNOSIS — H906 Mixed conductive and sensorineural hearing loss, bilateral: Secondary | ICD-10-CM | POA: Diagnosis not present

## 2017-06-26 DIAGNOSIS — H65492 Other chronic nonsuppurative otitis media, left ear: Secondary | ICD-10-CM | POA: Diagnosis not present

## 2017-06-26 DIAGNOSIS — H6982 Other specified disorders of Eustachian tube, left ear: Secondary | ICD-10-CM | POA: Diagnosis not present

## 2017-06-27 DIAGNOSIS — M1711 Unilateral primary osteoarthritis, right knee: Secondary | ICD-10-CM | POA: Diagnosis not present

## 2017-07-24 DIAGNOSIS — M533 Sacrococcygeal disorders, not elsewhere classified: Secondary | ICD-10-CM | POA: Diagnosis not present

## 2017-07-24 DIAGNOSIS — M545 Low back pain: Secondary | ICD-10-CM | POA: Diagnosis not present

## 2017-07-24 DIAGNOSIS — M5126 Other intervertebral disc displacement, lumbar region: Secondary | ICD-10-CM | POA: Diagnosis not present

## 2017-07-24 DIAGNOSIS — M5432 Sciatica, left side: Secondary | ICD-10-CM | POA: Diagnosis not present

## 2017-07-28 DIAGNOSIS — H6983 Other specified disorders of Eustachian tube, bilateral: Secondary | ICD-10-CM | POA: Diagnosis not present

## 2017-08-23 DIAGNOSIS — M545 Low back pain: Secondary | ICD-10-CM | POA: Diagnosis not present

## 2017-08-23 DIAGNOSIS — M5432 Sciatica, left side: Secondary | ICD-10-CM | POA: Diagnosis not present

## 2017-08-30 DIAGNOSIS — M545 Low back pain: Secondary | ICD-10-CM | POA: Diagnosis not present

## 2017-09-05 DIAGNOSIS — M5417 Radiculopathy, lumbosacral region: Secondary | ICD-10-CM | POA: Diagnosis not present

## 2017-09-05 DIAGNOSIS — M419 Scoliosis, unspecified: Secondary | ICD-10-CM | POA: Diagnosis not present

## 2017-09-05 DIAGNOSIS — M545 Low back pain: Secondary | ICD-10-CM | POA: Diagnosis not present

## 2017-09-19 DIAGNOSIS — M5416 Radiculopathy, lumbar region: Secondary | ICD-10-CM | POA: Diagnosis not present

## 2017-10-03 DIAGNOSIS — M5416 Radiculopathy, lumbar region: Secondary | ICD-10-CM | POA: Diagnosis not present

## 2017-10-03 DIAGNOSIS — M5126 Other intervertebral disc displacement, lumbar region: Secondary | ICD-10-CM | POA: Diagnosis not present

## 2017-10-03 DIAGNOSIS — M48061 Spinal stenosis, lumbar region without neurogenic claudication: Secondary | ICD-10-CM | POA: Diagnosis not present

## 2017-10-12 DIAGNOSIS — M5416 Radiculopathy, lumbar region: Secondary | ICD-10-CM | POA: Diagnosis not present

## 2017-10-25 DIAGNOSIS — H5213 Myopia, bilateral: Secondary | ICD-10-CM | POA: Diagnosis not present

## 2017-10-25 DIAGNOSIS — H16223 Keratoconjunctivitis sicca, not specified as Sjogren's, bilateral: Secondary | ICD-10-CM | POA: Diagnosis not present

## 2017-10-25 DIAGNOSIS — H40013 Open angle with borderline findings, low risk, bilateral: Secondary | ICD-10-CM | POA: Diagnosis not present

## 2017-10-25 DIAGNOSIS — H524 Presbyopia: Secondary | ICD-10-CM | POA: Diagnosis not present

## 2017-10-25 DIAGNOSIS — H52223 Regular astigmatism, bilateral: Secondary | ICD-10-CM | POA: Diagnosis not present

## 2017-10-31 DIAGNOSIS — M48061 Spinal stenosis, lumbar region without neurogenic claudication: Secondary | ICD-10-CM | POA: Diagnosis not present

## 2017-10-31 DIAGNOSIS — M5416 Radiculopathy, lumbar region: Secondary | ICD-10-CM | POA: Diagnosis not present

## 2017-11-02 ENCOUNTER — Ambulatory Visit: Payer: Self-pay | Admitting: Orthopedic Surgery

## 2017-11-07 ENCOUNTER — Ambulatory Visit: Payer: Self-pay | Admitting: Orthopedic Surgery

## 2017-11-07 NOTE — H&P (Addendum)
Linda Watson is an 82 y.o. female.   Chief Complaint: back and left leg pain HPI: [name] follows up today for recheck of her back. She is now about 6 months out from onset of symptoms. She is 2-1/2 weeks status post epidural at 5 1 on the left which provided maybe 2-3 hours of relief. She unfortunately reports ongoing pain in the left buttock, not really having any back pain, pain in the S1 distribution down the thigh then with numbness in the lower leg to the lateral aspect of the lower leg and numbness in the foot. She denies any weakness at this point and denies any groin pain or right-sided symptoms. She is taking Advil and Tylenol without relief. It has been quite some time since she has had bone density done but in the past it has been somewhat low. Dr. Tonita Cong discussed surgery with her at the last visit If refractory. At this point she is unable to find a comfortable position, she is unable to walk to her mailbox without pain, the worst position is standing still and she is somewhat better if she leans forward while standing. Lying down she is having a lot of difficulty finding a spot to sleep at night and ends up tossing and turning and switching from side to side. She does still feel the pain when sitting but it is not as bad as when she is standing still. She feels when she is sitting if she leans towards the right side away from her pain it does help somewhat. We replaced her right knee back in August and the knee is doing well. She denies any medical changes since that point in time, denies any chest pain or shortness of breath. She was cleared by her PCP at that point for knee replacement.  Past Medical History:  Diagnosis Date  . Arthritis    RIGHT KNEE PAIN AND ARTHRITIS  . Bee sting    left anterior hand, states she was stung yesterday 03-16-17, only c/o mild pain, apparent redness and edema observed; denies any severe allergy to been venom (ie no tongue swelling or throat closing); RN  encouraged pt to see her primary to r/o worsening or s/s of infection ; patient agreeable to this   . Fracture, patella    01/21/12 - immobilizer x one month   . GERD (gastroesophageal reflux disease)   . Hemorrhoids   . History of shingles   . Lichen planus    LESIONS  IN MOUTH AND OCCASIONALLY GENITAL AREA  . PONV (postoperative nausea and vomiting)   . Spinal stenosis    PAIN IN LOWER BACK AND DOWN LEFT LEG -ALSO HAS NUMBNESS AND TINGLING IN LEFT LEG TO FOOT    Past Surgical History:  Procedure Laterality Date  . ABDOMINAL HYSTERECTOMY    . BACK SURGERY    . BILATERAL KNEE ARTHROSCOPIES    . HERNIA REPAIR    . HIATAL HERNIA REPAIR    . LEFT SHOULDER SURGERY    . TOTAL KNEE ARTHROPLASTY Right 04/13/2017   Procedure: RIGHT TOTAL KNEE ARTHROPLASTY;  Surgeon: Susa Day, MD;  Location: [facility];  Service: Orthopedics;  Laterality: Right;  120 mins    No family history on file. Social History:  reports that  has never smoked. she has never used smokeless tobacco. She reports that she does not drink alcohol or use drugs.  Allergies:  Allergies  Allergen Reactions  . Codeine Nausea Only and Other (See Comments)  Makes pt crazy      (Not in a hospital admission)  No results found for this or any previous visit (from the past 48 hour(s)). No results found.  Review of Systems  Constitutional: Negative.   HENT: Negative.   Eyes: Negative.   Respiratory: Negative.   Cardiovascular: Negative.   Gastrointestinal: Negative.   Genitourinary: Negative.   Musculoskeletal: Positive for back pain.  Skin: Negative.   Neurological: Positive for sensory change and focal weakness.    There were no vitals taken for this visit. Physical Exam  Constitutional: She is oriented to person, place, and time. She appears well-developed and well-nourished. She appears distressed.  HENT:  Head: Normocephalic.  Eyes: Pupils are equal, round, and reactive to light.  Neck: Normal range of  motion.  Cardiovascular: Normal rate.  Respiratory: Effort normal.  GI: Soft.  Musculoskeletal:  Patient is awake, alert, and oriented 3. Well-nourished and well-developed. In mild distress due to pain. She ambulates with slight forward flexion of the lumbar spine, no assistive devices.  On examination of the lumbar spine, nontender to palpation through the spinous processes and paraspinous musculature, over the greater trochanter of the hips. Exquisitely tender in the left upper outer buttock. Decreased flexion and extension lumbar spine. Positive straight leg raise on the left reproducing buttock and leg pain, negative on the right. Trace weakness in the EHL on the left. No other lower extremity weakness noted, 5/5 throughout the hip flexor, quads, hamstrings, plantar and dorsiflexion, and right EHL. No groin pain with rotation of the hips bilaterally. Patellar and Achilles reflexes 2+. No clonus present, negative Babinski. Sensation intact distally. No calf pain or sign of DVT.   Neurological: She is alert and oriented to person, place, and time.  Skin: Skin is warm and dry.    Prior x-rays and MRI reviewed today by Dr. Tonita Cong, multifactorial stenosis noted at L5-S1 on the left due to hypertrophy of ligamentum flavum, lateral recess stenosis, and disc extrusion. She does have a grade 1 listhesis at L4-5, Multilevel disc degeneration and a mild scoliosis of the lumbar spine.  Assessment/Plan Impression: Ongoing left lower extremity radicular pain S1 distribution due to disc herniation/spinal stenosis at L5-S1 to the left  Plan: We again discussed relevant anatomy and etiology of her symptoms. Discussed the importance of activity modifications to avoid exacerbation and positions of comfort in the meantime. Symptoms unfortunately were refractory to epidural steroid injection, therefore we again discussed other options which at this point would recommend proceeding with microlumbar decompression L5-S1  on the left, possible L4-5. We discussed the procedure itself as well as risks, complications and alternatives including but not limited to DVT, PE, failure of procedure, need for secondary procedure, dural tear or CSF leak, anesthesia risk or even death. Discussed postop protocols and lumbar spine precautions as well as need for physical therapy. She underwent total knee replacement back in August with clearance from her PCP and has not had any medical changes since then and has also not had any episodes of chest pain or shortness of breath. We will wave her clearance at this point in time and proceed accordingly with scheduling. We will obtain preoperative chest x-ray, EKG and lab work for screening purposes. Patient was seen in conjunction with Dr. Tonita Cong today, all her questions were answered and she desires to proceed. We will see her back 10-14 days postop for suture removal and she will call with questions or concerns in the interim.  Anticipated LOS equal to  or greater than 2 midnights due to - Age 16 and older with one or more of the following:  - Obesity  - ASA Class 3 and higher  - Expected need for hospital services (PT, OT, Nursing) required for safe  discharge  - Anticipated need for postoperative skilled nursing care or inpatient rehab   Plan microlumbar decompression L5-S1 left, possible L4-5  Chakira Jachim, Conley Rolls., PA-C for Dr. Tonita Cong 11/07/2017, 3:27 PM

## 2017-11-07 NOTE — H&P (View-Only) (Signed)
Linda Watson is an 82 y.o. female.   Chief Complaint: back and left leg pain HPI: Linda Watson follows up today for recheck of her back. She is now about 6 months out from onset of symptoms. She is 2-1/2 weeks status post epidural at 5 1 on the left which provided maybe 2-3 hours of relief. She unfortunately reports ongoing pain in the left buttock, not really having any back pain, pain in the S1 distribution down the thigh then with numbness in the lower leg to the lateral aspect of the lower leg and numbness in the foot. She denies any weakness at this point and denies any groin pain or right-sided symptoms. She is taking Advil and Tylenol without relief. It has been quite some time since she has had bone density done but in the past it has been somewhat low. Dr. Tonita Cong discussed surgery with her at the last visit If refractory. At this point she is unable to find a comfortable position, she is unable to walk to her mailbox without pain, the worst position is standing still and she is somewhat better if she leans forward while standing. Lying down she is having a lot of difficulty finding a spot to sleep at night and ends up tossing and turning and switching from side to side. She does still feel the pain when sitting but it is not as bad as when she is standing still. She feels when she is sitting if she leans towards the right side away from her pain it does help somewhat. We replaced her right knee back in August and the knee is doing well. She denies any medical changes since that point in time, denies any chest pain or shortness of breath. She was cleared by her PCP at that point for knee replacement.  Past Medical History:  Diagnosis Date  . Arthritis    RIGHT KNEE PAIN AND ARTHRITIS  . Bee sting    left anterior hand, states she was stung yesterday 03-16-17, only c/o mild pain, apparent redness and edema observed; denies any severe allergy to been venom (ie no tongue swelling or throat closing); RN  encouraged pt to see her primary to r/o worsening or s/s of infection ; patient agreeable to this   . Fracture, patella    01/21/12 - immobilizer x one month   . GERD (gastroesophageal reflux disease)   . Hemorrhoids   . History of shingles   . Lichen planus    LESIONS  IN MOUTH AND OCCASIONALLY GENITAL AREA  . PONV (postoperative nausea and vomiting)   . Spinal stenosis    PAIN IN LOWER BACK AND DOWN LEFT LEG -ALSO HAS NUMBNESS AND TINGLING IN LEFT LEG TO FOOT    Past Surgical History:  Procedure Laterality Date  . ABDOMINAL HYSTERECTOMY    . BACK SURGERY    . BILATERAL KNEE ARTHROSCOPIES    . HERNIA REPAIR    . HIATAL HERNIA REPAIR    . LEFT SHOULDER SURGERY    . TOTAL KNEE ARTHROPLASTY Right 04/13/2017   Procedure: RIGHT TOTAL KNEE ARTHROPLASTY;  Surgeon: Susa Day, MD;  Location: [facility];  Service: Orthopedics;  Laterality: Right;  120 mins    No family history on file. Social History:  reports that  has never smoked. she has never used smokeless tobacco. She reports that she does not drink alcohol or use drugs.  Allergies:  Allergies  Allergen Reactions  . Codeine Nausea Only and Other (See Comments)  Makes pt crazy      (Not in a hospital admission)  No results found for this or any previous visit (from the past 48 hour(s)). No results found.  Review of Systems  Constitutional: Negative.   HENT: Negative.   Eyes: Negative.   Respiratory: Negative.   Cardiovascular: Negative.   Gastrointestinal: Negative.   Genitourinary: Negative.   Musculoskeletal: Positive for back pain.  Skin: Negative.   Neurological: Positive for sensory change and focal weakness.    There were no vitals taken for this visit. Physical Exam  Constitutional: She is oriented to person, place, and time. She appears well-developed and well-nourished. She appears distressed.  HENT:  Head: Normocephalic.  Eyes: Pupils are equal, round, and reactive to light.  Neck: Normal range of  motion.  Cardiovascular: Normal rate.  Respiratory: Effort normal.  GI: Soft.  Musculoskeletal:  Patient is awake, alert, and oriented 3. Well-nourished and well-developed. In mild distress due to pain. She ambulates with slight forward flexion of the lumbar spine, no assistive devices.  On examination of the lumbar spine, nontender to palpation through the spinous processes and paraspinous musculature, over the greater trochanter of the hips. Exquisitely tender in the left upper outer buttock. Decreased flexion and extension lumbar spine. Positive straight leg raise on the left reproducing buttock and leg pain, negative on the right. Trace weakness in the EHL on the left. No other lower extremity weakness noted, 5/5 throughout the hip flexor, quads, hamstrings, plantar and dorsiflexion, and right EHL. No groin pain with rotation of the hips bilaterally. Patellar and Achilles reflexes 2+. No clonus present, negative Babinski. Sensation intact distally. No calf pain or sign of DVT.   Neurological: She is alert and oriented to person, place, and time.  Skin: Skin is warm and dry.    Prior x-rays and MRI reviewed today by Dr. Tonita Cong, multifactorial stenosis noted at L5-S1 on the left due to hypertrophy of ligamentum flavum, lateral recess stenosis, and disc extrusion. She does have a grade 1 listhesis at L4-5, Multilevel disc degeneration and a mild scoliosis of the lumbar spine.  Assessment/Plan Impression: Ongoing left lower extremity radicular pain S1 distribution due to disc herniation/spinal stenosis at L5-S1 to the left  Plan: We again discussed relevant anatomy and etiology of her symptoms. Discussed the importance of activity modifications to avoid exacerbation and positions of comfort in the meantime. Symptoms unfortunately were refractory to epidural steroid injection, therefore we again discussed other options which at this point would recommend proceeding with microlumbar decompression L5-S1  on the left, possible L4-5. We discussed the procedure itself as well as risks, complications and alternatives including but not limited to DVT, PE, failure of procedure, need for secondary procedure, dural tear or CSF leak, anesthesia risk or even death. Discussed postop protocols and lumbar spine precautions as well as need for physical therapy. She underwent total knee replacement back in August with clearance from her PCP and has not had any medical changes since then and has also not had any episodes of chest pain or shortness of breath. We will wave her clearance at this point in time and proceed accordingly with scheduling. We will obtain preoperative chest x-ray, EKG and lab work for screening purposes. Patient was seen in conjunction with Dr. Tonita Cong today, all her questions were answered and she desires to proceed. We will see her back 10-14 days postop for suture removal and she will call with questions or concerns in the interim.  Anticipated LOS equal to  or greater than 2 midnights due to - Age 60 and older with one or more of the following:  - Obesity  - ASA Class 3 and higher  - Expected need for hospital services (PT, OT, Nursing) required for safe  discharge  - Anticipated need for postoperative skilled nursing care or inpatient rehab   Plan microlumbar decompression L5-S1 left, possible L4-5  York Valliant, Conley Rolls., PA-C for Dr. Tonita Cong 11/07/2017, 3:27 PM

## 2017-11-20 NOTE — Pre-Procedure Instructions (Signed)
Linda Watson  11/20/2017      [id number] - [place], [place] - [address] [place] [address] Phone: [phone] Fax: [phone]    Your procedure is scheduled on Thurs., Mar. 28, 2019  Report to [facility] Admitting Entrance "A" at 8:30AM   Call this number if you have problems the morning of surgery:  [phone]   Remember:  Do not eat food or drink liquids after midnight.  Take these medicines the morning of surgery with A SIP OF WATER: Cetirizine (ZYRTEC), Pantoprazole (PROTONIX), and SYSTANE ULTRA eye drops. If needed ValACYclovir (VALTREX).  As of today, stop taking all Aspirins, Vitamins, Fish oils, and Herbal medications. Also stop all NSAIDS i.e. Advil, Ibuprofen, Motrin, Aleve, Anaprox, Naproxen, BC and Goody Powders. Including: Meloxicam (MOBIC) and Biotin.   Do not wear jewelry, make-up or nail polish.  Do not wear lotions, powders,  perfumes, or deodorant.  Do not shave 48 hours prior to surgery.   Do not bring valuables to the hospital.  [facility] is not responsible for any belongings or valuables.  Contacts, dentures or bridgework may not be worn into surgery.  Leave your suitcase in the car.  After surgery it may be brought to your room.  For patients admitted to the hospital, discharge time will be determined by your treatment team.  Patients discharged the day of surgery will not be allowed to drive home.   Special instructions:  [facility]- Preparing For Surgery  Before surgery, you can play an important role. Because skin is not sterile, your skin needs to be as free of germs as possible. You can reduce the number of germs on your skin by washing with CHG (chlorahexidine gluconate) Soap before surgery.  CHG is an antiseptic cleaner which kills germs and bonds with the skin to continue killing germs even after washing.  Please do not use if you have an allergy to  CHG or antibacterial soaps. If your skin becomes reddened/irritated stop using the CHG.  Do not shave (including legs and underarms) for at least 48 hours prior to first CHG shower. It is OK to shave your face.  Please follow these instructions carefully.   1. Shower the NIGHT BEFORE SURGERY and the MORNING OF SURGERY with CHG.   2. If you chose to wash your hair, wash your hair first as usual with your normal shampoo.  3. After you shampoo, rinse your hair and body thoroughly to remove the shampoo.  4. Use CHG as you would any other liquid soap. You can apply CHG directly to the skin and wash gently with a scrungie or a clean washcloth.   5. Apply the CHG Soap to your body ONLY FROM THE NECK DOWN.  Do not use on open wounds or open sores. Avoid contact with your eyes, ears, mouth and genitals (private parts). Wash Face and genitals (private parts)  with your normal soap.  6. Wash thoroughly, paying special attention to the area where your surgery will be performed.  7. Thoroughly rinse your body with warm water from the neck down.  8. DO NOT shower/wash with your normal soap after using and rinsing off the CHG Soap.  9. Pat yourself dry with a CLEAN TOWEL.  10. Wear CLEAN PAJAMAS to bed the night before surgery, wear comfortable clothes the morning of surgery  11. Place CLEAN SHEETS on your bed the night  of your first shower and DO NOT SLEEP WITH PETS.  Day of Surgery: Do not apply any deodorants/lotions. Please wear clean clothes to the hospital/surgery center.    Please read over the following fact sheets that you were given. Pain Booklet, Coughing and Deep Breathing, MRSA Information and Surgical Site Infection Prevention

## 2017-11-21 ENCOUNTER — Encounter (HOSPITAL_COMMUNITY): Payer: Self-pay

## 2017-11-21 ENCOUNTER — Other Ambulatory Visit: Payer: Self-pay

## 2017-11-21 ENCOUNTER — Encounter (HOSPITAL_COMMUNITY)
Admission: RE | Admit: 2017-11-21 | Discharge: 2017-11-21 | Disposition: A | Discharge status: Home / Self Care | Payer: PPO | Source: Ambulatory Visit | Attending: Specialist | Admitting: Specialist

## 2017-11-21 ENCOUNTER — Ambulatory Visit (HOSPITAL_COMMUNITY)
Admission: RE | Admit: 2017-11-21 | Discharge: 2017-11-21 | Disposition: A | Discharge status: Home / Self Care | Payer: PPO | Source: Ambulatory Visit | Attending: Orthopedic Surgery | Admitting: Orthopedic Surgery

## 2017-11-21 DIAGNOSIS — M8588 Other specified disorders of bone density and structure, other site: Secondary | ICD-10-CM | POA: Insufficient documentation

## 2017-11-21 DIAGNOSIS — M5126 Other intervertebral disc displacement, lumbar region: Secondary | ICD-10-CM

## 2017-11-21 DIAGNOSIS — Z01810 Encounter for preprocedural cardiovascular examination: Secondary | ICD-10-CM | POA: Diagnosis not present

## 2017-11-21 DIAGNOSIS — S32010A Wedge compression fracture of first lumbar vertebra, initial encounter for closed fracture: Secondary | ICD-10-CM | POA: Diagnosis not present

## 2017-11-21 DIAGNOSIS — M4185 Other forms of scoliosis, thoracolumbar region: Secondary | ICD-10-CM | POA: Diagnosis not present

## 2017-11-21 DIAGNOSIS — Z01812 Encounter for preprocedural laboratory examination: Secondary | ICD-10-CM | POA: Diagnosis not present

## 2017-11-21 DIAGNOSIS — M4856XA Collapsed vertebra, not elsewhere classified, lumbar region, initial encounter for fracture: Secondary | ICD-10-CM | POA: Insufficient documentation

## 2017-11-21 DIAGNOSIS — R0989 Other specified symptoms and signs involving the circulatory and respiratory systems: Secondary | ICD-10-CM | POA: Diagnosis not present

## 2017-11-21 HISTORY — DX: Other intervertebral disc displacement, lumbar region: M51.26

## 2017-11-21 HISTORY — DX: Spinal stenosis, lumbar region without neurogenic claudication: M48.061

## 2017-11-21 HISTORY — DX: Personal history of other diseases of the digestive system: Z87.19

## 2017-11-21 LAB — CBC
HEMATOCRIT: 39 % (ref 36.0–46.0)
Hemoglobin: 12.5 g/dL (ref 12.0–15.0)
MCH: 30 pg (ref 26.0–34.0)
MCHC: 32.1 g/dL (ref 30.0–36.0)
MCV: 93.8 fL (ref 78.0–100.0)
PLATELETS: 267 10*3/uL (ref 150–400)
RBC: 4.16 MIL/uL (ref 3.87–5.11)
RDW: 14.8 % (ref 11.5–15.5)
WBC: 6.1 10*3/uL (ref 4.0–10.5)

## 2017-11-21 LAB — BASIC METABOLIC PANEL
Anion gap: 7 (ref 5–15)
BUN: 22 mg/dL — ABNORMAL HIGH (ref 6–20)
CHLORIDE: 106 mmol/L (ref 101–111)
CO2: 25 mmol/L (ref 22–32)
CREATININE: 0.78 mg/dL (ref 0.44–1.00)
Calcium: 8.8 mg/dL — ABNORMAL LOW (ref 8.9–10.3)
Glucose, Bld: 94 mg/dL (ref 65–99)
POTASSIUM: 4.4 mmol/L (ref 3.5–5.1)
SODIUM: 138 mmol/L (ref 135–145)

## 2017-11-21 LAB — SURGICAL PCR SCREEN
MRSA, PCR: NEGATIVE
STAPHYLOCOCCUS AUREUS: NEGATIVE

## 2017-11-21 NOTE — Progress Notes (Signed)
PCP - Dr. Redmond Pulling PA Gentry Roch- [facility] [phone]  Cardiologist - Denies  Chest x-ray - 11/21/17  EKG - 02/02/17 (CE)- Requested  Stress Test - Denies  ECHO - Denies  Cardiac Cath - Denies  Sleep Study - Denies CPAP - None  LABS- 11/21/17: CBC, BMP   Anesthesia- Yes- requested records  Pt denies having chest pain, sob, or fever at this time. All instructions explained to the pt, with a verbal understanding of the material. Pt agrees to go over the instructions while at home for a better understanding. The opportunity to ask questions was provided.

## 2017-11-22 NOTE — Progress Notes (Signed)
Anesthesia chart review: Patient is a 82 year old female scheduled for microlumbar decompression L5-S1 left, possible L4-5 on 11/23/2017 by Dr. Susa Day.  History includes never smoker, post-operative N/V, GERD, arthritis, hiatal hernia (s/p repair), lumbar stenosis, hysterectomy, right TKA 04/13/17, L4-S1 decompression/foraminotomies 03/22/12.  PCP is Christain Sacramento, MD/Kristen Deatra Ina, PA-C  Meds include methotrexate (on hold), Protonix, Zyrtec.  BP 123/63   Pulse 72   Temp 36.6 C   Resp 20   Ht 5\' 2"  (1.575 m)   Wt 145 lb 4.8 oz (65.9 kg)   SpO2 97%   BMI 26.58 kg/m   EKG 02/02/17 (PCP; scanned under Media tab, Correspondence, 04/13/17): SB at 59 bpm, poor R wave progression across precordium.  Minor T wave changes.  (By report) When compared to ECG of 02/09/2000 no significant change was found.  CXR 11/21/17: IMPRESSION: No acute cardiopulmonary disease.  Preoperative labs noted.  If no acute changes then I anticipate that she can proceed as planned.  [facility] Short Stay Center/Anesthesiology Phone [phone] 11/22/2017 10:13 AM

## 2017-11-23 ENCOUNTER — Ambulatory Visit (HOSPITAL_COMMUNITY): Payer: PPO | Admitting: Critical Care Medicine

## 2017-11-23 ENCOUNTER — Encounter (HOSPITAL_COMMUNITY)
Admission: RE | Disposition: A | Discharge status: Home / Self Care | Payer: Self-pay | Source: Ambulatory Visit | Attending: Specialist

## 2017-11-23 ENCOUNTER — Encounter (HOSPITAL_COMMUNITY): Payer: Self-pay | Admitting: *Deleted

## 2017-11-23 ENCOUNTER — Other Ambulatory Visit: Payer: Self-pay

## 2017-11-23 ENCOUNTER — Ambulatory Visit (HOSPITAL_COMMUNITY): Payer: PPO

## 2017-11-23 ENCOUNTER — Ambulatory Visit (HOSPITAL_COMMUNITY)
Admission: RE | Admit: 2017-11-23 | Discharge: 2017-11-24 | Disposition: A | Discharge status: Home / Self Care | Payer: PPO | Source: Ambulatory Visit | Attending: Specialist | Admitting: Specialist

## 2017-11-23 ENCOUNTER — Ambulatory Visit (HOSPITAL_COMMUNITY): Payer: PPO | Admitting: Vascular Surgery

## 2017-11-23 DIAGNOSIS — M48061 Spinal stenosis, lumbar region without neurogenic claudication: Secondary | ICD-10-CM | POA: Diagnosis present

## 2017-11-23 DIAGNOSIS — K219 Gastro-esophageal reflux disease without esophagitis: Secondary | ICD-10-CM | POA: Insufficient documentation

## 2017-11-23 DIAGNOSIS — M1990 Unspecified osteoarthritis, unspecified site: Secondary | ICD-10-CM | POA: Insufficient documentation

## 2017-11-23 DIAGNOSIS — M419 Scoliosis, unspecified: Secondary | ICD-10-CM | POA: Diagnosis not present

## 2017-11-23 DIAGNOSIS — Z8619 Personal history of other infectious and parasitic diseases: Secondary | ICD-10-CM | POA: Diagnosis not present

## 2017-11-23 DIAGNOSIS — M4807 Spinal stenosis, lumbosacral region: Secondary | ICD-10-CM | POA: Diagnosis not present

## 2017-11-23 DIAGNOSIS — E669 Obesity, unspecified: Secondary | ICD-10-CM | POA: Insufficient documentation

## 2017-11-23 DIAGNOSIS — M48062 Spinal stenosis, lumbar region with neurogenic claudication: Secondary | ICD-10-CM | POA: Diagnosis not present

## 2017-11-23 DIAGNOSIS — Z79899 Other long term (current) drug therapy: Secondary | ICD-10-CM | POA: Insufficient documentation

## 2017-11-23 DIAGNOSIS — Z96651 Presence of right artificial knee joint: Secondary | ICD-10-CM | POA: Insufficient documentation

## 2017-11-23 DIAGNOSIS — Z419 Encounter for procedure for purposes other than remedying health state, unspecified: Secondary | ICD-10-CM

## 2017-11-23 DIAGNOSIS — M47816 Spondylosis without myelopathy or radiculopathy, lumbar region: Secondary | ICD-10-CM | POA: Diagnosis not present

## 2017-11-23 DIAGNOSIS — M8580 Other specified disorders of bone density and structure, unspecified site: Secondary | ICD-10-CM | POA: Insufficient documentation

## 2017-11-23 DIAGNOSIS — M1711 Unilateral primary osteoarthritis, right knee: Secondary | ICD-10-CM | POA: Diagnosis not present

## 2017-11-23 DIAGNOSIS — K449 Diaphragmatic hernia without obstruction or gangrene: Secondary | ICD-10-CM | POA: Insufficient documentation

## 2017-11-23 DIAGNOSIS — M5127 Other intervertebral disc displacement, lumbosacral region: Secondary | ICD-10-CM | POA: Diagnosis not present

## 2017-11-23 DIAGNOSIS — Z885 Allergy status to narcotic agent status: Secondary | ICD-10-CM | POA: Diagnosis not present

## 2017-11-23 HISTORY — DX: Other seasonal allergic rhinitis: J30.2

## 2017-11-23 HISTORY — PX: LUMBAR LAMINECTOMY/DECOMPRESSION MICRODISCECTOMY: SHX5026

## 2017-11-23 SURGERY — LUMBAR LAMINECTOMY/DECOMPRESSION MICRODISCECTOMY 1 LEVEL
Anesthesia: General | Site: Spine Lumbar | Laterality: Left

## 2017-11-23 MED ORDER — KCL IN DEXTROSE-NACL 20-5-0.45 MEQ/L-%-% IV SOLN: INTRAVENOUS | Status: DC

## 2017-11-23 MED ORDER — LIDOCAINE HCL (CARDIAC) 20 MG/ML IV SOLN
INTRAVENOUS | Status: AC
  Filled 2017-11-23: qty 10

## 2017-11-23 MED ORDER — PHENYLEPHRINE 40 MCG/ML (10ML) SYRINGE FOR IV PUSH (FOR BLOOD PRESSURE SUPPORT)
PREFILLED_SYRINGE | INTRAVENOUS | Status: AC
  Filled 2017-11-23: qty 10

## 2017-11-23 MED ORDER — SODIUM CHLORIDE 0.9 % IR SOLN
Status: DC | PRN
  Administered 2017-11-23: 500 mL

## 2017-11-23 MED ORDER — BISACODYL 5 MG PO TBEC: 5.0000 mg | DELAYED_RELEASE_TABLET | Freq: Every day | ORAL | Status: DC | PRN

## 2017-11-23 MED ORDER — DEXAMETHASONE SODIUM PHOSPHATE 10 MG/ML IJ SOLN
INTRAMUSCULAR | Status: AC
  Filled 2017-11-23: qty 2

## 2017-11-23 MED ORDER — PROPOFOL 10 MG/ML IV BOLUS
INTRAVENOUS | Status: AC
  Filled 2017-11-23: qty 20

## 2017-11-23 MED ORDER — RISAQUAD PO CAPS
1.0000 | ORAL_CAPSULE | Freq: Every day | ORAL | Status: DC
  Filled 2017-11-23 (×2): qty 1

## 2017-11-23 MED ORDER — ONDANSETRON HCL 4 MG PO TABS: 4.0000 mg | ORAL_TABLET | Freq: Four times a day (QID) | ORAL | Status: DC | PRN

## 2017-11-23 MED ORDER — ACETAMINOPHEN 10 MG/ML IV SOLN
1000.0000 mg | Freq: Four times a day (QID) | INTRAVENOUS | Status: DC
  Filled 2017-11-23 (×3): qty 100

## 2017-11-23 MED ORDER — ACETAMINOPHEN 500 MG PO TABS
1000.0000 mg | ORAL_TABLET | Freq: Four times a day (QID) | ORAL | Status: AC
  Administered 2017-11-23 – 2017-11-24 (×3): 1000 mg via ORAL
  Filled 2017-11-23 (×3): qty 2

## 2017-11-23 MED ORDER — FENTANYL CITRATE (PF) 250 MCG/5ML IJ SOLN
INTRAMUSCULAR | Status: AC
  Filled 2017-11-23: qty 5

## 2017-11-23 MED ORDER — MEPERIDINE HCL 50 MG/ML IJ SOLN
INTRAMUSCULAR | Status: AC
  Administered 2017-11-23: 12.5 mg via INTRAVENOUS
  Filled 2017-11-23: qty 1

## 2017-11-23 MED ORDER — POLYVINYL ALCOHOL 1.4 % OP SOLN
1.0000 [drp] | OPHTHALMIC | Status: DC | PRN
  Filled 2017-11-23: qty 15

## 2017-11-23 MED ORDER — HYDROMORPHONE HCL 1 MG/ML IJ SOLN: 0.5000 mg | INTRAMUSCULAR | Status: DC | PRN

## 2017-11-23 MED ORDER — METHOCARBAMOL 500 MG PO TABS: 500.0000 mg | ORAL_TABLET | Freq: Four times a day (QID) | ORAL | Status: DC | PRN

## 2017-11-23 MED ORDER — POLYETHYLENE GLYCOL 3350 17 G PO PACK
17.0000 g | PACK | Freq: Every day | ORAL | Status: DC
  Administered 2017-11-23: 17 g via ORAL
  Filled 2017-11-23: qty 1

## 2017-11-23 MED ORDER — CEFAZOLIN SODIUM-DEXTROSE 2-4 GM/100ML-% IV SOLN
INTRAVENOUS | Status: AC
  Filled 2017-11-23: qty 100

## 2017-11-23 MED ORDER — SUCCINYLCHOLINE CHLORIDE 200 MG/10ML IV SOSY
PREFILLED_SYRINGE | INTRAVENOUS | Status: AC
Start: 2017-11-23 — End: 2017-11-23
  Filled 2017-11-23: qty 10

## 2017-11-23 MED ORDER — ROCURONIUM BROMIDE 10 MG/ML (PF) SYRINGE
PREFILLED_SYRINGE | INTRAVENOUS | Status: AC
  Filled 2017-11-23: qty 10

## 2017-11-23 MED ORDER — THROMBIN 20000 UNITS EX SOLR
CUTANEOUS | Status: AC
  Filled 2017-11-23: qty 20000

## 2017-11-23 MED ORDER — POLYETHYLENE GLYCOL 3350 17 G PO PACK
17.0000 g | PACK | Freq: Every day | ORAL | Status: DC | PRN
  Administered 2017-11-23: 17 g via ORAL

## 2017-11-23 MED ORDER — FENTANYL CITRATE (PF) 100 MCG/2ML IJ SOLN: 25.0000 ug | INTRAMUSCULAR | Status: DC | PRN

## 2017-11-23 MED ORDER — ONDANSETRON HCL 4 MG/2ML IJ SOLN
INTRAMUSCULAR | Status: AC
  Filled 2017-11-23: qty 4

## 2017-11-23 MED ORDER — SCOPOLAMINE 1 MG/3DAYS TD PT72
MEDICATED_PATCH | TRANSDERMAL | Status: DC | PRN
  Administered 2017-11-23: 1 via TRANSDERMAL

## 2017-11-23 MED ORDER — DOCUSATE SODIUM 100 MG PO CAPS
100.0000 mg | ORAL_CAPSULE | Freq: Two times a day (BID) | ORAL | Status: DC
  Administered 2017-11-23: 100 mg via ORAL
  Filled 2017-11-23: qty 1

## 2017-11-23 MED ORDER — PROPOFOL 10 MG/ML IV BOLUS
INTRAVENOUS | Status: DC | PRN
  Administered 2017-11-23: 150 mg via INTRAVENOUS

## 2017-11-23 MED ORDER — ALUM & MAG HYDROXIDE-SIMETH 200-200-20 MG/5ML PO SUSP: 30.0000 mL | Freq: Four times a day (QID) | ORAL | Status: DC | PRN

## 2017-11-23 MED ORDER — LIDOCAINE 2% (20 MG/ML) 5 ML SYRINGE
INTRAMUSCULAR | Status: DC | PRN
  Administered 2017-11-23: 50 mg via INTRAVENOUS

## 2017-11-23 MED ORDER — PHENYLEPHRINE 40 MCG/ML (10ML) SYRINGE FOR IV PUSH (FOR BLOOD PRESSURE SUPPORT)
PREFILLED_SYRINGE | INTRAVENOUS | Status: DC | PRN
  Administered 2017-11-23 (×3): 80 ug via INTRAVENOUS

## 2017-11-23 MED ORDER — LORATADINE 10 MG PO TABS: 10.0000 mg | ORAL_TABLET | Freq: Every day | ORAL | Status: DC

## 2017-11-23 MED ORDER — PHENOL 1.4 % MT LIQD: 1.0000 | OROMUCOSAL | Status: DC | PRN

## 2017-11-23 MED ORDER — THROMBIN (RECOMBINANT) 20000 UNITS EX SOLR
CUTANEOUS | Status: DC | PRN
  Administered 2017-11-23: 20 mL via TOPICAL

## 2017-11-23 MED ORDER — PANTOPRAZOLE SODIUM 40 MG PO TBEC
40.0000 mg | DELAYED_RELEASE_TABLET | Freq: Every day | ORAL | Status: DC
  Administered 2017-11-24: 40 mg via ORAL
  Filled 2017-11-23: qty 1

## 2017-11-23 MED ORDER — MEPERIDINE HCL 50 MG/ML IJ SOLN
12.5000 mg | Freq: Once | INTRAMUSCULAR | Status: AC
  Administered 2017-11-23: 12.5 mg via INTRAVENOUS

## 2017-11-23 MED ORDER — ARTIFICIAL TEARS OPHTHALMIC OINT
TOPICAL_OINTMENT | OPHTHALMIC | Status: AC
  Filled 2017-11-23: qty 7

## 2017-11-23 MED ORDER — OXYCODONE HCL 5 MG PO TABS: 5.0000 mg | ORAL_TABLET | ORAL | Status: DC | PRN

## 2017-11-23 MED ORDER — CEFAZOLIN SODIUM-DEXTROSE 1-4 GM/50ML-% IV SOLN
1.0000 g | Freq: Three times a day (TID) | INTRAVENOUS | Status: AC
  Administered 2017-11-23 – 2017-11-24 (×2): 1 g via INTRAVENOUS
  Filled 2017-11-23 (×2): qty 50

## 2017-11-23 MED ORDER — ACETAMINOPHEN 10 MG/ML IV SOLN
INTRAVENOUS | Status: AC
  Filled 2017-11-23: qty 100

## 2017-11-23 MED ORDER — PHENYLEPHRINE HCL 10 MG/ML IJ SOLN
INTRAVENOUS | Status: DC | PRN
  Administered 2017-11-23: 25 ug/min via INTRAVENOUS

## 2017-11-23 MED ORDER — ACETAMINOPHEN 325 MG PO TABS
650.0000 mg | ORAL_TABLET | ORAL | Status: DC | PRN
  Administered 2017-11-24: 650 mg via ORAL

## 2017-11-23 MED ORDER — ONDANSETRON HCL 4 MG/2ML IJ SOLN: 4.0000 mg | Freq: Four times a day (QID) | INTRAMUSCULAR | Status: DC | PRN

## 2017-11-23 MED ORDER — SUGAMMADEX SODIUM 200 MG/2ML IV SOLN
INTRAVENOUS | Status: AC
  Filled 2017-11-23: qty 2

## 2017-11-23 MED ORDER — SUGAMMADEX SODIUM 200 MG/2ML IV SOLN
INTRAVENOUS | Status: DC | PRN
  Administered 2017-11-23: 130 mg via INTRAVENOUS

## 2017-11-23 MED ORDER — BUPIVACAINE-EPINEPHRINE (PF) 0.5% -1:200000 IJ SOLN
INTRAMUSCULAR | Status: AC
  Filled 2017-11-23: qty 30

## 2017-11-23 MED ORDER — ROCURONIUM BROMIDE 10 MG/ML (PF) SYRINGE
PREFILLED_SYRINGE | INTRAVENOUS | Status: DC | PRN
  Administered 2017-11-23: 50 mg via INTRAVENOUS

## 2017-11-23 MED ORDER — EPHEDRINE 5 MG/ML INJ
INTRAVENOUS | Status: AC
  Filled 2017-11-23: qty 10

## 2017-11-23 MED ORDER — ACETAMINOPHEN 650 MG RE SUPP: 650.0000 mg | RECTAL | Status: DC | PRN

## 2017-11-23 MED ORDER — POLYETHYLENE GLYCOL 3350 17 GM/SCOOP PO POWD: 17.0000 g | Freq: Every day | ORAL | Status: DC

## 2017-11-23 MED ORDER — PHENYLEPHRINE HCL 10 MG/ML IJ SOLN
INTRAMUSCULAR | Status: AC
  Filled 2017-11-23: qty 1

## 2017-11-23 MED ORDER — METHOCARBAMOL 1000 MG/10ML IJ SOLN
500.0000 mg | Freq: Four times a day (QID) | INTRAMUSCULAR | Status: DC | PRN
  Filled 2017-11-23: qty 5

## 2017-11-23 MED ORDER — MAGNESIUM CITRATE PO SOLN: 1.0000 | Freq: Once | ORAL | Status: DC | PRN

## 2017-11-23 MED ORDER — FENTANYL CITRATE (PF) 250 MCG/5ML IJ SOLN
INTRAMUSCULAR | Status: DC | PRN
  Administered 2017-11-23 (×4): 50 ug via INTRAVENOUS

## 2017-11-23 MED ORDER — ONDANSETRON HCL 4 MG/2ML IJ SOLN
INTRAMUSCULAR | Status: DC | PRN
  Administered 2017-11-23: 4 mg via INTRAVENOUS

## 2017-11-23 MED ORDER — ONDANSETRON HCL 4 MG/2ML IJ SOLN: 4.0000 mg | Freq: Once | INTRAMUSCULAR | Status: DC | PRN

## 2017-11-23 MED ORDER — VITAMIN B-12 1000 MCG PO TABS
1000.0000 ug | ORAL_TABLET | Freq: Every day | ORAL | Status: DC
  Filled 2017-11-23: qty 1

## 2017-11-23 MED ORDER — MENTHOL 3 MG MT LOZG: 1.0000 | LOZENGE | OROMUCOSAL | Status: DC | PRN

## 2017-11-23 MED ORDER — CEFAZOLIN SODIUM-DEXTROSE 2-4 GM/100ML-% IV SOLN
2.0000 g | INTRAVENOUS | Status: AC
  Administered 2017-11-23: 2 g via INTRAVENOUS

## 2017-11-23 MED ORDER — LACTATED RINGERS IV SOLN
INTRAVENOUS | Status: DC
  Administered 2017-11-23 (×2): via INTRAVENOUS

## 2017-11-23 MED ORDER — 0.9 % SODIUM CHLORIDE (POUR BTL) OPTIME
TOPICAL | Status: DC | PRN
  Administered 2017-11-23: 1000 mL

## 2017-11-23 MED ORDER — ACETAMINOPHEN 10 MG/ML IV SOLN
1000.0000 mg | INTRAVENOUS | Status: AC
Start: 2017-11-23 — End: 2017-11-23
  Administered 2017-11-23: 1000 mg via INTRAVENOUS

## 2017-11-23 MED ORDER — DEXAMETHASONE SODIUM PHOSPHATE 10 MG/ML IJ SOLN
INTRAMUSCULAR | Status: DC | PRN
  Administered 2017-11-23: 8 mg via INTRAVENOUS

## 2017-11-23 MED ORDER — PHENYLEPHRINE 40 MCG/ML (10ML) SYRINGE FOR IV PUSH (FOR BLOOD PRESSURE SUPPORT)
PREFILLED_SYRINGE | INTRAVENOUS | Status: AC
  Filled 2017-11-23: qty 20

## 2017-11-23 MED ORDER — POLYETHYL GLYCOL-PROPYL GLYCOL 0.4-0.3 % OP SOLN: Freq: Three times a day (TID) | OPHTHALMIC | Status: DC

## 2017-11-23 MED ORDER — BUPIVACAINE-EPINEPHRINE 0.5% -1:200000 IJ SOLN
INTRAMUSCULAR | Status: DC | PRN
  Administered 2017-11-23: 5 mL

## 2017-11-23 SURGICAL SUPPLY — 56 items
BAG DECANTER FOR FLEXI CONT (MISCELLANEOUS) ×3 IMPLANT
CLEANER TIP ELECTROSURG 2X2 (MISCELLANEOUS) ×1 IMPLANT
CLOSURE WOUND 1/2 X4 (GAUZE/BANDAGES/DRESSINGS)
CLOTH 2% CHLOROHEXIDINE 3PK (PERSONAL CARE ITEMS) ×3 IMPLANT
DRAPE HALF SHEET 40X57 (DRAPES) ×2 IMPLANT
DRAPE LAPAROTOMY 100X72X124 (DRAPES) ×3 IMPLANT
DRAPE MICROSCOPE LEICA (MISCELLANEOUS) ×3 IMPLANT
DRAPE SHEET LG 3/4 BI-LAMINATE (DRAPES) ×3 IMPLANT
DRAPE SURG 17X11 SM STRL (DRAPES) ×3 IMPLANT
DRAPE UTILITY XL STRL (DRAPES) ×3 IMPLANT
DRSG AQUACEL AG ADV 3.5X 4 (GAUZE/BANDAGES/DRESSINGS) IMPLANT
DRSG AQUACEL AG ADV 3.5X 6 (GAUZE/BANDAGES/DRESSINGS) ×2 IMPLANT
DRSG TELFA 3X8 NADH (GAUZE/BANDAGES/DRESSINGS) IMPLANT
DURAPREP 26ML APPLICATOR (WOUND CARE) ×3 IMPLANT
DURASEAL SPINE SEALANT 3ML (MISCELLANEOUS) IMPLANT
ELECT BLADE 4.0 EZ CLEAN MEGAD (MISCELLANEOUS) ×3
ELECTRODE BLDE 4.0 EZ CLN MEGD (MISCELLANEOUS) IMPLANT
GLOVE BIO SURGEON STRL SZ 6.5 (GLOVE) ×1 IMPLANT
GLOVE BIO SURGEONS STRL SZ 6.5 (GLOVE) ×1
GLOVE BIOGEL PI IND STRL 6.5 (GLOVE) IMPLANT
GLOVE BIOGEL PI IND STRL 7.0 (GLOVE) ×1 IMPLANT
GLOVE BIOGEL PI INDICATOR 6.5 (GLOVE) ×2
GLOVE BIOGEL PI INDICATOR 7.0 (GLOVE) ×4
GLOVE SURG SS PI 6.0 STRL IVOR (GLOVE) ×4 IMPLANT
GLOVE SURG SS PI 7.5 STRL IVOR (GLOVE) ×7 IMPLANT
GLOVE SURG SS PI 8.0 STRL IVOR (GLOVE) ×6 IMPLANT
GOWN STRL REUS W/ TWL LRG LVL3 (GOWN DISPOSABLE) ×1 IMPLANT
GOWN STRL REUS W/ TWL XL LVL3 (GOWN DISPOSABLE) ×1 IMPLANT
GOWN STRL REUS W/TWL LRG LVL3 (GOWN DISPOSABLE) ×12
GOWN STRL REUS W/TWL XL LVL3 (GOWN DISPOSABLE) ×3
IV CATH 14GX2 1/4 (CATHETERS) ×3 IMPLANT
KIT BASIN OR (CUSTOM PROCEDURE TRAY) ×3 IMPLANT
NDL SPNL 18GX3.5 QUINCKE PK (NEEDLE) ×2 IMPLANT
NEEDLE 22X1 1/2 (OR ONLY) (NEEDLE) ×3 IMPLANT
NEEDLE SPNL 18GX3.5 QUINCKE PK (NEEDLE) ×6 IMPLANT
PACK LAMINECTOMY NEURO (CUSTOM PROCEDURE TRAY) ×3 IMPLANT
PAD DRESSING TELFA 3X8 NADH (GAUZE/BANDAGES/DRESSINGS) IMPLANT
PATTIES SURGICAL .75X.75 (GAUZE/BANDAGES/DRESSINGS) IMPLANT
RUBBERBAND STERILE (MISCELLANEOUS) ×6 IMPLANT
SPONGE LAP 4X18 X RAY DECT (DISPOSABLE) IMPLANT
SPONGE SURGIFOAM ABS GEL 100 (HEMOSTASIS) ×3 IMPLANT
STAPLER VISISTAT (STAPLE) ×2 IMPLANT
STRIP CLOSURE SKIN 1/2X4 (GAUZE/BANDAGES/DRESSINGS) ×1 IMPLANT
SUT NURALON 4 0 TR CR/8 (SUTURE) IMPLANT
SUT PROLENE 3 0 PS 2 (SUTURE) ×2 IMPLANT
SUT VIC AB 1 CT1 27 (SUTURE)
SUT VIC AB 1 CT1 27XBRD ANTBC (SUTURE) IMPLANT
SUT VIC AB 1 CTB1 27 (SUTURE) ×6 IMPLANT
SUT VIC AB 1-0 CT2 27 (SUTURE) IMPLANT
SUT VIC AB 2-0 CT1 27 (SUTURE) ×3
SUT VIC AB 2-0 CT1 TAPERPNT 27 (SUTURE) IMPLANT
SUT VIC AB 2-0 CT2 27 (SUTURE) ×3 IMPLANT
SYR 3ML LL SCALE MARK (SYRINGE) ×3 IMPLANT
TOWEL GREEN STERILE (TOWEL DISPOSABLE) ×3 IMPLANT
TOWEL GREEN STERILE FF (TOWEL DISPOSABLE) ×3 IMPLANT
YANKAUER SUCT BULB TIP NO VENT (SUCTIONS) ×3 IMPLANT

## 2017-11-23 NOTE — Discharge Instructions (Signed)

## 2017-11-23 NOTE — Transfer of Care (Signed)
Immediate Anesthesia Transfer of Care Note  Patient: Linda Watson  Procedure(s) Performed: Microlumbar decompression Lumbar Five-Sacral One left, Hemilaminectomy Lumbar five. (Left Spine Lumbar)  Patient Location: PACU  Anesthesia Type:General  Level of Consciousness: awake, alert  and oriented  Airway & Oxygen Therapy: Patient Spontanous Breathing and Patient connected to nasal cannula oxygen  Post-op Assessment: Report given to RN and Post -op Vital signs reviewed and stable  Post vital signs: Reviewed and stable  Last Vitals:  Vitals Value Taken Time  BP 125/91 11/23/2017  1:16 PM  Temp    Pulse 93 11/23/2017  1:16 PM  Resp 17 11/23/2017  1:16 PM  SpO2 93 % 11/23/2017  1:16 PM  Vitals shown include unvalidated device data.  Last Pain:  Vitals:   11/23/17 0829  TempSrc: Oral         Complications: No apparent anesthesia complications

## 2017-11-23 NOTE — Interval H&P Note (Signed)
History and Physical Interval Note:  11/23/2017 10:51 AM  [name]  has presented today for surgery, with the diagnosis of HNP stenosis L5-S1 left  The various methods of treatment have been discussed with the patient and family. After consideration of risks, benefits and other options for treatment, the patient has consented to  Procedure(s) with comments: Microlumbar decompression L5-S1 left, possible L4-5 (Left) - 120 mins as a surgical intervention .  The patient's history has been reviewed, patient examined, no change in status, stable for surgery.  I have reviewed the patient's chart and labs.  Questions were answered to the patient's satisfaction.     Linda Watson C

## 2017-11-23 NOTE — Anesthesia Preprocedure Evaluation (Signed)
Anesthesia Evaluation  Patient identified by MRN, date of birth, ID band Patient awake    Reviewed: Allergy & Precautions, NPO status , Patient's Chart, lab work & pertinent test results  Airway Mallampati: II  TM Distance: >3 FB Neck ROM: Full    Dental  (+) Teeth Intact, Dental Advisory Given   Pulmonary    breath sounds clear to auscultation       Cardiovascular  Rhythm:Regular Rate:Normal     Neuro/Psych    GI/Hepatic   Endo/Other    Renal/GU      Musculoskeletal   Abdominal   Peds  Hematology   Anesthesia Other Findings   Reproductive/Obstetrics                             Anesthesia Physical Anesthesia Plan  ASA: III  Anesthesia Plan: General   Post-op Pain Management:    Induction:   PONV Risk Score and Plan: 1 and Ondansetron, Dexamethasone and Scopolamine patch - Pre-op  Airway Management Planned: Oral ETT  Additional Equipment:   Intra-op Plan:   Post-operative Plan: Extubation in OR  Informed Consent: I have reviewed the patients History and Physical, chart, labs and discussed the procedure including the risks, benefits and alternatives for the proposed anesthesia with the patient or authorized representative who has indicated his/her understanding and acceptance.   Dental advisory given  Plan Discussed with: Anesthesiologist and CRNA  Anesthesia Plan Comments:         Anesthesia Quick Evaluation

## 2017-11-23 NOTE — Anesthesia Postprocedure Evaluation (Signed)
Anesthesia Post Note  Patient: Linda Watson  Procedure(s) Performed: Microlumbar decompression Lumbar Five-Sacral One left, Hemilaminectomy Lumbar five. (Left Spine Lumbar)     Patient location during evaluation: PACU Anesthesia Type: General Level of consciousness: awake and alert Pain management: pain level controlled Vital Signs Assessment: post-procedure vital signs reviewed and stable Respiratory status: spontaneous breathing, nonlabored ventilation, respiratory function stable and patient connected to nasal cannula oxygen Cardiovascular status: blood pressure returned to baseline and stable Postop Assessment: no apparent nausea or vomiting Anesthetic complications: no    Last Vitals:  Vitals:   11/23/17 1316 11/23/17 1330  BP: (!) 125/91 120/90  Pulse: 93 83  Resp: 17 (!) 24  Temp:    SpO2: 93% 98%    Last Pain:  Vitals:   11/23/17 1313  TempSrc:   PainSc: 0-No pain                 Quinlan Mcfall COKER

## 2017-11-23 NOTE — Brief Op Note (Signed)
11/23/2017  12:57 PM  PATIENT:  Linda Watson  82 y.o. female  PRE-OPERATIVE DIAGNOSIS:  HNP stenosis L5-S1 left  POST-OPERATIVE DIAGNOSIS:  Herniated Nucleous Pulposus, Stenosis Lumbar Five-Sacral One Left  PROCEDURE:  Procedure(s): Microlumbar decompression Lumbar Five-Sacral One left, Hemilaminectomy Lumbar five. (Left)  SURGEON:  Surgeon(s) and Role:    * Susa Day, MD - Primary  PHYSICIAN ASSISTANT:   ASSISTANTS: Bissell   ANESTHESIA:   general  EBL:  50 mL   BLOOD ADMINISTERED:none  DRAINS: none   LOCAL MEDICATIONS USED:  MARCAINE     SPECIMEN:  No Specimen  DISPOSITION OF SPECIMEN:  N/A  COUNTS:  YES  TOURNIQUET:  * No tourniquets in log *  DICTATION: .Other Dictation: Dictation Number [id number]  PLAN OF CARE: Admit for overnight observation  PATIENT DISPOSITION:  PACU - hemodynamically stable.   Delay start of Pharmacological VTE agent (>24hrs) due to surgical blood loss or risk of bleeding: yes

## 2017-11-23 NOTE — Anesthesia Procedure Notes (Signed)
Procedure Name: Intubation Date/Time: 11/23/2017 11:13 AM Performed by: Wilburn Cornelia, CRNA Pre-anesthesia Checklist: Patient identified, Emergency Drugs available, Suction available, Patient being monitored and Timeout performed Patient Re-evaluated:Patient Re-evaluated prior to induction Oxygen Delivery Method: Circle system utilized Preoxygenation: Pre-oxygenation with 100% oxygen Induction Type: IV induction Ventilation: Mask ventilation without difficulty Laryngoscope Size: Mac and 3 Grade View: Grade IV Tube type: Oral Tube size: 7.0 mm Number of attempts: 2 Airway Equipment and Method: Bougie stylet Placement Confirmation: ETT inserted through vocal cords under direct vision,  positive ETCO2,  CO2 detector and breath sounds checked- equal and bilateral Secured at: 21 cm Tube secured with: Tape Dental Injury: Teeth and Oropharynx as per pre-operative assessment  Difficulty Due To: Difficulty was anticipated and Difficult Airway- due to anterior larynx

## 2017-11-23 NOTE — Evaluation (Signed)
Physical Therapy Evaluation Patient Details [name] MRN: [id number] DOB: Aug 19, 1936 Today's Date: 11/23/2017   History of Present Illness  Pt is a 82 y.o. female now s/p L5-S1 decompression and L5 hemilaminectomy on 11/23/17. PMH includes R TKA (03/2017), back sx, L shoulder sx.    Clinical Impression  Patient evaluated by Physical Therapy with no further acute PT needs identified. PTA, pt indep with all mobility. Son plans to stay with pt at d/c to provide initial support. Educ on back precautions, fall risk reduction, and importance of continued mobility. Today, pt indep with amb and mod indep with stairs. All education has been completed and the patient has no further questions. PT is signing off. Thank you for this referral.    Follow Up Recommendations No PT follow up;Supervision - Intermittent    Equipment Recommendations  None recommended by PT    Recommendations for Other Services       Precautions / Restrictions Precautions Precautions: Back Precaution Booklet Issued: Yes (comment) Precaution Comments: Verbally reviewed precautions Restrictions Weight Bearing Restrictions: No      Mobility  Bed Mobility Overal bed mobility: Independent             General bed mobility comments: Indep with log roll technique  Transfers Overall transfer level: Independent Equipment used: None                Ambulation/Gait Ambulation/Gait assistance: Independent Ambulation Distance (Feet): 450 Feet Assistive device: None Gait Pattern/deviations: Step-through pattern;Decreased stride length   Gait velocity interpretation: Below normal speed for age/gender    Stairs Stairs: Yes Stairs assistance: Modified independent (Device/Increase time) Stair Management: One rail Right;Alternating pattern;Forwards Number of Stairs: 10    Wheelchair Mobility    Modified Rankin (Stroke Patients Only)       Balance Overall balance assessment: Needs  assistance   Sitting balance-Leahy Scale: Good       Standing balance-Leahy Scale: Good                               Pertinent Vitals/Pain Pain Assessment: 0-10 Pain Score: 1  Pain Location: Lumbar incision Pain Descriptors / Indicators: Sore Pain Intervention(s): Monitored during session    Home Living Family/patient expects to be discharged to:: Private residence Living Arrangements: Alone Available Help at Discharge: Family Type of Home: Apartment Home Access: Stairs to enter Entrance Stairs-Rails: Right;Left;Can reach both Technical brewer of Steps: 9 Home Layout: One level Home Equipment: None Additional Comments: Son plays to stay with pt 1-2 nights at d/c    Prior Function Level of Independence: Independent               Hand Dominance        Extremity/Trunk Assessment   Upper Extremity Assessment Upper Extremity Assessment: Overall WFL for tasks assessed    Lower Extremity Assessment Lower Extremity Assessment: Overall WFL for tasks assessed    Cervical / Trunk Assessment Cervical / Trunk Assessment: Normal  Communication   Communication: No difficulties  Cognition Arousal/Alertness: Awake/alert Behavior During Therapy: WFL for tasks assessed/performed Overall Cognitive Status: Within Functional Limits for tasks assessed                                        General Comments      Exercises     Assessment/Plan  PT Assessment Patent does not need any further PT services  PT Problem List         PT Treatment Interventions      PT Goals (Current goals can be found in the Care Plan section)  Acute Rehab PT Goals PT Goal Formulation: All assessment and education complete, DC therapy    Frequency     Barriers to discharge        Co-evaluation               AM-PAC PT "6 Clicks" Daily Activity  Outcome Measure Difficulty turning over in bed (including adjusting bedclothes, sheets and  blankets)?: None Difficulty moving from lying on back to sitting on the side of the bed? : None Difficulty sitting down on and standing up from a chair with arms (e.g., wheelchair, bedside commode, etc,.)?: None Help needed moving to and from a bed to chair (including a wheelchair)?: None Help needed walking in hospital room?: None Help needed climbing 3-5 steps with a railing? : None 6 Click Score: 24    End of Session Equipment Utilized During Treatment: Gait belt Activity Tolerance: Patient tolerated treatment well Patient left: in bed;with call bell/phone within reach Nurse Communication: Mobility status PT Visit Diagnosis: Other abnormalities of gait and mobility (R26.89)    Time: 1643-1700 PT Time Calculation (min) (ACUTE ONLY): 17 min   Charges:   PT Evaluation $PT Eval Low Complexity: 1 Low     PT G Codes:       Mabeline Caras, PT, DPT Acute Rehab Services  Pager: [place] 11/23/2017, 5:37 PM

## 2017-11-23 NOTE — Plan of Care (Signed)
  Problem: Activity: Goal: Ability to avoid complications of mobility impairment will improve Outcome: Progressing Goal: Ability to tolerate increased activity will improve Outcome: Progressing Goal: Will remain free from falls Outcome: Progressing   Problem: Education: Goal: Ability to verbalize activity precautions or restrictions will improve Outcome: Progressing Goal: Knowledge of the prescribed therapeutic regimen will improve Outcome: Progressing Goal: Understanding of discharge needs will improve Outcome: Progressing   Problem: Physical Regulation: Goal: Ability to maintain clinical measurements within normal limits will improve Outcome: Progressing Goal: Postoperative complications will be avoided or minimized Outcome: Progressing Goal: Diagnostic test results will improve Outcome: Progressing   Problem: Pain Management: Goal: Pain level will decrease Outcome: Progressing   Problem: Health Behavior/Discharge Planning: Goal: Identification of resources available to assist in meeting health care needs will improve Outcome: Progressing   

## 2017-11-24 ENCOUNTER — Encounter (HOSPITAL_COMMUNITY): Payer: Self-pay | Admitting: Specialist

## 2017-11-24 DIAGNOSIS — M4807 Spinal stenosis, lumbosacral region: Secondary | ICD-10-CM | POA: Diagnosis not present

## 2017-11-24 LAB — BASIC METABOLIC PANEL
ANION GAP: 8 (ref 5–15)
BUN: 14 mg/dL (ref 6–20)
CHLORIDE: 100 mmol/L — AB (ref 101–111)
CO2: 26 mmol/L (ref 22–32)
CREATININE: 0.84 mg/dL (ref 0.44–1.00)
Calcium: 8.6 mg/dL — ABNORMAL LOW (ref 8.9–10.3)
GFR calc non Af Amer: >60 mL/min (ref >60)
Glucose, Bld: 95 mg/dL (ref 65–99)
POTASSIUM: 4 mmol/L (ref 3.5–5.1)
SODIUM: 134 mmol/L — AB (ref 135–145)

## 2017-11-24 MED ORDER — OXYCODONE HCL 5 MG PO TABS: 5.0000 mg | ORAL_TABLET | Freq: Four times a day (QID) | ORAL | 0 refills | Status: DC | PRN

## 2017-11-24 MED ORDER — DOCUSATE SODIUM 100 MG PO CAPS: 100.0000 mg | ORAL_CAPSULE | Freq: Two times a day (BID) | ORAL | 1 refills | Status: DC

## 2017-11-24 MED ORDER — MELOXICAM 15 MG PO TABS: 15.0000 mg | ORAL_TABLET | Freq: Every day | ORAL | Status: DC

## 2017-11-24 MED ORDER — POLYETHYLENE GLYCOL 3350 17 G PO PACK: 17.0000 g | PACK | Freq: Every day | ORAL | 0 refills | Status: DC | PRN

## 2017-11-24 MED FILL — Thrombin For Soln 20000 Unit: CUTANEOUS | Qty: 1 | Status: AC

## 2017-11-24 NOTE — Progress Notes (Signed)
Subjective: 1 Day Post-Op Procedure(s) (LRB): Microlumbar decompression Lumbar Five-Sacral One left, Hemilaminectomy Lumbar five. (Left) Patient reports pain as mild.   Reports incisional pain is mild and well controlled with tylenol. No other c/o. No N/V. No difficulty voiding. Ambulating well. No leg pain. Wants to go home.  Objective: Vital signs in last 24 hours: Temp:  [97.8 F (36.6 C)-98.7 F (37.1 C)] 98.4 F (36.9 C) (03/29 0420) Pulse Rate:  [69-93] 69 (03/29 0420) Resp:  [11-24] 16 (03/29 0420) BP: (102-137)/(49-91) 108/63 (03/29 0420) SpO2:  [91 %-100 %] 100 % (03/29 0420)  Intake/Output from previous day: 03/28 0701 - 03/29 0700 In: 1000 [I.V.:1000] Out: 50 [Blood:50] Intake/Output this shift: No intake/output data recorded.  Recent Labs    11/21/17 1026  HGB 12.5   Recent Labs    11/21/17 1026  WBC 6.1  RBC 4.16  HCT 39.0  PLT 267   Recent Labs    11/21/17 1026 11/24/17 0617  NA 138 134*  K 4.4 4.0  CL 106 100*  CO2 25 26  BUN 22* 14  CREATININE 0.78 0.84  GLUCOSE 94 95  CALCIUM 8.8* 8.6*   No results for input(s): LABPT, INR in the last 72 hours.  Neurologically intact ABD soft Neurovascular intact Sensation intact distally Intact pulses distally Dorsiflexion/Plantar flexion intact Incision: dressing C/D/I and no drainage No cellulitis present Compartment soft no calf pain or sign of DVT  Assessment/Plan: 1 Day Post-Op Procedure(s) (LRB): Microlumbar decompression Lumbar Five-Sacral One left, Hemilaminectomy Lumbar five. (Left) Advance diet Up with therapy D/C IV fluids  Discussed Lspine precautions, dressing instructions, D/C instructions D/C home today Follow up in office in 2 weeks for staple removal  BISSELL, JACLYN M. 11/24/2017, 7:47 AM

## 2017-11-24 NOTE — Progress Notes (Signed)
Patient alert and oriented, mae's well, voiding adequate amount of urine, swallowing without difficulty, no c/o pain at time of discharge. Patient discharged home with family. Script and discharged instructions given to patient. Patient and family stated understanding of instructions given. Patient has an appointment with Dr. Beane ?

## 2017-11-24 NOTE — Op Note (Signed)
NAME:  JALAYSIA, LOBB NO.:  MEDICAL RECORD NO.:  [id number]  LOCATION:                                 FACILITY:  PHYSICIAN:  Susa Day, M.D.         DATE OF BIRTH:  DATE OF PROCEDURE:  11/23/2017 DATE OF DISCHARGE:                              OPERATIVE REPORT   PREOPERATIVE DIAGNOSIS:  Spinal stenosis, L5-S1.  POSTOPERATIVE DIAGNOSIS:  Spinal stenosis, L5-S1, L4-5.  PROCEDURE PERFORMED: 1. Revision microlumbar decompression, L5, S1. 2. Hemilaminectomy, L5, left. 3. Decompression, L4-5, left.  ANESTHESIA:  General.  ASSISTANT:  Lacie Draft, PA.  INDICATIONS:  This is a 82 year old female with history of lumbar decompression at L4-5, 5-1.  Had severe S1 radicular pain secondary to facet hypertrophy and stenosis laterally, refractory to conservative treatment including a selective nerve root block.  She had a significant scoliosis with a facet hypertrophy compressing mainly the S1 nerve root into the lateral recess.  She was indicated for decompression.  She had a history of lumbar decompression in the past.  Risks and benefits were discussed including bleeding, infection, damage to the neurovascular, no change in symptoms, worsening symptoms, DVT, PE, anesthetic complications, etc.  TECHNIQUE:  With the patient in supine position after induction of adequate general anesthesia and 2 g of Kefzol, placed prone on the Wilson frame.  All bony prominences were well padded.  Lumbar region was prepped and draped in usual sterile fashion.  Two 18-gauge spinal needles were utilized to localize 5-1 interspace, confirmed with x-ray. Increased difficulty due to lumbosacral angle.  Incision was made from the spinous process of L4-S1.  Subcutaneous tissue was dissected. Electrocautery was utilized to achieve hemostasis.  Dorsolumbar fascia was divided in line of skin incision.  Paraspinous muscle elevated from lamina of 4-5, 5-1.  Operating microscope  draped and brought onto the surgical field after confirmatory radiograph obtained.  There was near absence of the interlaminar window at L5-S1 due to the facet hypertrophy, the scoliosis, and the collapse to the lamina.  We utilized straight curette to detach the previous scar tissue from the cephalad edge of S1 and the caudad edge of 5.  We identified the 5-1 facet on the left.  We used a 2-mm Kerrison to begin hemilaminectomy at L5.  This was removed in its entirety.  Then, there was significant shingling of the S1 lamina.  With the neural elements well protected, I performed a generous foraminotomy of S1 and hemilaminotomy of S1 on the left.  There was severe compression of the S1 nerve root noted laterally into the lateral recess.  The nerve root was gently mobilized medially. Decompressed the lateral recess to the medial border of the pedicle. Foraminotomies of L5 and S1 were performed.  The facet and lamina were partially removed at L4-5 as well.  No evidence of disk herniation. Bipolar electrocautery was utilized to achieve hemostasis.  Obtained confirmatory radiograph with a radiologic marker at S1.  No destabilization occurred.  The probe passed freely into the foramen of 4 and into the pedicle of 4.  Good restoration of thecal sac.  Copious irrigation with antibiotic irrigation.  Bone wax placed on the cancellous surfaces.  Thrombin-soaked Gelfoam was placed in laminotomy defect.  We removed the [facility] retractor.  No active bleeding. Copious irrigation.  Dorsolumbar fascia reapproximated with 1 Vicryl, subcu with 2-0, and skin with Prolene.  Sterile dressing applied. Placed supine on the hospital bed, extubated without difficulty and transported to the recovery room in satisfactory condition.  The patient tolerated the procedure well.  No complications.  Assistant, Lacie Draft, [place].  Blood loss was 50 mL.     Susa Day, M.D.     Geralynn Rile  D:  11/23/2017  T:   11/24/2017  Job:  [id number]

## 2017-11-24 NOTE — Discharge Summary (Signed)
Physician Discharge Summary   Patient ID: Linda Watson MRN: [id number] DOB/AGE: October 14, 1935 82 y.o.  Admit date: 11/23/2017 Discharge date: 11/24/2017  Primary Diagnosis:   HNP stenosis L5-S1 left  Admission Diagnoses:  Past Medical History:  Diagnosis Date  . Arthritis    RIGHT KNEE PAIN AND ARTHRITIS  . Bee sting    left anterior hand, states she was stung yesterday 03-16-17, only c/o mild pain, apparent redness and edema observed; denies any severe allergy to been venom (ie no tongue swelling or throat closing); RN encouraged pt to see her primary to r/o worsening or s/s of infection ; patient agreeable to this   . Fracture, patella    01/21/12 - immobilizer x one month   . GERD (gastroesophageal reflux disease)   . Hemorrhoids   . History of hiatal hernia   . History of shingles   . HNP (herniated nucleus pulposus), lumbar   . Lichen planus    LESIONS  IN MOUTH AND OCCASIONALLY GENITAL AREA  . Lumbar stenosis    L5-S1  . PONV (postoperative nausea and vomiting)   . Seasonal allergies   . Spinal stenosis    PAIN IN LOWER BACK AND DOWN LEFT LEG -ALSO HAS NUMBNESS AND TINGLING IN LEFT LEG TO FOOT   Discharge Diagnoses:   Principal Problem:   Lumbar spinal stenosis Active Problems:   Spinal stenosis, lumbar  Procedure:  Procedure(s) (LRB): Microlumbar decompression Lumbar Five-Sacral One left, Hemilaminectomy Lumbar five. (Left)   Consults: None  HPI:  see pre-op H&P    Laboratory Data: Hospital Outpatient Visit on 11/21/2017  Component Date Value Ref Range Status  . Sodium 11/21/2017 138  135 - 145 mmol/L Final  . Potassium 11/21/2017 4.4  3.5 - 5.1 mmol/L Final  . Chloride 11/21/2017 106  101 - 111 mmol/L Final  . CO2 11/21/2017 25  22 - 32 mmol/L Final  . Glucose, Bld 11/21/2017 94  65 - 99 mg/dL Final  . BUN 11/21/2017 22* 6 - 20 mg/dL Final  . Creatinine, Ser 11/21/2017 0.78  0.44 - 1.00 mg/dL Final  . Calcium 11/21/2017 8.8* 8.9 - 10.3 mg/dL Final  .  GFR calc non Af Amer 11/21/2017 >60  >60 mL/min Final  . GFR calc Af Amer 11/21/2017 >60  >60 mL/min Final   Comment: (NOTE) The eGFR has been calculated using the CKD EPI equation. This calculation has not been validated in all clinical situations. eGFR's persistently <60 mL/min signify possible Chronic Kidney Disease.   Georgiann Hahn gap 11/21/2017 7  5 - 15 Final   Performed at [facility], [place] [address] [place], [place] [address]  . WBC 11/21/2017 6.1  4.0 - 10.5 K/uL Final  . RBC 11/21/2017 4.16  3.87 - 5.11 MIL/uL Final  . Hemoglobin 11/21/2017 12.5  12.0 - 15.0 g/dL Final  . HCT 11/21/2017 39.0  36.0 - 46.0 % Final  . MCV 11/21/2017 93.8  78.0 - 100.0 fL Final  . MCH 11/21/2017 30.0  26.0 - 34.0 pg Final  . MCHC 11/21/2017 32.1  30.0 - 36.0 g/dL Final  . RDW 11/21/2017 14.8  11.5 - 15.5 % Final  . Platelets 11/21/2017 267  150 - 400 K/uL Final   Performed at [facility], [place] [address], [place], [place] [address]  . MRSA, PCR 11/21/2017 NEGATIVE  NEGATIVE Final  . Staphylococcus aureus 11/21/2017 NEGATIVE  NEGATIVE Final   Comment: (NOTE) The Xpert SA Assay (FDA approved for NASAL specimens  in patients 13 years of age and older), is one component of a comprehensive surveillance program. It is not intended to diagnose infection nor to guide or monitor treatment. Performed at [facility], [place] [address] [place],  [address]    Recent Labs    11/21/17 1026  HGB 12.5   Recent Labs    11/21/17 1026  WBC 6.1  RBC 4.16  HCT 39.0  PLT 267   Recent Labs    11/21/17 1026 11/24/17 0617  NA 138 134*  K 4.4 4.0  CL 106 100*  CO2 25 26  BUN 22* 14  CREATININE 0.78 0.84  GLUCOSE 94 95  CALCIUM 8.8* 8.6*   No results for input(s): LABPT, INR in the last 72 hours.  X-Rays:Dg Chest 2 View  Result Date: 11/21/2017 CLINICAL DATA:  Preoperative exam for lumbar surgery. EXAM: CHEST - 2 VIEW COMPARISON:  Chest x-ray 03/15/2012. FINDINGS:  Mediastinum and hilar structures normal. Heart size normal. No focal infiltrate. No pleural effusion or pneumothorax. Thoracic spine scoliosis. No acute bony abnormality. Surgical clips upper abdomen. IMPRESSION: No acute cardiopulmonary disease. Electronically Signed   By: Marcello Moores  Register   On: 11/21/2017 15:49   Dg Lumbar Spine 2-3 Views  Result Date: 11/21/2017 CLINICAL DATA:  Back pain with radiation down left leg. EXAM: LUMBAR SPINE - 2-3 VIEW COMPARISON:  03/21/2012. FINDINGS: Lumbar spine numbered as per prior exam. Prominent thoracolumbar spine scoliosis. Diffuse osteopenia. Multilevel degenerative change again noted. Stable mild L1 compression. No acute bony abnormality. IMPRESSION: 1. Diffuse multilevel degenerative change again noted. Stable L1 mild compression fracture. No acute bony abnormality. 2.  Diffuse osteopenia.  Severe thoracolumbar spine scoliosis. Electronically Signed   By: Marcello Moores  Register   On: 11/21/2017 15:52   Dg Lumbar Spine Complete  Result Date: 11/23/2017 CLINICAL DATA:  Localizer for L5-S1 decompression. EXAM: LUMBAR SPINE - COMPLETE 4+ VIEW COMPARISON:  None. FINDINGS: Four intraoperative LATERAL views of the lumbar spine are submitted postoperatively for interpretation. Film 1 demonstrates 2 posterior metallic probe directed at the L5 vertebral body and S1 vertebral body. Film 2 demonstrates a posterior metallic probe overlying the L4 spinous process directed at the L4-5 disc space Film 3 demonstrates 2 metallic probes directed at the L5-S1 disc space. Film 4 demonstrates a posterior metallic probe overlying the posterior aspect of the L5-S1 disc space IMPRESSION: Localizers as described. Electronically Signed   By: Margarette Canada M.D.   On: 11/23/2017 13:37    EKG:No orders found for this or any previous visit.   Hospital Course: Patient was admitted to [facility] and taken to the OR and underwent the above state procedure without complications.  Patient  tolerated the procedure well and was later transferred to the recovery room and then to the orthopaedic floor for postoperative care.  They were given PO and IV analgesics for pain control following their surgery.  They were given 24 hours of postoperative antibiotics.   PT was consulted postop to assist with mobility and transfers.  The patient was allowed to be WBAT with therapy and was taught back precautions. Discharge planning was consulted to help with postop disposition and equipment needs.  Patient had a good night on the evening of surgery and started to get up OOB with therapy on day one. Patient was seen in rounds and was ready to go home on day one.  They were given discharge instructions and dressing directions.  They were instructed on when to follow up  in the office with Dr. Tonita Cong.   Diet: Regular diet Activity:WBAT Follow-up:in 10-14 days Disposition - Home Discharged Condition: good   Discharge Instructions    Call MD / Call 911   Complete by:  As directed    If you experience chest pain or shortness of breath, CALL 911 and be transported to the hospital emergency room.  If you develope a fever above 101 F, pus (white drainage) or increased drainage or redness at the wound, or calf pain, call your surgeon's office.   Constipation Prevention   Complete by:  As directed    Drink plenty of fluids.  Prune juice may be helpful.  You may use a stool softener, such as Colace (over the counter) 100 mg twice a day.  Use MiraLax (over the counter) for constipation as needed.   Diet - low sodium heart healthy   Complete by:  As directed    Increase activity slowly as tolerated   Complete by:  As directed      Allergies as of 11/24/2017      Reactions   Codeine Nausea Only, Other (See Comments)   Makes pt crazy       Medication List    STOP taking these medications   polyethylene glycol powder powder Commonly known as:  GLYCOLAX/MIRALAX Replaced by:  polyethylene glycol packet      TAKE these medications   acetaminophen 500 MG tablet Commonly known as:  TYLENOL Take 1,000 mg by mouth at bedtime.   acetaminophen 650 MG CR tablet Commonly known as:  TYLENOL Take 650 mg by mouth 2 (two) times daily as needed (for pain.). Scheduled at bedtime   Biotin 1000 MCG tablet Take 1,000 mcg by mouth daily.   cetirizine 10 MG tablet Commonly known as:  ZYRTEC Take 10 mg by mouth daily.   docusate sodium 100 MG capsule Commonly known as:  COLACE Take 1 capsule (100 mg total) by mouth 2 (two) times daily.   Magnesium 500 MG Tabs Take 500 mg by mouth at bedtime.   meloxicam 15 MG tablet Commonly known as:  MOBIC Take 1 tablet (15 mg total) by mouth daily. May resume 5 days post-op What changed:  additional instructions   methotrexate 2.5 MG tablet Commonly known as:  RHEUMATREX Take 5 mg by mouth every Saturday.   oxyCODONE 5 MG immediate release tablet Commonly known as:  Oxy IR/ROXICODONE Take 1 tablet (5 mg total) by mouth every 6 (six) hours as needed for moderate pain ((score 4 to 6)).   pantoprazole 40 MG tablet Commonly known as:  PROTONIX Take 40 mg by mouth daily before breakfast.   polyethylene glycol packet Commonly known as:  MIRALAX / GLYCOLAX Take 17 g by mouth daily as needed for mild constipation. Replaces:  polyethylene glycol powder powder   SYSTANE ULTRA OP Place 1 drop into both eyes 3 (three) times daily.   valACYclovir 500 MG tablet Commonly known as:  VALTREX Take 500 mg by mouth daily as needed (for infection on bottom).   vitamin B-12 1000 MCG tablet Commonly known as:  CYANOCOBALAMIN Take 1,000 mcg by mouth daily.      Follow-up Information    Susa Day, MD Follow up in 2 week(s).   Specialty:  Orthopedic Surgery Contact information: [address] [place] [address] [phone]           Signed: Lacie Draft, PA-C Orthopaedic Surgery 11/24/2017, 7:50 AM

## 2017-11-25 NOTE — Progress Notes (Signed)
OT NOTE Late entry  Pt is at adequate level for d/c home and dressed during OT session maintaining back precautions. Pt is eager to have son come pick her up today. Pt with good recall of all precautions. OT to sign off acutely;.   11/24/17 1026  OT Visit Information  Last OT Received On 11/24/17  Assistance Needed +1  History of Present Illness Pt is a 82 y.o. female now s/p L5-S1 decompression and L5 hemilaminectomy on 11/23/17. PMH includes R TKA (03/2017), back sx, L shoulder sx.  Precautions  Precautions Back  Precaution Booklet Issued Yes (comment)  Precaution Comments educated on adls with back precautions and reviwed handout   Restrictions  Weight Bearing Restrictions No  Home Living  Family/patient expects to be discharged to: Private residence  Living Arrangements Alone  Available Help at Discharge Family  Type of [place] to enter  Entrance Stairs-Number of Steps 9  Entrance Stairs-Rails Right;Left;Can reach both  [place] One level  Barrister's clerk None  Additional Comments Son plays to stay with pt 1-2 nights at d/c  Prior Function  Level of Independence Independent  Communication  Communication No difficulties  Pain Assessment  Pain Assessment No/denies pain  Cognition  Arousal/Alertness Awake/alert  Behavior During Therapy WFL for tasks assessed/performed  Overall Cognitive Status Within Functional Limits for tasks assessed  Upper Extremity Assessment  Upper Extremity Assessment Overall WFL for tasks assessed  Lower Extremity Assessment  Lower Extremity Assessment Defer to PT evaluation  Cervical / Trunk Assessment  Cervical / Trunk Assessment  (s/p surg)  ADL  Overall ADL's  Modified independent  General ADL Comments pt dressing during session and maintained all precautions.   Vision- History  Baseline Vision/History No visual deficits  Bed Mobility  Overal bed  mobility Independent  General bed mobility comments Indep with log roll technique  Transfers  Overall transfer level Independent  Equipment used None  Balance  Overall balance assessment Needs assistance  Sitting balance-Leahy Scale Good  Standing balance-Leahy Scale Good  General Comments  General comments (skin integrity, edema, etc.) dressing in tact  OT - End of Session  Activity Tolerance Patient tolerated treatment well  Patient left in bed;with call bell/phone within reach  Nurse Communication Mobility status;Precautions  OT Assessment  OT Recommendation/Assessment Patient does not need any further OT services  OT Visit Diagnosis Unsteadiness on feet (R26.81)  AM-PAC OT "6 Clicks" Daily Activity Outcome Measure  Help from another person eating meals? 4  Help from another person taking care of personal grooming? 4  Help from another person toileting, which includes using toliet, bedpan, or urinal? 4  Help from another person bathing (including washing, rinsing, drying)? 4  Help from another person to put on and taking off regular upper body clothing? 4  Help from another person to put on and taking off regular lower body clothing? 4  6 Click Score 24  ADL G Code Conversion CH  OT Recommendation  Follow Up Recommendations No OT follow up  OT Equipment None recommended by OT  Acute Rehab OT Goals  Patient Stated Goal to go home today  Potential to Achieve Goals Good  OT Time Calculation  OT Start Time (ACUTE ONLY) 1011  OT Stop Time (ACUTE ONLY) 1026  OT Time Calculation (min) 15 min  OT General Charges  $OT Visit 1 Visit  Written Expression  Dominant Hand Right   Back handout  provided and reviewed adls in detail. Pt educated on:set an alarm at night for medication, avoid sitting for long periods of time, correct bed positioning for sleeping, correct sequence for bed mobility, avoiding lifting more than 5 pounds and never wash directly over incision. All education is  complete and patient indicates understanding.   Jeri Modena   OTR/L Pager: [phone] Office: [phone] .

## 2017-12-11 ENCOUNTER — Encounter (HOSPITAL_COMMUNITY): Payer: Self-pay | Admitting: Specialist

## 2017-12-11 NOTE — Addendum Note (Signed)
Addendum  created 12/11/17 1916 by Roberts Gaudy, MD   Intraprocedure Event edited, Intraprocedure Staff edited

## 2017-12-13 DIAGNOSIS — Z4789 Encounter for other orthopedic aftercare: Secondary | ICD-10-CM | POA: Diagnosis not present

## 2017-12-13 DIAGNOSIS — M5416 Radiculopathy, lumbar region: Secondary | ICD-10-CM | POA: Diagnosis not present

## 2017-12-20 DIAGNOSIS — Z4789 Encounter for other orthopedic aftercare: Secondary | ICD-10-CM | POA: Diagnosis not present

## 2017-12-20 DIAGNOSIS — M5416 Radiculopathy, lumbar region: Secondary | ICD-10-CM | POA: Diagnosis not present

## 2017-12-26 DIAGNOSIS — M5416 Radiculopathy, lumbar region: Secondary | ICD-10-CM | POA: Diagnosis not present

## 2017-12-26 DIAGNOSIS — Z4789 Encounter for other orthopedic aftercare: Secondary | ICD-10-CM | POA: Diagnosis not present

## 2018-01-02 DIAGNOSIS — Z4789 Encounter for other orthopedic aftercare: Secondary | ICD-10-CM | POA: Diagnosis not present

## 2018-01-02 DIAGNOSIS — M5416 Radiculopathy, lumbar region: Secondary | ICD-10-CM | POA: Diagnosis not present

## 2018-01-09 DIAGNOSIS — H6992 Unspecified Eustachian tube disorder, left ear: Secondary | ICD-10-CM | POA: Diagnosis not present

## 2018-01-09 DIAGNOSIS — K219 Gastro-esophageal reflux disease without esophagitis: Secondary | ICD-10-CM | POA: Diagnosis not present

## 2018-01-09 DIAGNOSIS — L438 Other lichen planus: Secondary | ICD-10-CM | POA: Diagnosis not present

## 2018-01-09 DIAGNOSIS — Z0000 Encounter for general adult medical examination without abnormal findings: Secondary | ICD-10-CM | POA: Diagnosis not present

## 2018-01-09 DIAGNOSIS — M158 Other polyosteoarthritis: Secondary | ICD-10-CM | POA: Diagnosis not present

## 2018-01-16 DIAGNOSIS — R5383 Other fatigue: Secondary | ICD-10-CM | POA: Diagnosis not present

## 2018-01-16 DIAGNOSIS — L439 Lichen planus, unspecified: Secondary | ICD-10-CM | POA: Diagnosis not present

## 2018-02-02 DIAGNOSIS — M1711 Unilateral primary osteoarthritis, right knee: Secondary | ICD-10-CM | POA: Diagnosis not present

## 2018-02-02 DIAGNOSIS — M1712 Unilateral primary osteoarthritis, left knee: Secondary | ICD-10-CM | POA: Diagnosis not present

## 2018-02-02 DIAGNOSIS — M25562 Pain in left knee: Secondary | ICD-10-CM | POA: Diagnosis not present

## 2018-02-27 DIAGNOSIS — M1712 Unilateral primary osteoarthritis, left knee: Secondary | ICD-10-CM | POA: Diagnosis not present

## 2018-02-27 DIAGNOSIS — M25432 Effusion, left wrist: Secondary | ICD-10-CM | POA: Diagnosis not present

## 2018-02-27 DIAGNOSIS — M25562 Pain in left knee: Secondary | ICD-10-CM | POA: Diagnosis not present

## 2018-03-03 DIAGNOSIS — M25562 Pain in left knee: Secondary | ICD-10-CM | POA: Diagnosis not present

## 2018-03-09 ENCOUNTER — Ambulatory Visit: Payer: Self-pay | Admitting: Orthopedic Surgery

## 2018-03-09 DIAGNOSIS — M25562 Pain in left knee: Secondary | ICD-10-CM | POA: Diagnosis not present

## 2018-03-09 DIAGNOSIS — M48061 Spinal stenosis, lumbar region without neurogenic claudication: Secondary | ICD-10-CM | POA: Diagnosis not present

## 2018-03-09 DIAGNOSIS — M1712 Unilateral primary osteoarthritis, left knee: Secondary | ICD-10-CM | POA: Diagnosis not present

## 2018-04-10 DIAGNOSIS — H722X1 Other marginal perforations of tympanic membrane, right ear: Secondary | ICD-10-CM | POA: Diagnosis not present

## 2018-04-10 DIAGNOSIS — H900 Conductive hearing loss, bilateral: Secondary | ICD-10-CM | POA: Diagnosis not present

## 2018-04-10 DIAGNOSIS — H65493 Other chronic nonsuppurative otitis media, bilateral: Secondary | ICD-10-CM | POA: Diagnosis not present

## 2018-04-10 DIAGNOSIS — H6983 Other specified disorders of Eustachian tube, bilateral: Secondary | ICD-10-CM | POA: Diagnosis not present

## 2018-04-26 ENCOUNTER — Ambulatory Visit: Payer: Self-pay | Admitting: Orthopedic Surgery

## 2018-04-26 NOTE — H&P (Signed)
Linda Watson is an 82 y.o. female.   Chief Complaint: Left knee pain HPI: Linda Watson is here today for her History and Physical. She is scheduled on 05/02/2018 for a left total knee arthroplasty by Dr. Susa Day at [facility].  She reports progressively worsening pain in the left knee, with associated swelling, refractory to injection therapy, anti-inflammatories, pain medications, quad strengthening, relative rest and activity modifications. The symptoms are interfering with her quality-of-life and activities of daily living at this point and she desires to proceed with surgery.  Dr. Tonita Cong and the patient mutually agreed to proceed with a left total knee replacement. Risks and benefits of the procedure were discussed including stiffness, suboptimal range of motion, persistent pain, infection requiring removal of prosthesis and reinsertion, need for prophylactic antibiotics in the future, for example, dental procedures, possible need for manipulation, revision in the future and also anesthetic complications including DVT, PE, etc. We discussed the perioperative course, time in the hospital, postoperative recovery and the need for elevation to control swelling. We also discussed the predicted range of motion and the probability that squatting and kneeling would be unobtainable in the future. In addition, postoperative anticoagulation was discussed. We have obtained preoperative medical clearance as necessary. Provided illustrated handout and discussed it in detail. They will enroll in the total joint replacement educational forum at the hospital.  Her preop appointment at [facility] is scheduled for tomorrow.  Past Medical History:  Diagnosis Date  . Arthritis    RIGHT KNEE PAIN AND ARTHRITIS  . Bee sting    left anterior hand, states she was stung yesterday 03-16-17, only c/o mild pain, apparent redness and edema observed; denies any severe allergy to been venom (ie no tongue  swelling or throat closing); RN encouraged pt to see her primary to r/o worsening or s/s of infection ; patient agreeable to this   . Fracture, patella    01/21/12 - immobilizer x one month   . GERD (gastroesophageal reflux disease)   . Hemorrhoids   . History of hiatal hernia   . History of shingles   . HNP (herniated nucleus pulposus), lumbar   . Lichen planus    LESIONS  IN MOUTH AND OCCASIONALLY GENITAL AREA  . Lumbar stenosis    L5-S1  . PONV (postoperative nausea and vomiting)   . Seasonal allergies   . Spinal stenosis    PAIN IN LOWER BACK AND DOWN LEFT LEG -ALSO HAS NUMBNESS AND TINGLING IN LEFT LEG TO FOOT    Past Surgical History:  Procedure Laterality Date  . ABDOMINAL HYSTERECTOMY    . BACK SURGERY    . BILATERAL KNEE ARTHROSCOPIES    . HERNIA REPAIR    . HIATAL HERNIA REPAIR    . LEFT SHOULDER SURGERY    . LUMBAR LAMINECTOMY/DECOMPRESSION MICRODISCECTOMY Left 11/23/2017   Procedure: Microlumbar decompression Lumbar Five-Sacral One left, Hemilaminectomy Lumbar five.;  Surgeon: Susa Day, MD;  Location: [place];  Service: Orthopedics;  Laterality: Left;  . TOTAL KNEE ARTHROPLASTY Right 04/13/2017   Procedure: RIGHT TOTAL KNEE ARTHROPLASTY;  Surgeon: Susa Day, MD;  Location: [facility];  Service: Orthopedics;  Laterality: Right;  120 mins  Did experience PONV s/p spinal with TKA. Tolerated general anesthesia well with lumbar decompression  No family history on file. Social History:  reports that she has never smoked. She has never used smokeless tobacco. She reports that she does not drink alcohol or use drugs.  Allergies:  Allergies  Allergen  Reactions  . Codeine Nausea Only and Other (See Comments)    Makes pt crazy    Medications: acetaminophen 500 mg tablet biotin 1 mg tablet cephALEXin 500 mg capsule - for dental ppx only cyanocobalamin (vit B-12) 1,000 mcg tablet hydrocortisone 2.5 % topical cream hydrocortisone 20 mg tablet meloxicam 15 mg tablet  (holding for surgery) metHOTREXate sodium 2.5 mg tablet (holding for surgery) pantoprazole 40 mg tablet,delayed release valACYclovir 500 mg tablet  Review of Systems  Constitutional: Negative.   HENT: Negative.   Eyes: Negative.   Respiratory: Negative.   Cardiovascular: Negative.   Gastrointestinal: Negative.   Genitourinary: Negative.   Musculoskeletal: Positive for back pain and joint pain.  Skin: Negative.   Neurological: Negative.   Psychiatric/Behavioral: Negative.     There were no vitals taken for this visit. Physical Exam  Constitutional: She is oriented to person, place, and time. She appears well-developed and well-nourished.  HENT:  Head: Normocephalic.  Eyes: Pupils are equal, round, and reactive to light.  Neck: Normal range of motion.  Cardiovascular: Normal rate.  Respiratory: Effort normal.  GI: Soft.  Musculoskeletal:  Patient is an 82 year old female.  Patient is awake, alert, oriented 3. Well-nourished and well-developed. Antalgic gait with no assistive devices. Slight valgus thrust left knee.  On examination of the knee, tender on palpation of the medial and lateral joint line. Nontender patellar tendon, quadriceps tendon, patella, peroneal nerve and popliteal space. No calf pain or sign of DVT. No pain or laxity with varus or valgus stress. No instability noted. Negative McMurray's. Trace effusion noted. Range of motion 0-100 today. Positive patellofemoral crepitus. No patellofemoral pain on compression. Sensation intact distally.  Neurological: She is alert and oriented to person, place, and time.  Skin: Skin is warm and dry.    Prior x-rays reviewed, end-stage degenerative changes left knee, bone-on-bone laterally with a slight valgus alignment.  Assessment/Plan Impression: Left knee end-stage osteoarthritis refractory to conservative treatment  Plan: Pt with end-stage Left knee DJD, bone-on-bone, refractory to conservative tx, scheduled for Left  total knee replacement by Dr. Tonita Cong on September 4. We again discussed the procedure itself as well as risks, complications and alternatives, including but not limited to DVT, PE, infx, bleeding, failure of procedure, need for secondary procedure including manipulation, nerve injury, ongoing pain/symptoms, anesthesia risk, even stroke or death. Also discussed typical post-op protocols, activity restrictions, need for PT, flexion/extension exercises, time out of work. Discussed need for DVT ppx post-op per protocol. Discussed dental ppx and infx prevention. Also discussed limitations post-operatively such as kneeling and squatting. All questions were answered. Patient desires to proceed with surgery as scheduled.  Will hold supplements, ASA and NSAIDs accordingly. She has also already been holding methotrexate and will continue to do so. Will remain NPO after midnight the night before surgery. Will present to [facility] for pre-op testing. Anticipate hospital stay to include at least 2 midnights given medical history and to ensure proper pain control. Plan ASA BID for DVT ppx post-op. Plan Oxycodone for more severe pain and then tapering down to Tylenol when able, Colace, Miralax. Plan home with HHPT post-op with family members at home for assistance then transition to outpatient PT when ready. She has all of her DME from when we did her knee replacement on the right last year. Will follow up 10-14 days post-op for suture removal and xrays.  Anticipated LOS equal to or greater than 2 midnights due to - Age 24 and older with one or more of  the following:  - Obesity  - Expected need for hospital services (PT, OT, Nursing) required for safe  discharge  - Anticipated need for postoperative skilled nursing care or inpatient rehab   Plan left total knee replacement  Cecilie Kicks., PA-C for Dr. Tonita Cong 04/26/2018, 2:52 PM

## 2018-04-26 NOTE — H&P (View-Only) (Signed)
Linda Watson is an 82 y.o. female.   Chief Complaint: Left knee pain HPI: Linda Watson is here today for her History and Physical. She is scheduled on 05/02/2018 for a left total knee arthroplasty by Dr. Susa Day at [facility].  She reports progressively worsening pain in the left knee, with associated swelling, refractory to injection therapy, anti-inflammatories, pain medications, quad strengthening, relative rest and activity modifications. The symptoms are interfering with her quality-of-life and activities of daily living at this point and she desires to proceed with surgery.  Dr. Tonita Cong and the patient mutually agreed to proceed with a left total knee replacement. Risks and benefits of the procedure were discussed including stiffness, suboptimal range of motion, persistent pain, infection requiring removal of prosthesis and reinsertion, need for prophylactic antibiotics in the future, for example, dental procedures, possible need for manipulation, revision in the future and also anesthetic complications including DVT, PE, etc. We discussed the perioperative course, time in the hospital, postoperative recovery and the need for elevation to control swelling. We also discussed the predicted range of motion and the probability that squatting and kneeling would be unobtainable in the future. In addition, postoperative anticoagulation was discussed. We have obtained preoperative medical clearance as necessary. Provided illustrated handout and discussed it in detail. They will enroll in the total joint replacement educational forum at the hospital.  Her preop appointment at [facility] is scheduled for tomorrow.  Past Medical History:  Diagnosis Date  . Arthritis    RIGHT KNEE PAIN AND ARTHRITIS  . Bee sting    left anterior hand, states she was stung yesterday 03-16-17, only c/o mild pain, apparent redness and edema observed; denies any severe allergy to been venom (ie no tongue  swelling or throat closing); RN encouraged pt to see her primary to r/o worsening or s/s of infection ; patient agreeable to this   . Fracture, patella    01/21/12 - immobilizer x one month   . GERD (gastroesophageal reflux disease)   . Hemorrhoids   . History of hiatal hernia   . History of shingles   . HNP (herniated nucleus pulposus), lumbar   . Lichen planus    LESIONS  IN MOUTH AND OCCASIONALLY GENITAL AREA  . Lumbar stenosis    L5-S1  . PONV (postoperative nausea and vomiting)   . Seasonal allergies   . Spinal stenosis    PAIN IN LOWER BACK AND DOWN LEFT LEG -ALSO HAS NUMBNESS AND TINGLING IN LEFT LEG TO FOOT    Past Surgical History:  Procedure Laterality Date  . ABDOMINAL HYSTERECTOMY    . BACK SURGERY    . BILATERAL KNEE ARTHROSCOPIES    . HERNIA REPAIR    . HIATAL HERNIA REPAIR    . LEFT SHOULDER SURGERY    . LUMBAR LAMINECTOMY/DECOMPRESSION MICRODISCECTOMY Left 11/23/2017   Procedure: Microlumbar decompression Lumbar Five-Sacral One left, Hemilaminectomy Lumbar five.;  Surgeon: Susa Day, MD;  Location: [place];  Service: Orthopedics;  Laterality: Left;  . TOTAL KNEE ARTHROPLASTY Right 04/13/2017   Procedure: RIGHT TOTAL KNEE ARTHROPLASTY;  Surgeon: Susa Day, MD;  Location: [facility];  Service: Orthopedics;  Laterality: Right;  120 mins  Did experience PONV s/p spinal with TKA. Tolerated general anesthesia well with lumbar decompression  No family history on file. Social History:  reports that she has never smoked. She has never used smokeless tobacco. She reports that she does not drink alcohol or use drugs.  Allergies:  Allergies  Allergen  Reactions  . Codeine Nausea Only and Other (See Comments)    Makes pt crazy    Medications: acetaminophen 500 mg tablet biotin 1 mg tablet cephALEXin 500 mg capsule - for dental ppx only cyanocobalamin (vit B-12) 1,000 mcg tablet hydrocortisone 2.5 % topical cream hydrocortisone 20 mg tablet meloxicam 15 mg tablet  (holding for surgery) metHOTREXate sodium 2.5 mg tablet (holding for surgery) pantoprazole 40 mg tablet,delayed release valACYclovir 500 mg tablet  Review of Systems  Constitutional: Negative.   HENT: Negative.   Eyes: Negative.   Respiratory: Negative.   Cardiovascular: Negative.   Gastrointestinal: Negative.   Genitourinary: Negative.   Musculoskeletal: Positive for back pain and joint pain.  Skin: Negative.   Neurological: Negative.   Psychiatric/Behavioral: Negative.     There were no vitals taken for this visit. Physical Exam  Constitutional: She is oriented to person, place, and time. She appears well-developed and well-nourished.  HENT:  Head: Normocephalic.  Eyes: Pupils are equal, round, and reactive to light.  Neck: Normal range of motion.  Cardiovascular: Normal rate.  Respiratory: Effort normal.  GI: Soft.  Musculoskeletal:  Patient is an 82 year old female.  Patient is awake, alert, oriented 3. Well-nourished and well-developed. Antalgic gait with no assistive devices. Slight valgus thrust left knee.  On examination of the knee, tender on palpation of the medial and lateral joint line. Nontender patellar tendon, quadriceps tendon, patella, peroneal nerve and popliteal space. No calf pain or sign of DVT. No pain or laxity with varus or valgus stress. No instability noted. Negative McMurray's. Trace effusion noted. Range of motion 0-100 today. Positive patellofemoral crepitus. No patellofemoral pain on compression. Sensation intact distally.  Neurological: She is alert and oriented to person, place, and time.  Skin: Skin is warm and dry.    Prior x-rays reviewed, end-stage degenerative changes left knee, bone-on-bone laterally with a slight valgus alignment.  Assessment/Plan Impression: Left knee end-stage osteoarthritis refractory to conservative treatment  Plan: Pt with end-stage Left knee DJD, bone-on-bone, refractory to conservative tx, scheduled for Left  total knee replacement by Dr. Tonita Cong on September 4. We again discussed the procedure itself as well as risks, complications and alternatives, including but not limited to DVT, PE, infx, bleeding, failure of procedure, need for secondary procedure including manipulation, nerve injury, ongoing pain/symptoms, anesthesia risk, even stroke or death. Also discussed typical post-op protocols, activity restrictions, need for PT, flexion/extension exercises, time out of work. Discussed need for DVT ppx post-op per protocol. Discussed dental ppx and infx prevention. Also discussed limitations post-operatively such as kneeling and squatting. All questions were answered. Patient desires to proceed with surgery as scheduled.  Will hold supplements, ASA and NSAIDs accordingly. She has also already been holding methotrexate and will continue to do so. Will remain NPO after midnight the night before surgery. Will present to [facility] for pre-op testing. Anticipate hospital stay to include at least 2 midnights given medical history and to ensure proper pain control. Plan ASA BID for DVT ppx post-op. Plan Oxycodone for more severe pain and then tapering down to Tylenol when able, Colace, Miralax. Plan home with HHPT post-op with family members at home for assistance then transition to outpatient PT when ready. She has all of her DME from when we did her knee replacement on the right last year. Will follow up 10-14 days post-op for suture removal and xrays.  Anticipated LOS equal to or greater than 2 midnights due to - Age 80 and older with one or more of  the following:  - Obesity  - Expected need for hospital services (PT, OT, Nursing) required for safe  discharge  - Anticipated need for postoperative skilled nursing care or inpatient rehab   Plan left total knee replacement  Cecilie Kicks., PA-C for Dr. Tonita Cong 04/26/2018, 2:52 PM

## 2018-04-26 NOTE — Progress Notes (Signed)
CXR 11-21-17 Epic

## 2018-04-26 NOTE — Patient Instructions (Addendum)
Linda Watson  04/26/2018   Your procedure is scheduled on: 05-02-18   Report to [facility] Main  Entrance    Report to admitting at 6:30AM    Call this number if you have problems the morning of surgery [phone]     Remember: Do not eat food or drink liquids :After Midnight.     Take these medicines the morning of surgery with A SIP OF WATER: TYLENOL,  PANTOPRAZOLE, SYSTANE EYE DROPS                                  You may not have any metal on your body including hair pins and              piercings  Do not wear jewelry, make-up, lotions, powders or perfumes, deodorant             Do not wear nail polish.  Do not shave  48 hours prior to surgery.         Do not bring valuables to the hospital. [place].  Contacts, dentures or bridgework may not be worn into surgery.  Leave suitcase in the car. After surgery it may be brought to your room.                 Please read over the following fact sheets you were given: _____________________________________________________________________            [facility] - Preparing for Surgery Before surgery, you can play an important role.  Because skin is not sterile, your skin needs to be as free of germs as possible.  You can reduce the number of germs on your skin by washing with CHG (chlorahexidine gluconate) soap before surgery.  CHG is an antiseptic cleaner which kills germs and bonds with the skin to continue killing germs even after washing. Please DO NOT use if you have an allergy to CHG or antibacterial soaps.  If your skin becomes reddened/irritated stop using the CHG and inform your nurse when you arrive at Short Stay. Do not shave (including legs and underarms) for at least 48 hours prior to the first CHG shower.  You may shave your face/neck. Please follow these instructions carefully:  1.  Shower with CHG Soap the night before surgery and the   morning of Surgery.  2.  If you choose to wash your hair, wash your hair first as usual with your  normal  shampoo.  3.  After you shampoo, rinse your hair and body thoroughly to remove the  shampoo.                           4.  Use CHG as you would any other liquid soap.  You can apply chg directly  to the skin and wash                       Gently with a scrungie or clean washcloth.  5.  Apply the CHG Soap to your body ONLY FROM THE NECK DOWN.   Do not use on face/ open  Wound or open sores. Avoid contact with eyes, ears mouth and genitals (private parts).                       Wash face,  Genitals (private parts) with your normal soap.             6.  Wash thoroughly, paying special attention to the area where your surgery  will be performed.  7.  Thoroughly rinse your body with warm water from the neck down.  8.  DO NOT shower/wash with your normal soap after using and rinsing off  the CHG Soap.                9.  Pat yourself dry with a clean towel.            10.  Wear clean pajamas.            11.  Place clean sheets on your bed the night of your first shower and do not  sleep with pets. Day of Surgery : Do not apply any lotions/deodorants the morning of surgery.  Please wear clean clothes to the hospital/surgery center.  FAILURE TO FOLLOW THESE INSTRUCTIONS MAY RESULT IN THE CANCELLATION OF YOUR SURGERY PATIENT SIGNATURE_________________________________  NURSE SIGNATURE__________________________________  ________________________________________________________________________   Linda Watson  An incentive spirometer is a tool that can help keep your lungs clear and active. This tool measures how well you are filling your lungs with each breath. Taking long deep breaths may help reverse or decrease the chance of developing breathing (pulmonary) problems (especially infection) following:  A long period of time when you are unable to move or be  active. BEFORE THE PROCEDURE   If the spirometer includes an indicator to show your best effort, your nurse or respiratory therapist will set it to a desired goal.  If possible, sit up straight or lean slightly forward. Try not to slouch.  Hold the incentive spirometer in an upright position. INSTRUCTIONS FOR USE  1. Sit on the edge of your bed if possible, or sit up as far as you can in bed or on a chair. 2. Hold the incentive spirometer in an upright position. 3. Breathe out normally. 4. Place the mouthpiece in your mouth and seal your lips tightly around it. 5. Breathe in slowly and as deeply as possible, raising the piston or the ball toward the top of the column. 6. Hold your breath for 3-5 seconds or for as long as possible. Allow the piston or ball to fall to the bottom of the column. 7. Remove the mouthpiece from your mouth and breathe out normally. 8. Rest for a few seconds and repeat Steps 1 through 7 at least 10 times every 1-2 hours when you are awake. Take your time and take a few normal breaths between deep breaths. 9. The spirometer may include an indicator to show your best effort. Use the indicator as a goal to work toward during each repetition. 10. After each set of 10 deep breaths, practice coughing to be sure your lungs are clear. If you have an incision (the cut made at the time of surgery), support your incision when coughing by placing a pillow or rolled up towels firmly against it. Once you are able to get out of bed, walk around indoors and cough well. You may stop using the incentive spirometer when instructed by your caregiver.  RISKS AND COMPLICATIONS  Take your time so you do not get  dizzy or light-headed.  If you are in pain, you may need to take or ask for pain medication before doing incentive spirometry. It is harder to take a deep breath if you are having pain. AFTER USE  Rest and breathe slowly and easily.  It can be helpful to keep track of a log of  your progress. Your caregiver can provide you with a simple table to help with this. If you are using the spirometer at home, follow these instructions: [place] IF:   You are having difficultly using the spirometer.  You have trouble using the spirometer as often as instructed.  Your pain medication is not giving enough relief while using the spirometer.  You develop fever of 100.5 F (38.1 C) or higher. SEEK IMMEDIATE MEDICAL CARE IF:   You cough up bloody sputum that had not been present before.  You develop fever of 102 F (38.9 C) or greater.  You develop worsening pain at or near the incision site. MAKE SURE YOU:   Understand these instructions.  Will watch your condition.  Will get help right away if you are not doing well or get worse. Document Released: 12/26/2006 Document Revised: 11/07/2011 Document Reviewed: 02/26/2007 [facility] Patient Information 2014 [place], [place].   ________________________________________________________________________

## 2018-04-27 ENCOUNTER — Other Ambulatory Visit (HOSPITAL_COMMUNITY): Payer: Self-pay | Admitting: *Deleted

## 2018-04-27 ENCOUNTER — Encounter (HOSPITAL_COMMUNITY)
Admission: RE | Admit: 2018-04-27 | Discharge: 2018-04-27 | Disposition: A | Discharge status: Home / Self Care | Payer: PPO | Source: Ambulatory Visit | Attending: Specialist | Admitting: Specialist

## 2018-04-27 ENCOUNTER — Encounter (HOSPITAL_COMMUNITY): Payer: Self-pay

## 2018-04-27 DIAGNOSIS — Z01812 Encounter for preprocedural laboratory examination: Secondary | ICD-10-CM | POA: Diagnosis not present

## 2018-04-27 DIAGNOSIS — M1712 Unilateral primary osteoarthritis, left knee: Secondary | ICD-10-CM | POA: Diagnosis not present

## 2018-04-27 LAB — BASIC METABOLIC PANEL
Anion gap: 10 (ref 5–15)
BUN: 20 mg/dL (ref 8–23)
CO2: 29 mmol/L (ref 22–32)
Calcium: 9.4 mg/dL (ref 8.9–10.3)
Chloride: 102 mmol/L (ref 98–111)
Creatinine, Ser: 0.78 mg/dL (ref 0.44–1.00)
GFR calc Af Amer: >60 mL/min (ref >60)
GLUCOSE: 92 mg/dL (ref 70–99)
POTASSIUM: 4.4 mmol/L (ref 3.5–5.1)
Sodium: 141 mmol/L (ref 135–145)

## 2018-04-27 LAB — CBC
HEMATOCRIT: 41.7 % (ref 36.0–46.0)
Hemoglobin: 13.4 g/dL (ref 12.0–15.0)
MCH: 29.2 pg (ref 26.0–34.0)
MCHC: 32.1 g/dL (ref 30.0–36.0)
MCV: 90.8 fL (ref 78.0–100.0)
Platelets: 302 10*3/uL (ref 150–400)
RBC: 4.59 MIL/uL (ref 3.87–5.11)
RDW: 14.4 % (ref 11.5–15.5)
WBC: 6 10*3/uL (ref 4.0–10.5)

## 2018-04-27 LAB — PROTIME-INR
INR: 0.97
Prothrombin Time: 12.8 seconds (ref 11.4–15.2)

## 2018-04-27 LAB — URINALYSIS, ROUTINE W REFLEX MICROSCOPIC
BACTERIA UA: NONE SEEN
Bilirubin Urine: NEGATIVE
Glucose, UA: NEGATIVE mg/dL
Ketones, ur: NEGATIVE mg/dL
Nitrite: NEGATIVE
Protein, ur: NEGATIVE mg/dL
SPECIFIC GRAVITY, URINE: 1.004 — AB (ref 1.005–1.030)
pH: 6 (ref 5.0–8.0)

## 2018-04-27 LAB — APTT: APTT: 33 s (ref 24–36)

## 2018-04-27 LAB — SURGICAL PCR SCREEN
MRSA, PCR: NEGATIVE
Staphylococcus aureus: NEGATIVE

## 2018-04-27 NOTE — Progress Notes (Signed)
ua results from 04-27-18 lab faxed to dr beane via epic.

## 2018-05-01 NOTE — Anesthesia Preprocedure Evaluation (Addendum)
Anesthesia Evaluation  Patient identified by MRN, date of birth, ID band Patient awake    Reviewed: Allergy & Precautions, H&P , NPO status , Patient's Chart, lab work & pertinent test results  History of Anesthesia Complications (+) PONV and history of anesthetic complications  Airway Mallampati: II  TM Distance: <3 FB Neck ROM: full    Dental  (+) Dental Advisory Given, Missing,    Pulmonary neg pulmonary ROS,    Pulmonary exam normal breath sounds clear to auscultation       Cardiovascular Exercise Tolerance: Good negative cardio ROS Normal cardiovascular exam Rhythm:regular Rate:Normal     Neuro/Psych negative neurological ROS     GI/Hepatic negative GI ROS, Neg liver ROS, GERD  Medicated and Controlled,  Endo/Other  negative endocrine ROS  Renal/GU negative Renal ROS  negative genitourinary   Musculoskeletal   Abdominal   Peds  Hematology negative hematology ROS (+)   Anesthesia Other Findings   Reproductive/Obstetrics                             Lab Results  Component Value Date   WBC 6.0 04/27/2018   HGB 13.4 04/27/2018   HCT 41.7 04/27/2018   MCV 90.8 04/27/2018   PLT 302 04/27/2018   Lab Results  Component Value Date   CREATININE 0.78 04/27/2018   BUN 20 04/27/2018   NA 141 04/27/2018   K 4.4 04/27/2018   CL 102 04/27/2018   CO2 29 04/27/2018    Anesthesia Physical  Anesthesia Plan  ASA: II  Anesthesia Plan: Regional, Spinal and MAC   Post-op Pain Management:  Regional for Post-op pain   Induction:   PONV Risk Score and Plan: 3 and Ondansetron, Propofol infusion and Treatment may vary due to age or medical condition  Airway Management Planned: Natural Airway and Simple Face Mask  Additional Equipment: None  Intra-op Plan:   Post-operative Plan:   Informed Consent: I have reviewed the patients History and Physical, chart, labs and discussed the  procedure including the risks, benefits and alternatives for the proposed anesthesia with the patient or authorized representative who has indicated his/her understanding and acceptance.   Dental advisory given  Plan Discussed with: CRNA and Surgeon  Anesthesia Plan Comments:         Anesthesia Quick Evaluation

## 2018-05-02 ENCOUNTER — Other Ambulatory Visit: Payer: Self-pay

## 2018-05-02 ENCOUNTER — Inpatient Hospital Stay (HOSPITAL_COMMUNITY): Payer: PPO

## 2018-05-02 ENCOUNTER — Encounter (HOSPITAL_COMMUNITY): Payer: Self-pay | Admitting: General Practice

## 2018-05-02 ENCOUNTER — Inpatient Hospital Stay (HOSPITAL_COMMUNITY)
Admission: RE | Admit: 2018-05-02 | Discharge: 2018-05-05 | DRG: 470 | Disposition: A | Discharge status: Home Health | Payer: PPO | Attending: Specialist | Admitting: Specialist

## 2018-05-02 ENCOUNTER — Inpatient Hospital Stay (HOSPITAL_COMMUNITY): Payer: PPO | Admitting: Certified Registered Nurse Anesthetist

## 2018-05-02 ENCOUNTER — Encounter (HOSPITAL_COMMUNITY)
Admission: RE | Disposition: A | Discharge status: Home Health | Payer: Self-pay | Source: Home / Self Care | Attending: Specialist

## 2018-05-02 DIAGNOSIS — M21062 Valgus deformity, not elsewhere classified, left knee: Secondary | ICD-10-CM | POA: Diagnosis not present

## 2018-05-02 DIAGNOSIS — Z79899 Other long term (current) drug therapy: Secondary | ICD-10-CM

## 2018-05-02 DIAGNOSIS — J302 Other seasonal allergic rhinitis: Secondary | ICD-10-CM | POA: Diagnosis present

## 2018-05-02 DIAGNOSIS — M25062 Hemarthrosis, left knee: Secondary | ICD-10-CM | POA: Diagnosis not present

## 2018-05-02 DIAGNOSIS — Z791 Long term (current) use of non-steroidal anti-inflammatories (NSAID): Secondary | ICD-10-CM | POA: Diagnosis not present

## 2018-05-02 DIAGNOSIS — M810 Age-related osteoporosis without current pathological fracture: Secondary | ICD-10-CM | POA: Diagnosis present

## 2018-05-02 DIAGNOSIS — Z471 Aftercare following joint replacement surgery: Secondary | ICD-10-CM | POA: Diagnosis not present

## 2018-05-02 DIAGNOSIS — M25762 Osteophyte, left knee: Secondary | ICD-10-CM | POA: Diagnosis present

## 2018-05-02 DIAGNOSIS — M7989 Other specified soft tissue disorders: Secondary | ICD-10-CM | POA: Diagnosis not present

## 2018-05-02 DIAGNOSIS — G8918 Other acute postprocedural pain: Secondary | ICD-10-CM | POA: Diagnosis not present

## 2018-05-02 DIAGNOSIS — Z96651 Presence of right artificial knee joint: Secondary | ICD-10-CM | POA: Diagnosis not present

## 2018-05-02 DIAGNOSIS — E669 Obesity, unspecified: Secondary | ICD-10-CM | POA: Diagnosis present

## 2018-05-02 DIAGNOSIS — K219 Gastro-esophageal reflux disease without esophagitis: Secondary | ICD-10-CM | POA: Diagnosis not present

## 2018-05-02 DIAGNOSIS — M1712 Unilateral primary osteoarthritis, left knee: Principal | ICD-10-CM | POA: Diagnosis present

## 2018-05-02 DIAGNOSIS — Z8619 Personal history of other infectious and parasitic diseases: Secondary | ICD-10-CM

## 2018-05-02 DIAGNOSIS — Z885 Allergy status to narcotic agent status: Secondary | ICD-10-CM

## 2018-05-02 DIAGNOSIS — Z6824 Body mass index (BMI) 24.0-24.9, adult: Secondary | ICD-10-CM | POA: Diagnosis not present

## 2018-05-02 DIAGNOSIS — Z90710 Acquired absence of both cervix and uterus: Secondary | ICD-10-CM

## 2018-05-02 DIAGNOSIS — Z96659 Presence of unspecified artificial knee joint: Secondary | ICD-10-CM

## 2018-05-02 DIAGNOSIS — Z96652 Presence of left artificial knee joint: Secondary | ICD-10-CM | POA: Diagnosis not present

## 2018-05-02 HISTORY — PX: TOTAL KNEE ARTHROPLASTY: SHX125

## 2018-05-02 SURGERY — ARTHROPLASTY, KNEE, TOTAL
Anesthesia: Monitor Anesthesia Care | Site: Knee | Laterality: Left

## 2018-05-02 MED ORDER — PROPOFOL 10 MG/ML IV BOLUS
INTRAVENOUS | Status: AC
  Filled 2018-05-02: qty 20

## 2018-05-02 MED ORDER — LIDOCAINE 2% (20 MG/ML) 5 ML SYRINGE
INTRAMUSCULAR | Status: DC | PRN
  Administered 2018-05-02: 50 mg via INTRAVENOUS

## 2018-05-02 MED ORDER — BIOTIN 1000 MCG PO TABS: 1000.0000 ug | ORAL_TABLET | Freq: Every day | ORAL | Status: DC

## 2018-05-02 MED ORDER — ROPIVACAINE HCL 7.5 MG/ML IJ SOLN
INTRAMUSCULAR | Status: DC | PRN
  Administered 2018-05-02: 20 mL via PERINEURAL

## 2018-05-02 MED ORDER — METOCLOPRAMIDE HCL 5 MG/ML IJ SOLN: 5.0000 mg | Freq: Three times a day (TID) | INTRAMUSCULAR | Status: DC | PRN

## 2018-05-02 MED ORDER — ACETAMINOPHEN 500 MG PO TABS
1000.0000 mg | ORAL_TABLET | Freq: Four times a day (QID) | ORAL | Status: DC
  Administered 2018-05-02 – 2018-05-05 (×10): 1000 mg via ORAL
  Filled 2018-05-02 (×12): qty 2

## 2018-05-02 MED ORDER — STERILE WATER FOR IRRIGATION IR SOLN
Status: DC | PRN
  Administered 2018-05-02: 2000 mL

## 2018-05-02 MED ORDER — CEFAZOLIN SODIUM-DEXTROSE 2-4 GM/100ML-% IV SOLN
2.0000 g | INTRAVENOUS | Status: AC
  Administered 2018-05-02: 2 g via INTRAVENOUS
  Filled 2018-05-02: qty 100

## 2018-05-02 MED ORDER — BUPIVACAINE-EPINEPHRINE (PF) 0.25% -1:200000 IJ SOLN
INTRAMUSCULAR | Status: AC
  Filled 2018-05-02: qty 60

## 2018-05-02 MED ORDER — PROPOFOL 10 MG/ML IV BOLUS
INTRAVENOUS | Status: AC
  Filled 2018-05-02: qty 40

## 2018-05-02 MED ORDER — POLYETHYLENE GLYCOL 3350 17 G PO PACK
17.0000 g | PACK | Freq: Every day | ORAL | Status: DC
  Administered 2018-05-02 – 2018-05-05 (×4): 17 g via ORAL
  Filled 2018-05-02 (×4): qty 1

## 2018-05-02 MED ORDER — MAGNESIUM OXIDE 400 (241.3 MG) MG PO TABS
200.0000 mg | ORAL_TABLET | Freq: Every day | ORAL | Status: DC
  Administered 2018-05-02 – 2018-05-04 (×3): 200 mg via ORAL
  Filled 2018-05-02 (×3): qty 1

## 2018-05-02 MED ORDER — OXYCODONE HCL 5 MG PO TABS
5.0000 mg | ORAL_TABLET | ORAL | Status: DC | PRN
  Administered 2018-05-02: 5 mg via ORAL
  Administered 2018-05-02 – 2018-05-03 (×5): 10 mg via ORAL
  Filled 2018-05-02 (×5): qty 2

## 2018-05-02 MED ORDER — DEXAMETHASONE SODIUM PHOSPHATE 10 MG/ML IJ SOLN
INTRAMUSCULAR | Status: DC | PRN
  Administered 2018-05-02: 5 mg via INTRAVENOUS

## 2018-05-02 MED ORDER — SODIUM CHLORIDE 0.9 % IV SOLN
INTRAVENOUS | Status: AC
  Filled 2018-05-02: qty 500000

## 2018-05-02 MED ORDER — FENTANYL CITRATE (PF) 100 MCG/2ML IJ SOLN
INTRAMUSCULAR | Status: AC
  Filled 2018-05-02: qty 2

## 2018-05-02 MED ORDER — ASPIRIN EC 81 MG PO TBEC: 81.0000 mg | DELAYED_RELEASE_TABLET | Freq: Two times a day (BID) | ORAL | 2 refills | Status: AC

## 2018-05-02 MED ORDER — LIDOCAINE 2% (20 MG/ML) 5 ML SYRINGE
INTRAMUSCULAR | Status: AC
  Filled 2018-05-02: qty 5

## 2018-05-02 MED ORDER — BISACODYL 5 MG PO TBEC: 5.0000 mg | DELAYED_RELEASE_TABLET | Freq: Every day | ORAL | Status: DC | PRN

## 2018-05-02 MED ORDER — METOCLOPRAMIDE HCL 5 MG PO TABS: 5.0000 mg | ORAL_TABLET | Freq: Three times a day (TID) | ORAL | Status: DC | PRN

## 2018-05-02 MED ORDER — PHENYLEPHRINE 40 MCG/ML (10ML) SYRINGE FOR IV PUSH (FOR BLOOD PRESSURE SUPPORT)
PREFILLED_SYRINGE | INTRAVENOUS | Status: AC
  Filled 2018-05-02: qty 10

## 2018-05-02 MED ORDER — DEXAMETHASONE SODIUM PHOSPHATE 10 MG/ML IJ SOLN
INTRAMUSCULAR | Status: AC
  Filled 2018-05-02: qty 1

## 2018-05-02 MED ORDER — SODIUM CHLORIDE 0.9 % IV SOLN
INTRAVENOUS | Status: DC | PRN
  Administered 2018-05-02: 500 mL

## 2018-05-02 MED ORDER — PHENOL 1.4 % MT LIQD
1.0000 | OROMUCOSAL | Status: DC | PRN
  Filled 2018-05-02: qty 177

## 2018-05-02 MED ORDER — VITAMIN B-12 1000 MCG PO TABS
1000.0000 ug | ORAL_TABLET | Freq: Every day | ORAL | Status: DC
  Administered 2018-05-02 – 2018-05-05 (×4): 1000 ug via ORAL
  Filled 2018-05-02 (×4): qty 1

## 2018-05-02 MED ORDER — ONDANSETRON HCL 4 MG PO TABS
4.0000 mg | ORAL_TABLET | Freq: Four times a day (QID) | ORAL | Status: DC | PRN
  Administered 2018-05-03: 4 mg via ORAL
  Filled 2018-05-02: qty 1

## 2018-05-02 MED ORDER — SODIUM CHLORIDE 0.9 % IR SOLN
Status: DC | PRN
  Administered 2018-05-02: 3000 mL

## 2018-05-02 MED ORDER — ONDANSETRON HCL 4 MG/2ML IJ SOLN
INTRAMUSCULAR | Status: AC
  Filled 2018-05-02: qty 2

## 2018-05-02 MED ORDER — FENTANYL CITRATE (PF) 100 MCG/2ML IJ SOLN
INTRAMUSCULAR | Status: AC
  Filled 2018-05-02: qty 4

## 2018-05-02 MED ORDER — OXYCODONE HCL 5 MG PO TABS: 5.0000 mg | ORAL_TABLET | Freq: Four times a day (QID) | ORAL | 0 refills | Status: DC | PRN

## 2018-05-02 MED ORDER — PHENYLEPHRINE HCL-NACL 10-0.9 MG/250ML-% IV SOLN
INTRAVENOUS | Status: AC
  Filled 2018-05-02: qty 250

## 2018-05-02 MED ORDER — SODIUM CHLORIDE 0.9 % IJ SOLN
INTRAMUSCULAR | Status: AC
  Filled 2018-05-02: qty 20

## 2018-05-02 MED ORDER — ONDANSETRON HCL 4 MG/2ML IJ SOLN
INTRAMUSCULAR | Status: DC | PRN
  Administered 2018-05-02: 4 mg via INTRAVENOUS

## 2018-05-02 MED ORDER — PROPOFOL 500 MG/50ML IV EMUL
INTRAVENOUS | Status: DC | PRN
  Administered 2018-05-02: 75 ug/kg/min via INTRAVENOUS

## 2018-05-02 MED ORDER — KCL IN DEXTROSE-NACL 20-5-0.45 MEQ/L-%-% IV SOLN
INTRAVENOUS | Status: AC
  Administered 2018-05-02: 14:00:00 via INTRAVENOUS
  Filled 2018-05-02 (×2): qty 1000

## 2018-05-02 MED ORDER — PANTOPRAZOLE SODIUM 40 MG PO TBEC
40.0000 mg | DELAYED_RELEASE_TABLET | Freq: Every day | ORAL | Status: DC
  Administered 2018-05-03 – 2018-05-05 (×3): 40 mg via ORAL
  Filled 2018-05-02 (×2): qty 1

## 2018-05-02 MED ORDER — MIDAZOLAM HCL 2 MG/2ML IJ SOLN
INTRAMUSCULAR | Status: AC
  Filled 2018-05-02: qty 2

## 2018-05-02 MED ORDER — FENTANYL CITRATE (PF) 100 MCG/2ML IJ SOLN
INTRAMUSCULAR | Status: DC | PRN
  Administered 2018-05-02: 50 ug via INTRAVENOUS

## 2018-05-02 MED ORDER — VALACYCLOVIR HCL 500 MG PO TABS
500.0000 mg | ORAL_TABLET | Freq: Every day | ORAL | Status: DC | PRN
  Filled 2018-05-02: qty 1

## 2018-05-02 MED ORDER — PROPOFOL 10 MG/ML IV BOLUS
INTRAVENOUS | Status: DC | PRN
  Administered 2018-05-02 (×3): 20 mg via INTRAVENOUS

## 2018-05-02 MED ORDER — ALUM & MAG HYDROXIDE-SIMETH 200-200-20 MG/5ML PO SUSP: 30.0000 mL | ORAL | Status: DC | PRN

## 2018-05-02 MED ORDER — MAGNESIUM CITRATE PO SOLN: 1.0000 | Freq: Once | ORAL | Status: DC | PRN

## 2018-05-02 MED ORDER — POLYETHYLENE GLYCOL 3350 17 G PO PACK: 17.0000 g | PACK | Freq: Every day | ORAL | 0 refills | Status: AC | PRN

## 2018-05-02 MED ORDER — OXYCODONE HCL 5 MG PO TABS
ORAL_TABLET | ORAL | Status: AC
  Filled 2018-05-02: qty 1

## 2018-05-02 MED ORDER — ASPIRIN EC 325 MG PO TBEC
325.0000 mg | DELAYED_RELEASE_TABLET | Freq: Every day | ORAL | Status: DC
  Administered 2018-05-03 – 2018-05-05 (×3): 325 mg via ORAL
  Filled 2018-05-02 (×3): qty 1

## 2018-05-02 MED ORDER — LORATADINE 10 MG PO TABS
10.0000 mg | ORAL_TABLET | Freq: Every day | ORAL | Status: DC
  Administered 2018-05-02 – 2018-05-05 (×4): 10 mg via ORAL
  Filled 2018-05-02 (×4): qty 1

## 2018-05-02 MED ORDER — ACETAMINOPHEN 10 MG/ML IV SOLN
1000.0000 mg | INTRAVENOUS | Status: AC
  Administered 2018-05-02: 1000 mg via INTRAVENOUS
  Filled 2018-05-02: qty 100

## 2018-05-02 MED ORDER — ONDANSETRON HCL 4 MG/2ML IJ SOLN: 4.0000 mg | Freq: Four times a day (QID) | INTRAMUSCULAR | Status: DC | PRN

## 2018-05-02 MED ORDER — TRANEXAMIC ACID 1000 MG/10ML IV SOLN
1000.0000 mg | INTRAVENOUS | Status: AC
  Administered 2018-05-02: 1000 mg via INTRAVENOUS
  Filled 2018-05-02: qty 10

## 2018-05-02 MED ORDER — SODIUM CHLORIDE 0.9 % IV SOLN
INTRAVENOUS | Status: DC | PRN
  Administered 2018-05-02: 30 ug/min via INTRAVENOUS

## 2018-05-02 MED ORDER — PHENYLEPHRINE 40 MCG/ML (10ML) SYRINGE FOR IV PUSH (FOR BLOOD PRESSURE SUPPORT)
PREFILLED_SYRINGE | INTRAVENOUS | Status: DC | PRN
  Administered 2018-05-02: 80 ug via INTRAVENOUS

## 2018-05-02 MED ORDER — MENTHOL 3 MG MT LOZG: 1.0000 | LOZENGE | OROMUCOSAL | Status: DC | PRN

## 2018-05-02 MED ORDER — MIDAZOLAM HCL 5 MG/5ML IJ SOLN
INTRAMUSCULAR | Status: DC | PRN
  Administered 2018-05-02: 0.5 mg via INTRAVENOUS

## 2018-05-02 MED ORDER — BUPIVACAINE-EPINEPHRINE 0.5% -1:200000 IJ SOLN
INTRAMUSCULAR | Status: AC
  Filled 2018-05-02: qty 1

## 2018-05-02 MED ORDER — LACTATED RINGERS IV SOLN
INTRAVENOUS | Status: DC
  Administered 2018-05-02 (×2): via INTRAVENOUS

## 2018-05-02 MED ORDER — DOCUSATE SODIUM 100 MG PO CAPS: 100.0000 mg | ORAL_CAPSULE | Freq: Two times a day (BID) | ORAL | 1 refills | Status: DC | PRN

## 2018-05-02 MED ORDER — SCOPOLAMINE 1 MG/3DAYS TD PT72
MEDICATED_PATCH | TRANSDERMAL | Status: DC | PRN
  Administered 2018-05-02: 1 via TRANSDERMAL

## 2018-05-02 MED ORDER — FENTANYL CITRATE (PF) 100 MCG/2ML IJ SOLN
25.0000 ug | INTRAMUSCULAR | Status: DC | PRN
  Administered 2018-05-02 (×2): 50 ug via INTRAVENOUS
  Administered 2018-05-02: 25 ug via INTRAVENOUS
  Administered 2018-05-02: 50 ug via INTRAVENOUS

## 2018-05-02 MED ORDER — DIPHENHYDRAMINE HCL 12.5 MG/5ML PO ELIX: 12.5000 mg | ORAL_SOLUTION | ORAL | Status: DC | PRN

## 2018-05-02 MED ORDER — RISAQUAD PO CAPS
1.0000 | ORAL_CAPSULE | Freq: Every day | ORAL | Status: DC
  Administered 2018-05-02 – 2018-05-05 (×4): 1 via ORAL
  Filled 2018-05-02 (×4): qty 1

## 2018-05-02 MED ORDER — METHOCARBAMOL 500 MG IVPB - SIMPLE MED
INTRAVENOUS | Status: AC
  Filled 2018-05-02: qty 50

## 2018-05-02 MED ORDER — POLYVINYL ALCOHOL 1.4 % OP SOLN
1.0000 [drp] | Freq: Three times a day (TID) | OPHTHALMIC | Status: DC
  Administered 2018-05-02 – 2018-05-05 (×9): 1 [drp] via OPHTHALMIC
  Filled 2018-05-02: qty 15

## 2018-05-02 MED ORDER — CEFAZOLIN SODIUM-DEXTROSE 2-4 GM/100ML-% IV SOLN
2.0000 g | Freq: Four times a day (QID) | INTRAVENOUS | Status: AC
  Administered 2018-05-02 – 2018-05-03 (×3): 2 g via INTRAVENOUS
  Filled 2018-05-02 (×3): qty 100

## 2018-05-02 MED ORDER — SCOPOLAMINE 1 MG/3DAYS TD PT72
MEDICATED_PATCH | TRANSDERMAL | Status: AC
  Filled 2018-05-02: qty 1

## 2018-05-02 MED ORDER — BUPIVACAINE-EPINEPHRINE 0.25% -1:200000 IJ SOLN
INTRAMUSCULAR | Status: DC | PRN
  Administered 2018-05-02: 30 mL

## 2018-05-02 MED ORDER — DOCUSATE SODIUM 100 MG PO CAPS
100.0000 mg | ORAL_CAPSULE | Freq: Two times a day (BID) | ORAL | Status: DC
  Administered 2018-05-02 – 2018-05-05 (×6): 100 mg via ORAL
  Filled 2018-05-02 (×6): qty 1

## 2018-05-02 MED ORDER — METHOCARBAMOL 500 MG IVPB - SIMPLE MED
500.0000 mg | Freq: Four times a day (QID) | INTRAVENOUS | Status: DC | PRN
  Administered 2018-05-02: 500 mg via INTRAVENOUS
  Filled 2018-05-02: qty 50

## 2018-05-02 MED ORDER — CLONIDINE HCL (ANALGESIA) 100 MCG/ML EP SOLN
EPIDURAL | Status: DC | PRN
  Administered 2018-05-02: 50 ug

## 2018-05-02 MED ORDER — METHOCARBAMOL 500 MG PO TABS
500.0000 mg | ORAL_TABLET | Freq: Four times a day (QID) | ORAL | Status: DC | PRN
  Administered 2018-05-03: 500 mg via ORAL
  Filled 2018-05-02: qty 1

## 2018-05-02 MED ORDER — BUPIVACAINE IN DEXTROSE 0.75-8.25 % IT SOLN
INTRATHECAL | Status: DC | PRN
  Administered 2018-05-02: 1.6 mL via INTRATHECAL

## 2018-05-02 SURGICAL SUPPLY — 89 items
AGENT HMST SPONGE THK3/8 (HEMOSTASIS)
ATTUNE MED DOME PAT 38 KNEE (Knees) ×1 IMPLANT
ATTUNE MED DOME PAT 38MM KNEE (Knees) ×1 IMPLANT
ATTUNE PS FEM LT SZ 7 CEM KNEE (Femur) ×2 IMPLANT
ATTUNE PSRP INSR SZ7 5 KNEE (Insert) ×1 IMPLANT
ATTUNE PSRP INSR SZ7 5MM KNEE (Insert) ×1 IMPLANT
BAG DECANTER FOR FLEXI CONT (MISCELLANEOUS) ×2 IMPLANT
BAG SPEC THK2 15X12 ZIP CLS (MISCELLANEOUS)
BAG ZIPLOCK 12X15 (MISCELLANEOUS) IMPLANT
BANDAGE ACE 4X5 VEL STRL LF (GAUZE/BANDAGES/DRESSINGS) ×3 IMPLANT
BANDAGE ACE 6X5 VEL STRL LF (GAUZE/BANDAGES/DRESSINGS) ×3 IMPLANT
BASE TIBIAL ROT PLAT SZ 7 KNEE (Knees) IMPLANT
BLADE SAG 18X100X1.27 (BLADE) ×3 IMPLANT
BLADE SAW SGTL 11.0X1.19X90.0M (BLADE) ×1 IMPLANT
BLADE SAW SGTL 13.0X1.19X90.0M (BLADE) ×3 IMPLANT
BSPLAT TIB 7 CMNT ROT PLAT STR (Knees) ×1 IMPLANT
CEMENT HV SMART SET (Cement) ×6 IMPLANT
CLOSURE WOUND 1/2 X4 (GAUZE/BANDAGES/DRESSINGS) ×2
CLOTH 2% CHLOROHEXIDINE 3PK (PERSONAL CARE ITEMS) ×3 IMPLANT
CONT SPECI 4OZ STER CLIK (MISCELLANEOUS) ×2 IMPLANT
COVER SURGICAL LIGHT HANDLE (MISCELLANEOUS) ×3 IMPLANT
CUFF TOURN SGL QUICK 34 (TOURNIQUET CUFF) ×3
CUFF TRNQT CYL 34X4X40X1 (TOURNIQUET CUFF) ×1 IMPLANT
DECANTER SPIKE VIAL GLASS SM (MISCELLANEOUS) ×3 IMPLANT
DRAPE INCISE IOBAN 66X45 STRL (DRAPES) IMPLANT
DRAPE LG THREE QUARTER DISP (DRAPES) ×3 IMPLANT
DRAPE ORTHO SPLIT 77X108 STRL (DRAPES) ×6
DRAPE SHEET LG 3/4 BI-LAMINATE (DRAPES) ×5 IMPLANT
DRAPE SURG ORHT 6 SPLT 77X108 (DRAPES) ×2 IMPLANT
DRAPE U-SHAPE 47X51 STRL (DRAPES) ×3 IMPLANT
DRSG AQUACEL AG ADV 3.5X10 (GAUZE/BANDAGES/DRESSINGS) ×2 IMPLANT
DRSG TEGADERM 4X4.75 (GAUZE/BANDAGES/DRESSINGS) IMPLANT
DURAPREP 26ML APPLICATOR (WOUND CARE) ×3 IMPLANT
ELECT BLADE TIP CTD 4 INCH (ELECTRODE) ×3 IMPLANT
ELECT REM PT RETURN 15FT ADLT (MISCELLANEOUS) ×3 IMPLANT
EVACUATOR 1/8 PVC DRAIN (DRAIN) IMPLANT
FACESHIELD WRAPAROUND (MASK) ×3 IMPLANT
FACESHIELD WRAPAROUND OR TEAM (MASK) IMPLANT
GAUZE SPONGE 2X2 8PLY STRL LF (GAUZE/BANDAGES/DRESSINGS) IMPLANT
GLOVE BIOGEL PI IND STRL 7.0 (GLOVE) IMPLANT
GLOVE BIOGEL PI IND STRL 7.5 (GLOVE) ×1 IMPLANT
GLOVE BIOGEL PI IND STRL 8 (GLOVE) ×1 IMPLANT
GLOVE BIOGEL PI INDICATOR 7.0 (GLOVE) ×2
GLOVE BIOGEL PI INDICATOR 7.5 (GLOVE) ×12
GLOVE BIOGEL PI INDICATOR 8 (GLOVE) ×2
GLOVE SURG SS PI 7.0 STRL IVOR (GLOVE) ×2 IMPLANT
GLOVE SURG SS PI 7.5 STRL IVOR (GLOVE) ×6 IMPLANT
GLOVE SURG SS PI 8.0 STRL IVOR (GLOVE) ×6 IMPLANT
GOWN STRL REUS W/ TWL LRG LVL3 (GOWN DISPOSABLE) IMPLANT
GOWN STRL REUS W/ TWL XL LVL3 (GOWN DISPOSABLE) IMPLANT
GOWN STRL REUS W/TWL LRG LVL3 (GOWN DISPOSABLE) ×3
GOWN STRL REUS W/TWL XL LVL3 (GOWN DISPOSABLE) ×12 IMPLANT
HANDPIECE INTERPULSE COAX TIP (DISPOSABLE) ×3
HEMOSTAT SPONGE AVITENE ULTRA (HEMOSTASIS) ×1 IMPLANT
HOLDER FOLEY CATH W/STRAP (MISCELLANEOUS) ×2 IMPLANT
IMMOBILIZER KNEE 20 (SOFTGOODS) ×3
IMMOBILIZER KNEE 20 THIGH 36 (SOFTGOODS) ×1 IMPLANT
MANIFOLD NEPTUNE II (INSTRUMENTS) ×3 IMPLANT
NDL SAFETY ECLIPSE 18X1.5 (NEEDLE) IMPLANT
NEEDLE HYPO 18GX1.5 SHARP (NEEDLE) ×3
NS IRRIG 1000ML POUR BTL (IV SOLUTION) IMPLANT
PACK TOTAL KNEE CUSTOM (KITS) ×3 IMPLANT
POSITIONER SURGICAL ARM (MISCELLANEOUS) ×3 IMPLANT
SEALER BIPOLAR AQUA 6.0 (INSTRUMENTS) IMPLANT
SET HNDPC FAN SPRY TIP SCT (DISPOSABLE) ×1 IMPLANT
SPONGE GAUZE 2X2 STER 10/PKG (GAUZE/BANDAGES/DRESSINGS)
SPONGE SURGIFOAM ABS GEL 100 (HEMOSTASIS) IMPLANT
STAPLER VISISTAT (STAPLE) IMPLANT
STRIP CLOSURE SKIN 1/2X4 (GAUZE/BANDAGES/DRESSINGS) ×2 IMPLANT
SUT BONE WAX W31G (SUTURE) ×2 IMPLANT
SUT MNCRL AB 4-0 PS2 18 (SUTURE) ×2 IMPLANT
SUT STRATAFIX 0 PDS 27 VIOLET (SUTURE) ×3
SUT VIC AB 1 CT1 27 (SUTURE) ×9
SUT VIC AB 1 CT1 27XBRD ANTBC (SUTURE) ×2 IMPLANT
SUT VIC AB 1 CTX 36 (SUTURE)
SUT VIC AB 1 CTX36XBRD ANBCTR (SUTURE) IMPLANT
SUT VIC AB 2-0 CT1 27 (SUTURE) ×9
SUT VIC AB 2-0 CT1 TAPERPNT 27 (SUTURE) ×3 IMPLANT
SUTURE STRATFX 0 PDS 27 VIOLET (SUTURE) ×1 IMPLANT
SYR 3ML LL SCALE MARK (SYRINGE) IMPLANT
SYR 50ML LL SCALE MARK (SYRINGE) IMPLANT
SYRINGE 20CC LL (MISCELLANEOUS) ×2 IMPLANT
TIBIAL BASE ROT PLAT SZ 7 KNEE (Knees) ×3 IMPLANT
TOWER CARTRIDGE SMART MIX (DISPOSABLE) ×3 IMPLANT
TRAY FOLEY BAG SILVER LF 14FR (CATHETERS) ×2 IMPLANT
TRAY FOLEY MTR SLVR 16FR STAT (SET/KITS/TRAYS/PACK) ×1 IMPLANT
WATER STERILE IRR 1000ML POUR (IV SOLUTION) ×3 IMPLANT
WRAP KNEE MAXI GEL POST OP (GAUZE/BANDAGES/DRESSINGS) ×3 IMPLANT
YANKAUER SUCT BULB TIP 10FT TU (MISCELLANEOUS) ×3 IMPLANT

## 2018-05-02 NOTE — Brief Op Note (Signed)
05/02/2018  10:29 AM  PATIENT:  Linda Watson  82 y.o. female  PRE-OPERATIVE DIAGNOSIS:  Left knee degenerative joint disease  POST-OPERATIVE DIAGNOSIS:  Left knee degenerative joint disease  PROCEDURE:  Procedure(s) with comments: LEFT TOTAL KNEE ARTHROPLASTY (Left) - 120 mins  SURGEON:  Surgeon(s) and Role:    Susa Day, MD - Primary  PHYSICIAN ASSISTANT:   ASSISTANTS: Bissell   ANESTHESIA:   spinal  EBL:  50 mL   BLOOD ADMINISTERED:none  DRAINS: none   LOCAL MEDICATIONS USED:  MARCAINE     SPECIMEN:  meniscus  DISPOSITION OF SPECIMEN:  PATHOLOGY  COUNTS:  yes  TOURNIQUET:  70 min  DICTATION: .Other Dictation: Dictation Number [id number]  PLAN OF CARE: Admit to inpatient   PATIENT DISPOSITION:  PACU - hemodynamically stable.   Delay start of Pharmacological VTE agent (>24hrs) due to surgical blood loss or risk of bleeding: no

## 2018-05-02 NOTE — Anesthesia Procedure Notes (Signed)
Spinal  Start time: 05/02/2018 8:34 AM End time: 05/02/2018 8:41 AM Staffing Anesthesiologist: Suzette Battiest, MD Performed: anesthesiologist  Preanesthetic Checklist Completed: patient identified, site marked, surgical consent, pre-op evaluation, timeout performed, IV checked, risks and benefits discussed and monitors and equipment checked Spinal Block Patient position: sitting Prep: site prepped and draped and DuraPrep Patient monitoring: blood pressure, continuous pulse ox and heart rate Approach: midline Location: L3-4 Injection technique: single-shot Needle Needle type: Pencan  Needle gauge: 24 G Needle length: 9 cm Assessment Sensory level: T6

## 2018-05-02 NOTE — Anesthesia Procedure Notes (Addendum)
Anesthesia Regional Block: Adductor canal block   Pre-Anesthetic Checklist: ,, timeout performed, Correct Patient, Correct Site, Correct Laterality, Correct Procedure, Correct Position, site marked, Risks and benefits discussed,  Surgical consent,  Pre-op evaluation,  At surgeon's request and post-op pain management  Laterality: Left  Prep: chloraprep       Needles:  Injection technique: Single-shot  Needle Type: Echogenic Needle     Needle Length: 9cm  Needle Gauge: 21     Additional Needles:   Procedures:,,,, ultrasound used (permanent image in chart),,,,  Narrative:  Start time: 05/02/2018 7:48 AM End time: 05/02/2018 7:54 AM Injection made incrementally with aspirations every 5 mL.  Performed by: Personally  Anesthesiologist: Suzette Battiest, MD

## 2018-05-02 NOTE — Discharge Instructions (Signed)

## 2018-05-02 NOTE — Progress Notes (Signed)
PHARMACIST - PHYSICIAN ORDER COMMUNICATION  CONCERNING: P&T Medication Policy on Herbal Medications  DESCRIPTION:  This patient's order for:  Biotin has been noted.  This product(s) is classified as an "herbal" or natural product. Due to a lack of definitive safety studies or FDA approval, nonstandard manufacturing practices, plus the potential risk of unknown drug-drug interactions while on inpatient medications, the Pharmacy and Therapeutics Committee does not permit the use of "herbal" or natural products of this type within [facility].   ACTION TAKEN: The pharmacy department is unable to verify this order at this time and your patient has been informed of this safety policy. Please reevaluate patient's clinical condition at discharge and address if the herbal or natural product(s) should be resumed at that time.    Lindell Spar, PharmD, BCPS Pager: [phone] 05/02/2018 1:40 PM

## 2018-05-02 NOTE — Interval H&P Note (Signed)
History and Physical Interval Note:  05/02/2018 8:27 AM  Linda Watson  has presented today for surgery, with the diagnosis of Left knee degenerative joint disease  The various methods of treatment have been discussed with the patient and family. After consideration of risks, benefits and other options for treatment, the patient has consented to  Procedure(s) with comments: LEFT TOTAL KNEE ARTHROPLASTY (Left) - 120 mins as a surgical intervention .  The patient's history has been reviewed, patient examined, no change in status, stable for surgery.  I have reviewed the patient's chart and labs.  Questions were answered to the patient's satisfaction.     [name]

## 2018-05-02 NOTE — Interval H&P Note (Signed)
History and Physical Interval Note:  05/02/2018 8:27 AM  Linda Watson  has presented today for surgery, with the diagnosis of Left knee degenerative joint disease  The various methods of treatment have been discussed with the patient and family. After consideration of risks, benefits and other options for treatment, the patient has consented to  Procedure(s) with comments: LEFT TOTAL KNEE ARTHROPLASTY (Left) - 120 mins as a surgical intervention .  The patient's history has been reviewed, patient examined, no change in status, stable for surgery.  I have reviewed the patient's chart and labs.  Questions were answered to the patient's satisfaction.     Ronnika Collett C

## 2018-05-02 NOTE — Op Note (Signed)
[name], Linda Watson MEDICAL RECORD [id number] ACCOUNT [id number] DATE OF BIRTH:[birth year] FACILITY: [facility] LOCATION: [id number] PHYSICIAN:Valbona Slabach Windy Kalata, MD  OPERATIVE REPORT  DATE OF PROCEDURE:  05/02/2018  PREOPERATIVE DIAGNOSIS:  End-stage osteoarthrosis of the left knee with a valgus deformity.  POSTOPERATIVE DIAGNOSES: 1.  End-stage osteoarthrosis of the left knee with a valgus deformity. 2.  Hemarthrosis.  ANESTHESIA:  Spinal.  ASSISTANT:  Lacie Draft, PA  PROCEDURE PERFORMED:  Left total knee arthroplasty utilizing DePuy Attune size 7 femur, 7 tibia, 5 insert, 38 patella.  HISTORY:  An 82 year old with end-stage osteoarthrosis, the knee locking and giving way, valgus deformity, bone-on-bone.  Hematoma aspirated from her knee approximately 4 weeks prior to the surgery.  She was taken off her meloxicam.  This was bleeding  into the knee.  She was indicated for replacement of the degenerative joint.  Risks and benefits discussed including bleeding, infection, damage to neurovascular structures, no change in symptoms, worsening symptoms, DVT, PE, anesthetic complications,  etc.  She had no constitutional symptoms.  Preoperative labs were essentially unremarkable.  TECHNIQUE:  With the patient in supine position after induction of adequate spinal anesthesia and adductor block, the left lower extremity was prepped and draped and exsanguinated in the usual sterile fashion.  Thigh tourniquet inflated to 250 mmHg.   Valgus deformity was noted.  Midline incision centered over the knee was performed.  Full-thickness flaps developed.  Median parapatellar arthrotomy performed.  Patella everted, knee flexed.  There was a moderate amount of hemosiderin deposited changes  within the fatty tissues.  This was irrigated and evacuated.  No evidence of infection was noted.  Severe osteoarthrosis was noted in the lateral tibial plateau at the femoral condyle and of the patella.  Remnants of the  medial and lateral menisci were  removed on the medial meniscus posteriorly.  There was gray discoloration of the meniscus with hemosiderin-laden synovium.  This was excised and sent to pathology, most likely hemosiderin laden.  Next, the osteophytes were removed with a rongeur.  A step  drill was utilized in the femoral canal.  It was irrigated, and a 5-degree left was used with only 7 off the distal femur due to the slight recurvatum.  Her bone was fairly osteoporotic.  This was then pinned, measured to a 7 off the anterior cortex.   Anterior, posterior chamfer cuts were performed.  The soft tissues were protected posteriorly with a wide curved Crego at all times.  Next, attention turned towards the tibia.  We found the posterolateral low point.  We took 1 off of that parallel to the  shaft, bisecting the tibiotalar joint, 3-degree slope.  I then performed this cut and then checked her in extension.  She had a slight tightness in extension.  Therefore, revised the tibial cut with 2 additional millimeters taken.  This then fit the 5  insert satisfactorily.  The knee was then flexed.  Tibia subluxed.  Completed preparation of the tibia.  Measured it mainly to a 7.  Due to osteoporosis, we looked at maximal coverage.  This to the medial aspect of the tibial tubercle.  We harvested bone  centrally ____ and then drilled the tibial tray and placed our thin insert.  We then turned our attention to the femur, bisected the canal with a block, a notch cut.  This was then performed and removed.  Again, osteoporotic bone was encountered.  A  trial femur was placed.  We drilled our peg holes.  Then I placed  a 5 insert and reduced it.  We had full extension, full flexion, good stability to varus-valgus stress in 30 degrees.  Negative anterior drawer.  We everted the patella, measured it at 25.   We planed it to a 16 with a 95 guide parallel to the anterior cortex.  Again, osteoporosis was noted.  We drilled our peg  holes medialized using them.  After a trial of 38, it was satisfactory.  This was reduced, and we had excellent patellofemoral  tracking.  All trials were then removed.  Bone was placed in the femoral canal.  Checked those serially.  Cauterized any geniculates.  Popliteus intact.  Removed the remnants of the menisci.  Pulsatile lavage was used to clean the joint.  We then  performed a synovectomy of hemosiderin-laden adipose tissue.  Following this, the knee was flexed.  All surfaces thoroughly dried.  We mixed cement on the back table in appropriate fashion, injected into the tibial canal digitally, pressurizing it.   Cemented and impacted the tibial tray with cement on the undersurface of the tibial tray.  Redundant cement removed.  We cemented and impacted the femur, placed a 5 insert, reduced it and held it with an axial load throughout the curing of the cement.   Again, redundant cement was removed.  We cemented and clamped the patella.   Marcaine 0.25% with epinephrine was utilized to anesthetize the periosteum.  The joint was irrigated.  Covered until the curing of the cement.  After this, the tourniquet was  deflated.  Any bleeding, which was minimal, was cauterized.  Bone wax on any cancellous surfaces.  Knee was flexed.  Redundant cement meticulously removed with an osteotome.  We removed the trial 5 insert, copiously irrigated, removing any redundant  cement and placed a permanent 5 insert, reduced it and found full extension, full flexion, good stability with varus-valgus stress at 0 to 30 degrees.  Negative anterior drawer.  Next, with the knee in slight flexion, we reapproximated the patellar  arthrotomy with #1 Vicryl interrupted figure-of-eight sutures following with a running Stratafix.  Following this again, excellent patellofemoral tracking, full flexion, full extension, good stability with varus-valgus stress at 0 to 30 degrees.   Negative anterior drawer.  Subcu was copiously irrigated  with pulsatile lavage.  Subcu with 2-0 and skin with Prolene.  Sterile dressing applied, placed in immobilizer and transferred to the hospital bed into the recovery room in satisfactory condition.  The patient tolerated the procedure well.  No complications.  Assistant Lacie Draft PA.  Good pulses following the procedure.  Discolored medial meniscus and hemosiderin-laden adipose tissue and synovium were sent to pathology.  BLOOD LOSS:  Minimal.  TOURNIQUET TIME:  70 minutes.  LN/NUANCE  D:05/02/2018 T:05/02/2018 JOB:[id number]

## 2018-05-02 NOTE — Transfer of Care (Signed)
Immediate Anesthesia Transfer of Care Note  Patient: Linda Watson  Procedure(s) Performed: LEFT TOTAL KNEE ARTHROPLASTY (Left Knee)  Patient Location: PACU  Anesthesia Type:Spinal and MAC combined with regional for post-op pain  Level of Consciousness: awake, alert , oriented and patient cooperative  Airway & Oxygen Therapy: Patient Spontanous Breathing and Patient connected to face mask oxygen  Post-op Assessment: Report given to RN and Post -op Vital signs reviewed and stable  Post vital signs: Reviewed and stable  Last Vitals:  Vitals Value Taken Time  BP 117/70 05/02/2018 10:56 AM  Temp    Pulse 76 05/02/2018 10:58 AM  Resp 15 05/02/2018 10:58 AM  SpO2 100 % 05/02/2018 10:58 AM  Vitals shown include unvalidated device data.  Last Pain:  Vitals:   05/02/18 0639  TempSrc: Oral         Complications: No apparent anesthesia complications

## 2018-05-02 NOTE — Evaluation (Signed)
Physical Therapy Evaluation Patient Details [name] MRN: [id number] DOB: 04-15-1936 Today's Date: 05/02/2018   History of Present Illness  Pt is a 82 YO female s/p L TKR on 05/02/18. PMH includes patella fracture 2013, hx of hernia repair, hx of shingles, lumbar HNP, lumbar stenosis. Surgical history includes back surgery, bilat knee arthroscopies, hernia repair, L shoulder surgery, lumbar laminectomy and decompression 2019, R TKR 2018.  Clinical Impression   Pt s/p L TKR on 05/02/18. PMH listed above. Pt presents with some difficulty performing bed mobility tasks, decreased tolerance for ambulation secondary to surgery and nausea, and L knee pain. Pt would benefit from acute PT to address deficits. Pt ambulated 5 feet, and was limited by feelings of nausea and slight drop in BP. PT to progress mobility as able.     Follow Up Recommendations Home health PT(Surgeon stated may need SNF in original H&P, pt appropriate for HHPT with son's help for first day at home. Will reassess tomorrow)    Equipment Recommendations  None recommended by PT    Recommendations for Other Services       Precautions / Restrictions Precautions Precautions: Fall Restrictions Weight Bearing Restrictions: No Other Position/Activity Restrictions: WBAT       Mobility  Bed Mobility Overal bed mobility: Needs Assistance Bed Mobility: Supine to Sit       Sit to supine: Min assist   General bed mobility comments: Min assist for LLE management, scooting to EOB. Verbal cuing for sequencing to EOB. Pt with feelings of nausea upon sitting up, BP 108/59 and HR 59. After 2 minutes of sitting EOB, pt with BP of 103/65 and improved nausea.   Transfers Overall transfer level: Needs assistance Equipment used: Rolling walker (2 wheeled) Transfers: Sit to/from Stand Sit to Stand: Min guard;From elevated surface         General transfer comment: Min guard for safety, verbal cuing for hand placement.  Increased time and effort to complete.   Ambulation/Gait Ambulation/Gait assistance: Min assist Gait Distance (Feet): 5 Feet Assistive device: Rolling walker (2 wheeled) Gait Pattern/deviations: Step-to pattern;Decreased stride length;Decreased stance time - left;Decreased weight shift to left;Antalgic;Trunk flexed Gait velocity: decr   General Gait Details: Min assist for steadying, verbal cuing for sequencing. Pt with increased nausea again upon standing and taking a few steps, BP 94/70 and pulse of 68. Pt sat in recliner and further ambulation deferred for today.   Stairs            Wheelchair Mobility    Modified Rankin (Stroke Patients Only)       Balance Overall balance assessment: Needs assistance Sitting-balance support: Feet supported;No upper extremity supported Sitting balance-Leahy Scale: Fair Sitting balance - Comments: Pt able to sit unassisted but cannot tolerate challenge upon sitting EOB.    Standing balance support: Bilateral upper extremity supported Standing balance-Leahy Scale: Poor Standing balance comment: relies on RW for steadying upon standing.                              Pertinent Vitals/Pain Pain Assessment: 0-10 Pain Score: 3  Pain Location: L knee  Pain Descriptors / Indicators: Constant;Sore Pain Intervention(s): Limited activity within patient's tolerance;Ice applied;Monitored during session;Premedicated before session    Home Living Family/patient expects to be discharged to:: Private residence Living Arrangements: Alone Available Help at Discharge: Family;Friend(s);Available PRN/intermittently Type of Home: Apartment Home Access: Stairs to enter Entrance Stairs-Rails: Right;Left;Can reach both Entrance [employer]  of Steps: 9 Home Layout: One level Home Equipment: Bedside commode;Walker - 2 wheels;Cane - single point Additional Comments: Son will stay with pt for the first night, he lives in [place].     Prior  Function Level of Independence: Independent               Hand Dominance   Dominant Hand: Right    Extremity/Trunk Assessment   Upper Extremity Assessment Upper Extremity Assessment: Overall WFL for tasks assessed    Lower Extremity Assessment Lower Extremity Assessment: LLE deficits/detail LLE Deficits / Details: Suspected post-surgical LLE weakness, able to perform quad sets x5 and SLR x2 with approximately 10 degrees quad lag  LLE Sensation: WNL       Communication   Communication: No difficulties  Cognition Arousal/Alertness: Awake/alert Behavior During Therapy: WFL for tasks assessed/performed Overall Cognitive Status: Within Functional Limits for tasks assessed                                        General Comments      Exercises Total Joint Exercises Ankle Circles/Pumps: AROM;Both;10 reps;Supine Quad Sets: AROM;Left;5 reps Straight Leg Raises: Left;Supine;AAROM(2 reps)   Assessment/Plan    PT Assessment Patient needs continued PT services  PT Problem List Decreased strength;Pain;Cardiopulmonary status limiting activity;Decreased range of motion;Decreased activity tolerance;Decreased knowledge of use of DME;Decreased balance;Decreased mobility       PT Treatment Interventions DME instruction;Therapeutic activities;Gait training;Therapeutic exercise;Patient/family education;Stair training;Balance training;Functional mobility training    PT Goals (Current goals can be found in the Care Plan section)  Acute Rehab PT Goals PT Goal Formulation: With patient Time For Goal Achievement: 05/16/18 Potential to Achieve Goals: Good    Frequency 7X/week   Barriers to discharge        Co-evaluation               AM-PAC PT "6 Clicks" Daily Activity  Outcome Measure Difficulty turning over in bed (including adjusting bedclothes, sheets and blankets)?: Unable Difficulty moving from lying on back to sitting on the side of the bed? :  Unable Difficulty sitting down on and standing up from a chair with arms (e.g., wheelchair, bedside commode, etc,.)?: Unable Help needed moving to and from a bed to chair (including a wheelchair)?: A Little Help needed walking in hospital room?: A Little Help needed climbing 3-5 steps with a railing? : A Lot 6 Click Score: 11    End of Session Equipment Utilized During Treatment: Gait belt Activity Tolerance: No increased pain;Treatment limited secondary to medical complications (Comment)(nausea ) Patient left: in chair;with call bell/phone within reach;with chair alarm set Nurse Communication: Mobility status PT Visit Diagnosis: Unsteadiness on feet (R26.81);Difficulty in walking, not elsewhere classified (R26.2)    Time: 6578-4696 PT Time Calculation (min) (ACUTE ONLY): 28 min   Charges:   PT Evaluation $PT Eval Low Complexity: 1 Low PT Treatments $Therapeutic Activity: 8-22 mins        Aspasia Rude Conception Chancy, PT, DPT  Pager # [phone]    Dezire Turk D Nereyda Bowler 05/02/2018, 5:06 PM

## 2018-05-03 ENCOUNTER — Encounter (HOSPITAL_COMMUNITY): Payer: Self-pay | Admitting: Specialist

## 2018-05-03 LAB — CBC
HCT: 32.8 % — ABNORMAL LOW (ref 36.0–46.0)
Hemoglobin: 10.7 g/dL — ABNORMAL LOW (ref 12.0–15.0)
MCH: 29.4 pg (ref 26.0–34.0)
MCHC: 32.6 g/dL (ref 30.0–36.0)
MCV: 90.1 fL (ref 78.0–100.0)
Platelets: 250 10*3/uL (ref 150–400)
RBC: 3.64 MIL/uL — ABNORMAL LOW (ref 3.87–5.11)
RDW: 14.4 % (ref 11.5–15.5)
WBC: 9.8 10*3/uL (ref 4.0–10.5)

## 2018-05-03 LAB — BASIC METABOLIC PANEL
Anion gap: 8 (ref 5–15)
BUN: 13 mg/dL (ref 8–23)
CALCIUM: 8.4 mg/dL — AB (ref 8.9–10.3)
CO2: 28 mmol/L (ref 22–32)
Chloride: 101 mmol/L (ref 98–111)
Creatinine, Ser: 0.81 mg/dL (ref 0.44–1.00)
GFR calc Af Amer: >60 mL/min (ref >60)
GFR calc non Af Amer: >60 mL/min (ref >60)
GLUCOSE: 133 mg/dL — AB (ref 70–99)
POTASSIUM: 3.9 mmol/L (ref 3.5–5.1)
Sodium: 137 mmol/L (ref 135–145)

## 2018-05-03 LAB — PLATELET FUNCTION ASSAY: Collagen / Epinephrine: 71 seconds (ref 0–193)

## 2018-05-03 MED ORDER — ONDANSETRON HCL 4 MG PO TABS: 4.0000 mg | ORAL_TABLET | Freq: Four times a day (QID) | ORAL | 1 refills | Status: DC | PRN

## 2018-05-03 NOTE — Discharge Summary (Signed)
Physician Discharge Summary   Patient ID: Linda Watson MRN: [id number] DOB/AGE: 04-13-1936 82 y.o.  Admit date: 05/02/2018 Discharge date: 05/05/18  Primary Diagnosis: Left knee primary osteoarthritis  Admission Diagnoses:  Past Medical History:  Diagnosis Date  . Arthritis    RIGHT KNEE PAIN AND ARTHRITIS  . Bee sting    left anterior hand, states she was stung yesterday 03-16-17, only c/o mild pain, apparent redness and edema observed; denies any severe allergy to been venom (ie no tongue swelling or throat closing); RN encouraged pt to see her primary to r/o worsening or s/s of infection ; patient agreeable to this   . Fracture, patella    01/21/12 - immobilizer x one month   . GERD (gastroesophageal reflux disease)   . Hemorrhoids   . History of hiatal hernia   . History of shingles SEVERAL YRS AGO   1ST TIME LEFT SIDE AND BACK, 2ND TIME REAR END AREA  . HNP (herniated nucleus pulposus), lumbar   . Lichen planus    LESIONS  IN MOUTH AND OCCASIONALLY GENITAL AREA  . Lumbar stenosis    L5-S1  . PONV (postoperative nausea and vomiting)    SEVERE PONV WITH KNEE REPLACMENT 2018  . Seasonal allergies   . Spinal stenosis    PAIN IN LOWER BACK AND DOWN LEFT LEG -ALSO HAS NUMBNESS AND TINGLING IN LEFT LEG TO FOOT   Discharge Diagnoses:   Principal Problem:   Primary osteoarthritis of left knee Active Problems:   Left knee DJD  Estimated body mass index is 24.98 kg/m as calculated from the following:   Height as of this encounter: 5' 2"  (1.575 m).   Weight as of this encounter: 62 kg.  Procedure:  Procedure(s) (LRB): LEFT TOTAL KNEE ARTHROPLASTY (Left)   Consults: None  HPI: see H&P Laboratory Data: Admission on 05/02/2018  Component Date Value Ref Range Status  . WBC 05/03/2018 9.8  4.0 - 10.5 K/uL Final  . RBC 05/03/2018 3.64* 3.87 - 5.11 MIL/uL Final  . Hemoglobin 05/03/2018 10.7* 12.0 - 15.0 g/dL Final  . HCT 05/03/2018 32.8* 36.0 - 46.0 % Final  . MCV 05/03/2018  90.1  78.0 - 100.0 fL Final  . MCH 05/03/2018 29.4  26.0 - 34.0 pg Final  . MCHC 05/03/2018 32.6  30.0 - 36.0 g/dL Final  . RDW 05/03/2018 14.4  11.5 - 15.5 % Final  . Platelets 05/03/2018 250  150 - 400 K/uL Final   Performed at [facility], [place] [address], [place], [place] [address]  . Sodium 05/03/2018 137  135 - 145 mmol/L Final  . Potassium 05/03/2018 3.9  3.5 - 5.1 mmol/L Final  . Chloride 05/03/2018 101  98 - 111 mmol/L Final  . CO2 05/03/2018 28  22 - 32 mmol/L Final  . Glucose, Bld 05/03/2018 133* 70 - 99 mg/dL Final  . BUN 05/03/2018 13  8 - 23 mg/dL Final  . Creatinine, Ser 05/03/2018 0.81  0.44 - 1.00 mg/dL Final  . Calcium 05/03/2018 8.4* 8.9 - 10.3 mg/dL Final  . GFR calc non Af Amer 05/03/2018 >60  >60 mL/min Final  . GFR calc Af Amer 05/03/2018 >60  >60 mL/min Final   Comment: (NOTE) The eGFR has been calculated using the CKD EPI equation. This calculation has not been validated in all clinical situations. eGFR's persistently <60 mL/min signify possible Chronic Kidney Disease.   . Anion gap 05/03/2018 8  5 - 15 Final   Performed at [facility]  [place] [address], [place], [place] [address]  . PFA Interpretation 05/03/2018          Final   Comment: Platelet function is normal. If patient history/physical examination give strong indication of a bleeding disorder repeat testing for confirmation.        Results of the test should always be interpreted in conjunction with the patient's medical history, clinical presentation and medication history. Patients with Hematocrit values <35.0% or Platelet counts <150,000/uL may result in values above the Laboratory established reference range.   . Collagen / Epinephrine 05/03/2018 71  0 - 193 seconds Final   Performed at [facility], [address], [place], [place] [address]  Hospital Outpatient Visit on 04/27/2018  Component Date Value Ref Range Status  . MRSA, PCR 04/27/2018  NEGATIVE  NEGATIVE Final  . Staphylococcus aureus 04/27/2018 NEGATIVE  NEGATIVE Final   Comment: (NOTE) The Xpert SA Assay (FDA approved for NASAL specimens in patients 31 years of age and older), is one component of a comprehensive surveillance program. It is not intended to diagnose infection nor to guide or monitor treatment. Performed at [facility], [place] [address], [place], [place] [address]   . aPTT 04/27/2018 33  24 - 36 seconds Final   Performed at [facility], [place] [address] [place], [place] [address]  . Sodium 04/27/2018 141  135 - 145 mmol/L Final  . Potassium 04/27/2018 4.4  3.5 - 5.1 mmol/L Final  . Chloride 04/27/2018 102  98 - 111 mmol/L Final  . CO2 04/27/2018 29  22 - 32 mmol/L Final  . Glucose, Bld 04/27/2018 92  70 - 99 mg/dL Final  . BUN 04/27/2018 20  8 - 23 mg/dL Final  . Creatinine, Ser 04/27/2018 0.78  0.44 - 1.00 mg/dL Final  . Calcium 04/27/2018 9.4  8.9 - 10.3 mg/dL Final  . GFR calc non Af Amer 04/27/2018 >60  >60 mL/min Final  . GFR calc Af Amer 04/27/2018 >60  >60 mL/min Final   Comment: (NOTE) The eGFR has been calculated using the CKD EPI equation. This calculation has not been validated in all clinical situations. eGFR's persistently <60 mL/min signify possible Chronic Kidney Disease.   Georgiann Hahn gap 04/27/2018 10  5 - 15 Final   Performed at [facility], [place] [address] [place], [place] [address]  . WBC 04/27/2018 6.0  4.0 - 10.5 K/uL Final  . RBC 04/27/2018 4.59  3.87 - 5.11 MIL/uL Final  . Hemoglobin 04/27/2018 13.4  12.0 - 15.0 g/dL Final  . HCT 04/27/2018 41.7  36.0 - 46.0 % Final  . MCV 04/27/2018 90.8  78.0 - 100.0 fL Final  . MCH 04/27/2018 29.2  26.0 - 34.0 pg Final  . MCHC 04/27/2018 32.1  30.0 - 36.0 g/dL Final  . RDW 04/27/2018 14.4  11.5 - 15.5 % Final  . Platelets 04/27/2018 302  150 - 400 K/uL Final   Performed at [facility], [place] [address]  [place], [place] [address]  . Prothrombin Time 04/27/2018 12.8  11.4 - 15.2 seconds Final  . INR 04/27/2018 0.97   Final   Performed at [facility] [address], [place], [place] [address]  . Color, Urine 04/27/2018 STRAW* YELLOW Final  . APPearance 04/27/2018 CLEAR  CLEAR Final  . Specific Gravity, Urine 04/27/2018 1.004* 1.005 - 1.030 Final  . pH 04/27/2018 6.0  5.0 - 8.0 Final  . Glucose, UA  04/27/2018 NEGATIVE  NEGATIVE mg/dL Final  . Hgb urine dipstick 04/27/2018 SMALL* NEGATIVE Final  . Bilirubin Urine 04/27/2018 NEGATIVE  NEGATIVE Final  . Ketones, ur 04/27/2018 NEGATIVE  NEGATIVE mg/dL Final  . Protein, ur 04/27/2018 NEGATIVE  NEGATIVE mg/dL Final  . Nitrite 04/27/2018 NEGATIVE  NEGATIVE Final  . Leukocytes, UA 04/27/2018 MODERATE* NEGATIVE Final  . RBC / HPF 04/27/2018 0-5  0 - 5 RBC/hpf Final  . WBC, UA 04/27/2018 21-50  0 - 5 WBC/hpf Final  . Bacteria, UA 04/27/2018 NONE SEEN  NONE SEEN Final   Performed at [facility], [place] [address], [place], [place] [address]     X-Rays:Dg Knee Left Port  Result Date: 05/02/2018 CLINICAL DATA:  Status post left total knee joint prosthesis placement. EXAM: PORTABLE LEFT KNEE - 1-2 VIEW COMPARISON:  None. FINDINGS: The patient has undergone total left knee joint prosthesis placement. Radiographic positioning of the prosthetic components is good. The interface with the native bone appears normal. There is small air-fluid levels present in the anterior aspect of the soft tissues. IMPRESSION: No immediate postprocedure complication following left total knee joint prosthesis placement. Electronically Signed   By: David  Martinique M.D.   On: 05/02/2018 12:14    EKG:No orders found for this or any previous visit.   Hospital Course: TILA MILLIRONS is a 82 y.o. who was admitted to [facility]. They were brought to the operating room on 05/02/2018 and underwent Procedure(s): LEFT TOTAL KNEE ARTHROPLASTY.   Patient tolerated the procedure well and was later transferred to the recovery room and then to the orthopaedic floor for postoperative care.  They were given PO and IV analgesics for pain control following their surgery.  They were given 24 hours of postoperative antibiotics of  Anti-infectives (From admission, onward)   Start     Dose/Rate Route Frequency Ordered Stop   05/02/18 1430  ceFAZolin (ANCEF) IVPB 2g/100 mL premix     2 g 200 mL/hr over 30 Minutes Intravenous Every 6 hours 05/02/18 1302 05/03/18 0255   05/02/18 1302  valACYclovir (VALTREX) tablet 500 mg     500 mg Oral Daily PRN 05/02/18 1302     05/02/18 0914  polymyxin B 500,000 Units, bacitracin 50,000 Units in sodium chloride 0.9 % 500 mL irrigation  Status:  Discontinued       As needed 05/02/18 0915 05/02/18 1053   05/02/18 0645  ceFAZolin (ANCEF) IVPB 2g/100 mL premix     2 g 200 mL/hr over 30 Minutes Intravenous On call to O.R. 05/02/18 0350 05/02/18 0837     and started on DVT prophylaxis in the form of Aspirin, TED hose and SCDs.   PT and OT were ordered for total joint protocol.  Discharge planning consulted to help with postop disposition and equipment needs.  Patient had a fair night on the evening of surgery.  They started to get up OOB with therapy on day one. Continued to work with therapy into day two.  By day two, the patient had progressed with therapy and meeting their goals.  Incision was healing well.  Patient was seen in rounds and was ready to go home.   Diet: Regular diet Activity:WBAT Follow-up:in 10-14 days Disposition - Home Discharged Condition: good    Allergies as of 05/03/2018      Reactions   Codeine Nausea Only, Other (See Comments)   Makes pt crazy       Medication List    STOP taking these  medications   meloxicam 15 MG tablet Commonly known as:  MOBIC   methotrexate 2.5 MG tablet Commonly known as:  RHEUMATREX     TAKE these medications   acetaminophen 500 MG tablet Commonly  known as:  TYLENOL Take 1,000 mg by mouth at bedtime.   acetaminophen 650 MG CR tablet Commonly known as:  TYLENOL Take 650 mg by mouth daily.   aspirin EC 81 MG tablet Take 1 tablet (81 mg total) by mouth 2 (two) times daily with a meal.   Biotin 1000 MCG tablet Take 1,000 mcg by mouth daily.   docusate sodium 100 MG capsule Commonly known as:  COLACE Take 1 capsule (100 mg total) by mouth 2 (two) times daily as needed for mild constipation. What changed:    when to take this  reasons to take this   hydrocortisone 2.5 % cream Apply 1 application topically 2 (two) times daily as needed for itching.   loratadine 10 MG tablet Commonly known as:  CLARITIN Take 10 mg by mouth daily.   Magnesium 250 MG Tabs Take 250 mg by mouth at bedtime.   ondansetron 4 MG tablet Commonly known as:  ZOFRAN Take 1 tablet (4 mg total) by mouth every 6 (six) hours as needed for nausea.   oxyCODONE 5 MG immediate release tablet Commonly known as:  Oxy IR/ROXICODONE Take 1-2 tablets (5-10 mg total) by mouth every 6 (six) hours as needed for moderate pain ((score 4 to 6)). What changed:  how much to take   pantoprazole 40 MG tablet Commonly known as:  PROTONIX Take 40 mg by mouth daily before breakfast.   polyethylene glycol packet Commonly known as:  MIRALAX / GLYCOLAX Take 17 g by mouth daily as needed for mild constipation. What changed:    when to take this  reasons to take this   SYSTANE ULTRA OP Place 1 drop into both eyes 3 (three) times daily.   valACYclovir 500 MG tablet Commonly known as:  VALTREX Take 500 mg by mouth daily as needed (for infection on bottom).   vitamin B-12 1000 MCG tablet Commonly known as:  CYANOCOBALAMIN Take 1,000 mcg by mouth daily.      Follow-up Information    Susa Day, MD Follow up in 2 week(s).   Specialty:  Orthopedic Surgery Contact information: [address] [place] [address] [phone]            Signed: Lacie Draft, PA-C Orthopaedic Surgery 05/03/2018, 11:27 AM

## 2018-05-03 NOTE — Progress Notes (Signed)
CSW consult-SNF Plan: Plan home with HHPT post-op with family members at home for assistance then transition to outpatient PT when ready. PT will reevaluate today. CSW will follow if SNF needs are needed.   Kathrin Greathouse, Marlinda Mike, MSW Clinical Social Worker  [phone] 05/03/2018  8:43 AM

## 2018-05-03 NOTE — Care Management (Signed)
[facility] rep alerted of HHPT referral. Marney Doctor RN,BSN [phone]

## 2018-05-03 NOTE — Progress Notes (Signed)
Patient ID: Linda Watson, female   DOB: Jan 22, 1936, 82 y.o.   MRN: [id number] Subjective: 1 Day Post-Op Procedure(s) (LRB): LEFT TOTAL KNEE ARTHROPLASTY (Left) Patient reports pain as moderate.   Seen by myself and Dr. Tonita Cong in rounds. Patient has complaints of L knee pain. Nausea controlled with zofran. No other c/o.  We will start therapy today. Plan is to go home with HHPT after hospital stay.  Objective: Vital signs in last 24 hours: Temp:  [97.5 F (36.4 C)-98.8 F (37.1 C)] 98.8 F (37.1 C) (09/05 1125) Pulse Rate:  [58-85] 79 (09/05 1125) Resp:  [12-17] 16 (09/05 1125) BP: (104-120)/(43-66) 120/43 (09/05 1125) SpO2:  [96 %-100 %] 99 % (09/05 1125)  Intake/Output from previous day:  Intake/Output Summary (Last 24 hours) at 05/03/2018 1125 Last data filed at 05/03/2018 0916 Gross per 24 hour  Intake 1095 ml  Output 1750 ml  Net -655 ml    Intake/Output this shift: Total I/O In: 0  Out: 125 [Urine:125]  Labs: Results for orders placed or performed during the hospital encounter of 05/02/18  CBC  Result Value Ref Range   WBC 9.8 4.0 - 10.5 K/uL   RBC 3.64 (L) 3.87 - 5.11 MIL/uL   Hemoglobin 10.7 (L) 12.0 - 15.0 g/dL   HCT 32.8 (L) 36.0 - 46.0 %   MCV 90.1 78.0 - 100.0 fL   MCH 29.4 26.0 - 34.0 pg   MCHC 32.6 30.0 - 36.0 g/dL   RDW 14.4 11.5 - 15.5 %   Platelets 250 150 - 400 K/uL  Basic metabolic panel  Result Value Ref Range   Sodium 137 135 - 145 mmol/L   Potassium 3.9 3.5 - 5.1 mmol/L   Chloride 101 98 - 111 mmol/L   CO2 28 22 - 32 mmol/L   Glucose, Bld 133 (H) 70 - 99 mg/dL   BUN 13 8 - 23 mg/dL   Creatinine, Ser 0.81 0.44 - 1.00 mg/dL   Calcium 8.4 (L) 8.9 - 10.3 mg/dL   GFR calc non Af Amer >60 >60 mL/min   GFR calc Af Amer >60 >60 mL/min   Anion gap 8 5 - 15  Platelet function assay  Result Value Ref Range   PFA Interpretation           Collagen / Epinephrine 71 0 - 193 seconds    Exam - Neurologically intact ABD soft Neurovascular  intact Sensation intact distally Intact pulses distally Dorsiflexion/Plantar flexion intact Incision: dressing C/D/I and no drainage No cellulitis present Compartment soft no calf pain or sign of DVT Dressing - clean, dry, no drainage Motor function intact - moving foot and toes well on exam.   Assessment/Plan: 1 Day Post-Op Procedure(s) (LRB): LEFT TOTAL KNEE ARTHROPLASTY (Left)  Advance diet Up with therapy D/C IV fluids Past Medical History:  Diagnosis Date  . Arthritis    RIGHT KNEE PAIN AND ARTHRITIS  . Bee sting    left anterior hand, states she was stung yesterday 03-16-17, only c/o mild pain, apparent redness and edema observed; denies any severe allergy to been venom (ie no tongue swelling or throat closing); RN encouraged pt to see her primary to r/o worsening or s/s of infection ; patient agreeable to this   . Fracture, patella    01/21/12 - immobilizer x one month   . GERD (gastroesophageal reflux disease)   . Hemorrhoids   . History of hiatal hernia   . History of shingles SEVERAL YRS AGO   1ST  TIME LEFT SIDE AND BACK, 2ND TIME REAR END AREA  . HNP (herniated nucleus pulposus), lumbar   . Lichen planus    LESIONS  IN MOUTH AND OCCASIONALLY GENITAL AREA  . Lumbar stenosis    L5-S1  . PONV (postoperative nausea and vomiting)    SEVERE PONV WITH KNEE REPLACMENT 2018  . Seasonal allergies   . Spinal stenosis    PAIN IN LOWER BACK AND DOWN LEFT LEG -ALSO HAS NUMBNESS AND TINGLING IN LEFT LEG TO FOOT    DVT Prophylaxis - ASA Protocol Weight-Bearing as tolerated to Left leg No vaccines HHPT ordered for D/C Plan D/C Friday or Saturday depending on pain and progress with PT, home with HHPT when ready  BISSELL, JACLYN M. 05/03/2018, 11:25 AM

## 2018-05-03 NOTE — Progress Notes (Signed)
Physical Therapy Treatment Patient Details [name] MRN: [id number] DOB: 1936-08-13 Today's Date: 05/03/2018    History of Present Illness Pt is a 82 YO female s/p L TKR on 05/02/18. PMH includes patella fracture 2013, hx of hernia repair, hx of shingles, lumbar HNP, lumbar stenosis. Surgical history includes back surgery, bilat knee arthroscopies, hernia repair, L shoulder surgery, lumbar laminectomy and decompression 2019, R TKR 2018.    PT Comments    The patient is progressing well. Plans Dc tomorrow after PT. Has multiple steps to enter home.    Follow Up Recommendations  Home health PT     Equipment Recommendations  None recommended by PT    Recommendations for Other Services       Precautions / Restrictions Precautions Precautions: Fall;Knee Precaution Comments: did not use    Mobility  Bed Mobility         Supine to sit: Min guard Sit to supine: Min assist   General bed mobility comments: Min assist for LLE management, .   Transfers   Equipment used: Rolling walker (2 wheeled) Transfers: Sit to/from Stand Sit to Stand: Min guard;From elevated surface         General transfer comment: Min guard for safety, verbal cuing for hand placement. Increased time and effort to complete.  Assisted back to bed from recliner with RW, stand  pivot  Ambulation/Gait Ambulation/Gait assistance: Min assist Gait Distance (Feet): 20 Feet(then 80) Assistive device: Rolling walker (2 wheeled) Gait Pattern/deviations: Step-to pattern;Step-through pattern;Antalgic     General Gait Details: decreased  WB left, did not use KI   Marine scientist Rankin (Stroke Patients Only)       Balance                                            Cognition Arousal/Alertness: Designer, multimedia Exercises Long Arc QuadSinclair Ship;Left;10  reps;Seated Knee Flexion: AAROM;Left;10 reps;Seated    General Comments        Pertinent Vitals/Pain Pain Score: 4  Pain Location: L knee  Pain Descriptors / Indicators: Constant;Sore;Aching Pain Intervention(s): Monitored during session;Limited activity within patient's tolerance;Premedicated before session;Ice applied    Home Living                      Prior Function            PT Goals (current goals can now be found in the care plan section) Progress towards PT goals: Progressing toward goals    Frequency    7X/week      PT Plan Current plan remains appropriate    Co-evaluation              AM-PAC PT "6 Clicks" Daily Activity  Outcome Measure  Difficulty turning over in bed (including adjusting bedclothes, sheets and blankets)?: A Little Difficulty moving from lying on back to sitting on the side of the bed? : A Little Difficulty sitting down on and standing up from a chair with arms (e.g., wheelchair, bedside  commode, etc,.)?: A Little Help needed moving to and from a bed to chair (including a wheelchair)?: A Little Help needed walking in hospital room?: A Little Help needed climbing 3-5 steps with a railing? : Total 6 Click Score: 16    End of Session   Activity Tolerance: Patient tolerated treatment well Patient left: in bed;with call bell/phone within reach;with family/visitor present Nurse Communication: Mobility status PT Visit Diagnosis: Unsteadiness on feet (R26.81);Difficulty in walking, not elsewhere classified (R26.2)     Time: 0300-9233 PT Time Calculation (min) (ACUTE ONLY): 35 min  Charges:  $Gait Training: 8-22 mins $Therapeutic Exercise: 8-22 mins                        [name] 05/03/2018, 5:32 PM

## 2018-05-03 NOTE — Progress Notes (Signed)
Physical Therapy Treatment Patient Details Name: EMIRETH COCKERHAM MRN: [id number] DOB: 05/28/1936 Today's Date: 05/03/2018    History of Present Illness Pt is a 82 YO female s/p L TKR on 05/02/18. PMH includes patella fracture 2013, hx of hernia repair, hx of shingles, lumbar HNP, lumbar stenosis. Surgical history includes back surgery, bilat knee arthroscopies, hernia repair, L shoulder surgery, lumbar laminectomy and decompression 2019, R TKR 2018.    PT Comments    Patient's progress limited by nausea and dizziness and pain. BP 138/76. HR 85. Patient with dry heving after ambulating. Patient has 9 steps to enter home and currently unable to practice. Recommend another day of therapy .Continue PT.  Follow Up Recommendations  Home health PT     Equipment Recommendations  None recommended by PT    Recommendations for Other Services       Precautions / Restrictions Precautions Precautions: Fall;Knee Required Braces or Orthoses: Knee Immobilizer - Left Knee Immobilizer - Left: Discontinue once straight leg raise with < 10 degree lag Restrictions Weight Bearing Restrictions: No Other Position/Activity Restrictions: WBAT     Mobility  Bed Mobility Overal bed mobility: Needs Assistance Bed Mobility: Supine to Sit;Sit to Supine     Supine to sit: Min assist Sit to supine: Min assist   General bed mobility comments: Min assist for LLE management, .   Transfers Overall transfer level: Needs assistance Equipment used: Rolling walker (2 wheeled) Transfers: Sit to/from [employer] Sit to Stand: Min guard;From elevated surface         General transfer comment: Min guard for safety, verbal cuing for hand placement. Increased time and effort to complete.  Assisted back to bed from recliner with RW, stand an pivot  Ambulation/Gait Ambulation/Gait assistance: Min assist Gait Distance (Feet): 20 Feet(then 40) Assistive device: Rolling walker (2 wheeled) Gait  Pattern/deviations: Step-to pattern;Decreased step length - left;Decreased stance time - left;Antalgic     General Gait Details: Min assist for steadying, verbal cuing for sequencing. Nausea and dry heaves after sitting in recliner   Stairs             Wheelchair Mobility    Modified Rankin (Stroke Patients Only)       Balance                                            Cognition Arousal/Alertness: Awake/alert                                            Exercises Total Joint Exercises Ankle Circles/Pumps: AROM;Both;10 reps;Supine Quad Sets: AROM;Both;10 reps;Supine Heel Slides: AAROM;Supine;Left;10 reps Hip ABduction/ADduction: AAROM;Supine;Left;10 reps Straight Leg Raises: AAROM;Supine;Left;10 reps Goniometric ROM: 5-45 left knee flexion    General Comments        Pertinent Vitals/Pain Pain Score: 7  Pain Location: L knee  Pain Descriptors / Indicators: Constant;Sore;Aching Pain Intervention(s): Limited activity within patient's tolerance;Monitored during session;Premedicated before session;Ice applied    Home Living                      Prior Function            PT Goals (current goals can now be found in the care plan section) Progress towards PT goals: Progressing  toward goals    Frequency    7X/week      PT Plan Current plan remains appropriate(nausea)    Co-evaluation              AM-PAC PT "6 Clicks" Daily Activity  Outcome Measure  Difficulty turning over in bed (including adjusting bedclothes, sheets and blankets)?: A Little Difficulty moving from lying on back to sitting on the side of the bed? : A Little Difficulty sitting down on and standing up from a chair with arms (e.g., wheelchair, bedside commode, etc,.)?: A Lot Help needed moving to and from a bed to chair (including a wheelchair)?: A Lot Help needed walking in hospital room?: A Lot Help needed climbing 3-5 steps with a  railing? : Total 6 Click Score: 13    End of Session Equipment Utilized During Treatment: Left knee immobilizer Activity Tolerance: Treatment limited secondary to medical complications (Comment)(nausea and dry heaves, dizziness) Patient left: in bed;with call bell/phone within reach Nurse Communication: Mobility status PT Visit Diagnosis: Unsteadiness on feet (R26.81);Difficulty in walking, not elsewhere classified (R26.2)     Time: 6568-1275 PT Time Calculation (min) (ACUTE ONLY): 48 min  Charges:  $Gait Training: 8-22 mins $Therapeutic Exercise: 8-22 mins $Self Care/Home Management: 8-22                     [facility] PT [phone]    [name] 05/03/2018, 11:25 AM

## 2018-05-04 ENCOUNTER — Encounter (HOSPITAL_COMMUNITY): Payer: Self-pay | Admitting: Specialist

## 2018-05-04 LAB — CBC
HEMATOCRIT: 33.5 % — AB (ref 36.0–46.0)
HEMOGLOBIN: 10.7 g/dL — AB (ref 12.0–15.0)
MCH: 28.8 pg (ref 26.0–34.0)
MCHC: 31.9 g/dL (ref 30.0–36.0)
MCV: 90.3 fL (ref 78.0–100.0)
Platelets: 250 10*3/uL (ref 150–400)
RBC: 3.71 MIL/uL — AB (ref 3.87–5.11)
RDW: 14.5 % (ref 11.5–15.5)
WBC: 9.6 10*3/uL (ref 4.0–10.5)

## 2018-05-04 NOTE — Progress Notes (Addendum)
Physical Therapy Treatment Patient Details [name] MRN: [id number] DOB: Jan 08, 1936 Today's Date: 05/04/2018    History of Present Illness 82 YO female s/p L TKR on 05/02/18. hx R TKA 2018, lumbar laminectomy and decompression 2019.    PT Comments    Progressing with mobility. Plan is for d/c home on tomorrow with HHPT. Will plan to practice stair negotiation prior to d/c.    Follow Up Recommendations  Follow surgeon's recommendation for DC plan and follow-up therapies     Equipment Recommendations  None recommended by PT    Recommendations for Other Services       Precautions / Restrictions Precautions Precautions: Fall;Knee Required Braces or Orthoses: Knee Immobilizer - Left Knee Immobilizer - Left: Discontinue once straight leg raise with < 10 degree lag Restrictions Weight Bearing Restrictions: No Other Position/Activity Restrictions: WBAT     Mobility  Bed Mobility Overal bed mobility: Needs Assistance Bed Mobility: Supine to Sit;Sit to Supine     Supine to sit: Min guard Sit to supine: Min guard   General bed mobility comments: close guard for safety. Increased time.  Transfers Overall transfer level: Needs assistance Equipment used: Rolling walker (2 wheeled) Transfers: Sit to/from Stand Sit to Stand: Supervision         General transfer comment: for safety. VC safety, hand/LE placement.   Ambulation/Gait Ambulation/Gait assistance: Min guard Gait Distance (Feet): 80 Feet Assistive device: Rolling walker (2 wheeled) Gait Pattern/deviations: Step-to pattern;Antalgic;Decreased stance time - left     General Gait Details: VCs safety, sequence, step length. Close guard for safety.    Stairs             Wheelchair Mobility    Modified Rankin (Stroke Patients Only)       Balance                                            Cognition Arousal/Alertness: Awake/alert Behavior During Therapy: WFL for tasks  assessed/performed Overall Cognitive Status: Within Functional Limits for tasks assessed                                        Exercises      General Comments        Pertinent Vitals/Pain Pain Assessment: 0-10 Pain Score: 5  Pain Location: L knee  Pain Descriptors / Indicators: Aching;Sore Pain Intervention(s): Monitored during session    Home Living                      Prior Function            PT Goals (current goals can now be found in the care plan section) Progress towards PT goals: Progressing toward goals    Frequency    7X/week      PT Plan Current plan remains appropriate    Co-evaluation              AM-PAC PT "6 Clicks" Daily Activity  Outcome Measure  Difficulty turning over in bed (including adjusting bedclothes, sheets and blankets)?: A Little Difficulty moving from lying on back to sitting on the side of the bed? : A Little Difficulty sitting down on and standing up from a chair with arms (e.g., wheelchair, bedside commode, etc,.)?: A Little  Help needed moving to and from a bed to chair (including a wheelchair)?: A Little Help needed walking in hospital room?: A Little Help needed climbing 3-5 steps with a railing? : A Little 6 Click Score: 18    End of Session Equipment Utilized During Treatment: Left knee immobilizer Activity Tolerance: Patient tolerated treatment well Patient left: in bed;with call bell/phone within reach   PT Visit Diagnosis: Other abnormalities of gait and mobility (R26.89);Difficulty in walking, not elsewhere classified (R26.2);Pain Pain - Right/Left: Left Pain - part of body: Knee     Time: 1334-1350 PT Time Calculation (min) (ACUTE ONLY): 16 min  Charges:  $Gait Training: 8-22 mins                        Weston Anna, MPT Pager: [phone]

## 2018-05-04 NOTE — Progress Notes (Signed)
Patient ID: Linda Watson, female   DOB: 1935-09-25, 82 y.o.   MRN: [id number] Subjective: 2 Days Post-Op Procedure(s) (LRB): LEFT TOTAL KNEE ARTHROPLASTY (Left) Patient reports pain as moderate.    Patient has complaints of L knee pain. Some better than yesterday. No numbness, tingling, CP, SOB, fever, chills. No other c/o.  Objective: Vital signs in last 24 hours: Temp:  [98.6 F (37 C)-99.3 F (37.4 C)] 98.6 F (37 C) (09/06 0258) Pulse Rate:  [77-83] 77 (09/06 0258) Resp:  [15-16] 16 (09/06 0258) BP: (93-120)/(43-67) 108/60 (09/06 0258) SpO2:  [92 %-99 %] 95 % (09/06 0258)  Intake/Output from previous day:  Intake/Output Summary (Last 24 hours) at 05/04/2018 0827 Last data filed at 05/04/2018 0600 Gross per 24 hour  Intake 790 ml  Output 125 ml  Net 665 ml    Intake/Output this shift: No intake/output data recorded.  Labs: Results for orders placed or performed during the hospital encounter of 05/02/18  CBC  Result Value Ref Range   WBC 9.8 4.0 - 10.5 K/uL   RBC 3.64 (L) 3.87 - 5.11 MIL/uL   Hemoglobin 10.7 (L) 12.0 - 15.0 g/dL   HCT 32.8 (L) 36.0 - 46.0 %   MCV 90.1 78.0 - 100.0 fL   MCH 29.4 26.0 - 34.0 pg   MCHC 32.6 30.0 - 36.0 g/dL   RDW 14.4 11.5 - 15.5 %   Platelets 250 150 - 400 K/uL  Basic metabolic panel  Result Value Ref Range   Sodium 137 135 - 145 mmol/L   Potassium 3.9 3.5 - 5.1 mmol/L   Chloride 101 98 - 111 mmol/L   CO2 28 22 - 32 mmol/L   Glucose, Bld 133 (H) 70 - 99 mg/dL   BUN 13 8 - 23 mg/dL   Creatinine, Ser 0.81 0.44 - 1.00 mg/dL   Calcium 8.4 (L) 8.9 - 10.3 mg/dL   GFR calc non Af Amer >60 >60 mL/min   GFR calc Af Amer >60 >60 mL/min   Anion gap 8 5 - 15  Platelet function assay  Result Value Ref Range   PFA Interpretation           Collagen / Epinephrine 71 0 - 193 seconds  CBC  Result Value Ref Range   WBC 9.6 4.0 - 10.5 K/uL   RBC 3.71 (L) 3.87 - 5.11 MIL/uL   Hemoglobin 10.7 (L) 12.0 - 15.0 g/dL   HCT 33.5 (L) 36.0 - 46.0 %    MCV 90.3 78.0 - 100.0 fL   MCH 28.8 26.0 - 34.0 pg   MCHC 31.9 30.0 - 36.0 g/dL   RDW 14.5 11.5 - 15.5 %   Platelets 250 150 - 400 K/uL    Exam - Neurologically intact ABD soft Neurovascular intact Sensation intact distally Intact pulses distally Dorsiflexion/Plantar flexion intact Incision: dressing C/D/I and no drainage No cellulitis present Compartment soft no calf pain or sign of DVT. Developing ecchymosis medial knee Dressing/Incision - clean, dry, no drainage Motor function intact - moving foot and toes well on exam.   Assessment/Plan: 2 Days Post-Op Procedure(s) (LRB): LEFT TOTAL KNEE ARTHROPLASTY (Left)  Advance diet Up with therapy D/C IV fluids Past Medical History:  Diagnosis Date  . Arthritis    RIGHT KNEE PAIN AND ARTHRITIS  . Bee sting    left anterior hand, states she was stung yesterday 03-16-17, only c/o mild pain, apparent redness and edema observed; denies any severe allergy to been venom (ie no tongue swelling  or throat closing); RN encouraged pt to see her primary to r/o worsening or s/s of infection ; patient agreeable to this   . Fracture, patella    01/21/12 - immobilizer x one month   . GERD (gastroesophageal reflux disease)   . Hemorrhoids   . History of hiatal hernia   . History of shingles SEVERAL YRS AGO   1ST TIME LEFT SIDE AND BACK, 2ND TIME REAR END AREA  . HNP (herniated nucleus pulposus), lumbar   . Lichen planus    LESIONS  IN MOUTH AND OCCASIONALLY GENITAL AREA  . Lumbar stenosis    L5-S1  . PONV (postoperative nausea and vomiting)    SEVERE PONV WITH KNEE REPLACMENT 2018  . Seasonal allergies   . Spinal stenosis    PAIN IN LOWER BACK AND DOWN LEFT LEG -ALSO HAS NUMBNESS AND TINGLING IN LEFT LEG TO FOOT   DVT Prophylaxis - ASA Protocol Weight-Bearing as tolerated to Left leg Plan D/C home tomorrow with HHPT ACE removed, place TED today Will discuss with Dr. Mliss Fritz, [place]. 05/04/2018, 8:27 AM

## 2018-05-04 NOTE — Anesthesia Postprocedure Evaluation (Signed)
Anesthesia Post Note  Patient: Linda Watson  Procedure(s) Performed: LEFT TOTAL KNEE ARTHROPLASTY (Left Knee)     Patient location during evaluation: PACU Anesthesia Type: Spinal Level of consciousness: awake and alert Pain management: pain level controlled Vital Signs Assessment: post-procedure vital signs reviewed and stable Respiratory status: spontaneous breathing and respiratory function stable Cardiovascular status: blood pressure returned to baseline and stable Postop Assessment: spinal receding Anesthetic complications: no    Last Vitals:  Vitals:   05/04/18 0258 05/04/18 1329  BP: 108/60 (!) 116/58  Pulse: 77 85  Resp: 16 16  Temp: 37 C 37.1 C  SpO2: 95% 98%    Last Pain:  Vitals:   05/04/18 1329  TempSrc: Oral  PainSc:                  [name]

## 2018-05-04 NOTE — Progress Notes (Signed)
Physical Therapy Treatment Patient Details [name] MRN: [id number] DOB: 01/19/1936 Today's Date: 05/04/2018    History of Present Illness 82 YO female s/p L TKR on 05/02/18. hx R TKA 2018, lumbar laminectomy and decompression 2019.    PT Comments    Progressing with mobility. Pt continues to report moderate pain with activity. She denied dizziness/nausea during this session. Will see a 2nd time later today. Plan is for d/c home tomorrow with HHPT f/u.    Follow Up Recommendations  Follow surgeon's recommendation for DC plan and follow-up therapies     Equipment Recommendations  None recommended by PT    Recommendations for Other Services       Precautions / Restrictions Precautions Precautions: Fall;Knee Required Braces or Orthoses: Knee Immobilizer - Left Knee Immobilizer - Left: Discontinue once straight leg raise with < 10 degree lag Restrictions Weight Bearing Restrictions: No Other Position/Activity Restrictions: WBAT     Mobility  Bed Mobility Overal bed mobility: Needs Assistance Bed Mobility: Supine to Sit;Sit to Supine     Supine to sit: Min guard Sit to supine: Min guard   General bed mobility comments: close guard for safety. Increased time.   Transfers Overall transfer level: Needs assistance Equipment used: Rolling walker (2 wheeled) Transfers: Sit to/from Stand Sit to Stand: Supervision         General transfer comment: for safety. VC safety, hand/LE placement.   Ambulation/Gait Ambulation/Gait assistance: Min guard Gait Distance (Feet): 75 Feet Assistive device: Rolling walker (2 wheeled) Gait Pattern/deviations: Step-to pattern;Antalgic;Decreased stance time - left     General Gait Details: VCs safety, sequence, step length. Close guard for safety. Pt denied lightheadedness/nausea on today.   Stairs             Wheelchair Mobility    Modified Rankin (Stroke Patients Only)       Balance Overall balance  assessment: Mild deficits observed, not formally tested                                          Cognition Arousal/Alertness: Awake/alert Behavior During Therapy: WFL for tasks assessed/performed Overall Cognitive Status: Within Functional Limits for tasks assessed                                        Exercises Total Joint Exercises Ankle Circles/Pumps: AROM;Both;10 reps;Supine Quad Sets: AROM;Both;10 reps;Supine Heel Slides: AAROM;Supine;Left;10 reps Hip ABduction/ADduction: AAROM;Supine;Left;10 reps Straight Leg Raises: AAROM;Supine;Left;10 reps Goniometric ROM: ~5-50 degrees    General Comments        Pertinent Vitals/Pain Pain Assessment: 0-10 Pain Score: 5  Pain Location: L knee  Pain Descriptors / Indicators: Aching;Sore Pain Intervention(s): Monitored during session;Repositioned;Ice applied    Home Living                      Prior Function            PT Goals (current goals can now be found in the care plan section) Progress towards PT goals: Progressing toward goals    Frequency    7X/week      PT Plan Current plan remains appropriate    Co-evaluation              AM-PAC PT "6 Clicks" Daily Activity  Outcome  Measure  Difficulty turning over in bed (including adjusting bedclothes, sheets and blankets)?: A Little Difficulty moving from lying on back to sitting on the side of the bed? : A Little Difficulty sitting down on and standing up from a chair with arms (e.g., wheelchair, bedside commode, etc,.)?: A Little Help needed moving to and from a bed to chair (including a wheelchair)?: A Little Help needed walking in hospital room?: A Little Help needed climbing 3-5 steps with a railing? : A Lot 6 Click Score: 17    End of Session Equipment Utilized During Treatment: Left knee immobilizer Activity Tolerance: Patient tolerated treatment well Patient left: in bed;with call bell/phone within reach    PT Visit Diagnosis: Other abnormalities of gait and mobility (R26.89);Difficulty in walking, not elsewhere classified (R26.2);Pain Pain - Right/Left: Left Pain - part of body: Knee     Time: 0950-1016 PT Time Calculation (min) (ACUTE ONLY): 26 min  Charges:  $Gait Training: 8-22 mins $Therapeutic Exercise: 8-22 mins                       Weston Anna, MPT Pager: [phone]

## 2018-05-05 LAB — CBC
HCT: 31.3 % — ABNORMAL LOW (ref 36.0–46.0)
HEMOGLOBIN: 10.3 g/dL — AB (ref 12.0–15.0)
MCH: 29.7 pg (ref 26.0–34.0)
MCHC: 32.9 g/dL (ref 30.0–36.0)
MCV: 90.2 fL (ref 78.0–100.0)
PLATELETS: 247 10*3/uL (ref 150–400)
RBC: 3.47 MIL/uL — ABNORMAL LOW (ref 3.87–5.11)
RDW: 14.5 % (ref 11.5–15.5)
WBC: 8.4 10*3/uL (ref 4.0–10.5)

## 2018-05-05 NOTE — Plan of Care (Signed)
Pt alert and oriented, resting with no complaints. Doing well, pain controlled, plan to d/c home per MD order.

## 2018-05-05 NOTE — Care Management Important Message (Signed)
Important Message  Patient Details  [name] MRN: [id number] Date of Birth: 1936/02/02   Medicare Important Message Given:  Yes    Erenest Rasher, RN 05/05/2018, 10:12 AM

## 2018-05-05 NOTE — Progress Notes (Signed)
Discharge paperwork discussed with pt at the bedside.  She demonstrated understanding.  Pt was escorted in stable condition by wheelchair to main lobby.  

## 2018-05-05 NOTE — Care Management Note (Signed)
Case Management Note  Patient Details  [name] MRN: [id number] Date of Birth: April 11, 1936  Subjective/Objective:    Left TKA                Action/Plan: NCM spoke to pt at bedside. Offered choice for HH/list provided. Pt agreeable to [facility] for HHPT. She has RW and bedside commode at home. Son will be at home to assist with care.   Expected Discharge Date:  05/05/18               Expected Discharge Plan:  [place]  In-House Referral:  NA  Discharge planning Services  CM Consult  Post Acute Care Choice:  Home Health Choice offered to:  Patient  DME Arranged:  N/A DME Agency:  NA  HH Arranged:  PT [place] Agency:  [facility] (formerly [employer])  Status of Service:  Completed, signed off  If discussed at [employer], dates discussed:    Additional Comments:  Erenest Rasher, RN 05/05/2018, 11:09 AM

## 2018-05-05 NOTE — Progress Notes (Signed)
Physical Therapy Treatment Patient Details [name] MRN: [id number] DOB: 17-Sep-1935 Today's Date: 05/05/2018    History of Present Illness 82 YO female s/p L TKR on 05/02/18. hx R TKA 2018, lumbar laminectomy and decompression 2019.    PT Comments    Progressing well with mobility. Reviewed/practiced exercises, gait training, and stair training. All education completed. Okay to d/c from PT standpoint-made RN aware.    Follow Up Recommendations  Follow surgeon's recommendation for DC plan and follow-up therapies     Equipment Recommendations  None recommended by PT    Recommendations for Other Services       Precautions / Restrictions Precautions Precautions: Fall;Knee Required Braces or Orthoses: Knee Immobilizer - Left Knee Immobilizer - Left: Discontinue once straight leg raise with < 10 degree lag Restrictions Weight Bearing Restrictions: No Other Position/Activity Restrictions: WBAT     Mobility  Bed Mobility               General bed mobility comments: oob with NT  Transfers Overall transfer level: Needs assistance Equipment used: Rolling walker (2 wheeled) Transfers: Sit to/from Stand Sit to Stand: Supervision         General transfer comment: for safety. VC safety, hand/LE placement.   Ambulation/Gait Ambulation/Gait assistance: Min guard Gait Distance (Feet): 75 Feet Assistive device: Rolling walker (2 wheeled) Gait Pattern/deviations: Step-to pattern;Antalgic;Decreased stance time - left     General Gait Details: VCs safety, sequence, step length. Close guard for safety.    Stairs Stairs: Yes Stairs assistance: Min guard Stair Management: Step to pattern;Forwards;Two rails Number of Stairs: 2 General stair comments: up and overt portable steps. VCs safety, sequence, technique.    Wheelchair Mobility    Modified Rankin (Stroke Patients Only)       Balance                                             Cognition Arousal/Alertness: Awake/alert Behavior During Therapy: WFL for tasks assessed/performed Overall Cognitive Status: Within Functional Limits for tasks assessed                                        Exercises Total Joint Exercises Ankle Circles/Pumps: AROM;Both;10 reps;Supine Quad Sets: AROM;Both;10 reps;Supine Heel Slides: AAROM;Supine;Left;10 reps Hip ABduction/ADduction: AAROM;Supine;Left;10 reps Straight Leg Raises: AAROM;Supine;Left;10 reps Goniometric ROM: ~5-60 degrees    General Comments        Pertinent Vitals/Pain Pain Assessment: 0-10 Pain Score: 4  Pain Location: L knee  Pain Descriptors / Indicators: Aching;Sore Pain Intervention(s): Monitored during session;Repositioned;Ice applied    Home Living                      Prior Function            PT Goals (current goals can now be found in the care plan section) Progress towards PT goals: Progressing toward goals    Frequency    7X/week      PT Plan Current plan remains appropriate    Co-evaluation              AM-PAC PT "6 Clicks" Daily Activity  Outcome Measure  Difficulty turning over in bed (including adjusting bedclothes, sheets and blankets)?: A Little Difficulty moving from lying on back  to sitting on the side of the bed? : A Little Difficulty sitting down on and standing up from a chair with arms (e.g., wheelchair, bedside commode, etc,.)?: A Little Help needed moving to and from a bed to chair (including a wheelchair)?: A Little Help needed walking in hospital room?: A Little Help needed climbing 3-5 steps with a railing? : A Little 6 Click Score: 18    End of Session Equipment Utilized During Treatment: Gait belt Activity Tolerance: Patient tolerated treatment well Patient left: in chair;with call bell/phone within reach   PT Visit Diagnosis: Other abnormalities of gait and mobility (R26.89);Difficulty in walking, not elsewhere classified  (R26.2);Pain Pain - Right/Left: Left Pain - part of body: Knee     Time: 2637-8588 PT Time Calculation (min) (ACUTE ONLY): 11 min  Charges:  $Gait Training: 8-22 mins                        Weston Anna, MPT Pager: [phone]

## 2018-05-05 NOTE — Progress Notes (Signed)
     Subjective: 3 Days Post-Op Procedure(s) (LRB): LEFT TOTAL KNEE ARTHROPLASTY (Left)   Patient reports pain as mild, pain controlled. No events throughout the night. She states that she has been through this before with the other knee and knows what to expect. Looking to work with PT this AM.  She feels that she is ready to be discharge home.   Objective:   VITALS:   Vitals:   05/04/18 2219 05/05/18 0558  BP: 121/63 122/73  Pulse: 95 78  Resp: 16 16  Temp: 98.6 F (37 C) 98.1 F (36.7 C)  SpO2: 94% 97%    Dorsiflexion/Plantar flexion intact Incision: dressing C/D/I No cellulitis present Compartment soft  LABS Recent Labs    05/03/18 0541 05/04/18 0501 05/05/18 0402  HGB 10.7* 10.7* 10.3*  HCT 32.8* 33.5* 31.3*  WBC 9.8 9.6 8.4  PLT 250 250 247    Recent Labs    05/03/18 0541  NA 137  K 3.9  BUN 13  CREATININE 0.81  GLUCOSE 133*     Assessment/Plan: 3 Days Post-Op Procedure(s) (LRB): LEFT TOTAL KNEE ARTHROPLASTY (Left) Up with therapy Discharge home  Follow up in 2 weeks at [facility] ([place]). Follow up with Dr. Tonita Cong in 2 weeks.  Contact information:  [facility]) [address], [address] [place]. Dalanie Kisner   PAC  05/05/2018, 8:37 AM

## 2018-05-06 DIAGNOSIS — Z96652 Presence of left artificial knee joint: Secondary | ICD-10-CM | POA: Diagnosis not present

## 2018-05-12 DIAGNOSIS — Z96652 Presence of left artificial knee joint: Secondary | ICD-10-CM | POA: Diagnosis not present

## 2018-05-16 DIAGNOSIS — M1712 Unilateral primary osteoarthritis, left knee: Secondary | ICD-10-CM | POA: Diagnosis not present

## 2018-05-21 DIAGNOSIS — M25662 Stiffness of left knee, not elsewhere classified: Secondary | ICD-10-CM | POA: Diagnosis not present

## 2018-05-21 DIAGNOSIS — R269 Unspecified abnormalities of gait and mobility: Secondary | ICD-10-CM | POA: Diagnosis not present

## 2018-05-21 DIAGNOSIS — M25562 Pain in left knee: Secondary | ICD-10-CM | POA: Diagnosis not present

## 2018-05-21 DIAGNOSIS — Z96652 Presence of left artificial knee joint: Secondary | ICD-10-CM | POA: Diagnosis not present

## 2018-05-23 ENCOUNTER — Encounter (HOSPITAL_COMMUNITY): Payer: Self-pay | Admitting: Specialist

## 2018-05-23 DIAGNOSIS — M25662 Stiffness of left knee, not elsewhere classified: Secondary | ICD-10-CM | POA: Diagnosis not present

## 2018-05-23 DIAGNOSIS — M25562 Pain in left knee: Secondary | ICD-10-CM | POA: Diagnosis not present

## 2018-05-23 DIAGNOSIS — Z96652 Presence of left artificial knee joint: Secondary | ICD-10-CM | POA: Diagnosis not present

## 2018-05-23 DIAGNOSIS — R269 Unspecified abnormalities of gait and mobility: Secondary | ICD-10-CM | POA: Diagnosis not present

## 2018-05-23 NOTE — Addendum Note (Signed)
Addendum  created 05/23/18 1001 by Suzette Battiest, MD   Intraprocedure Blocks edited, Sign clinical note

## 2018-05-24 DIAGNOSIS — M25662 Stiffness of left knee, not elsewhere classified: Secondary | ICD-10-CM | POA: Diagnosis not present

## 2018-05-24 DIAGNOSIS — M25562 Pain in left knee: Secondary | ICD-10-CM | POA: Diagnosis not present

## 2018-05-24 DIAGNOSIS — R269 Unspecified abnormalities of gait and mobility: Secondary | ICD-10-CM | POA: Diagnosis not present

## 2018-05-24 DIAGNOSIS — Z96652 Presence of left artificial knee joint: Secondary | ICD-10-CM | POA: Diagnosis not present

## 2018-05-28 DIAGNOSIS — M25562 Pain in left knee: Secondary | ICD-10-CM | POA: Diagnosis not present

## 2018-05-28 DIAGNOSIS — R269 Unspecified abnormalities of gait and mobility: Secondary | ICD-10-CM | POA: Diagnosis not present

## 2018-05-28 DIAGNOSIS — M25662 Stiffness of left knee, not elsewhere classified: Secondary | ICD-10-CM | POA: Diagnosis not present

## 2018-05-28 DIAGNOSIS — Z96652 Presence of left artificial knee joint: Secondary | ICD-10-CM | POA: Diagnosis not present

## 2018-05-30 DIAGNOSIS — Z96652 Presence of left artificial knee joint: Secondary | ICD-10-CM | POA: Diagnosis not present

## 2018-05-30 DIAGNOSIS — M25662 Stiffness of left knee, not elsewhere classified: Secondary | ICD-10-CM | POA: Diagnosis not present

## 2018-05-30 DIAGNOSIS — M25562 Pain in left knee: Secondary | ICD-10-CM | POA: Diagnosis not present

## 2018-05-30 DIAGNOSIS — R269 Unspecified abnormalities of gait and mobility: Secondary | ICD-10-CM | POA: Diagnosis not present

## 2018-05-31 DIAGNOSIS — Z96652 Presence of left artificial knee joint: Secondary | ICD-10-CM | POA: Diagnosis not present

## 2018-05-31 DIAGNOSIS — R269 Unspecified abnormalities of gait and mobility: Secondary | ICD-10-CM | POA: Diagnosis not present

## 2018-05-31 DIAGNOSIS — M25562 Pain in left knee: Secondary | ICD-10-CM | POA: Diagnosis not present

## 2018-05-31 DIAGNOSIS — M25662 Stiffness of left knee, not elsewhere classified: Secondary | ICD-10-CM | POA: Diagnosis not present

## 2018-06-04 DIAGNOSIS — Z96652 Presence of left artificial knee joint: Secondary | ICD-10-CM | POA: Diagnosis not present

## 2018-06-04 DIAGNOSIS — M25662 Stiffness of left knee, not elsewhere classified: Secondary | ICD-10-CM | POA: Diagnosis not present

## 2018-06-04 DIAGNOSIS — M25562 Pain in left knee: Secondary | ICD-10-CM | POA: Diagnosis not present

## 2018-06-04 DIAGNOSIS — R269 Unspecified abnormalities of gait and mobility: Secondary | ICD-10-CM | POA: Diagnosis not present

## 2018-06-06 DIAGNOSIS — Z96652 Presence of left artificial knee joint: Secondary | ICD-10-CM | POA: Diagnosis not present

## 2018-06-06 DIAGNOSIS — M25662 Stiffness of left knee, not elsewhere classified: Secondary | ICD-10-CM | POA: Diagnosis not present

## 2018-06-06 DIAGNOSIS — R269 Unspecified abnormalities of gait and mobility: Secondary | ICD-10-CM | POA: Diagnosis not present

## 2018-06-06 DIAGNOSIS — M25562 Pain in left knee: Secondary | ICD-10-CM | POA: Diagnosis not present

## 2018-06-07 DIAGNOSIS — Z96652 Presence of left artificial knee joint: Secondary | ICD-10-CM | POA: Diagnosis not present

## 2018-06-07 DIAGNOSIS — R269 Unspecified abnormalities of gait and mobility: Secondary | ICD-10-CM | POA: Diagnosis not present

## 2018-06-07 DIAGNOSIS — M25662 Stiffness of left knee, not elsewhere classified: Secondary | ICD-10-CM | POA: Diagnosis not present

## 2018-06-07 DIAGNOSIS — M25562 Pain in left knee: Secondary | ICD-10-CM | POA: Diagnosis not present

## 2018-06-11 DIAGNOSIS — Z96652 Presence of left artificial knee joint: Secondary | ICD-10-CM | POA: Diagnosis not present

## 2018-06-11 DIAGNOSIS — M25662 Stiffness of left knee, not elsewhere classified: Secondary | ICD-10-CM | POA: Diagnosis not present

## 2018-06-11 DIAGNOSIS — R269 Unspecified abnormalities of gait and mobility: Secondary | ICD-10-CM | POA: Diagnosis not present

## 2018-06-11 DIAGNOSIS — M25562 Pain in left knee: Secondary | ICD-10-CM | POA: Diagnosis not present

## 2018-06-13 DIAGNOSIS — M1712 Unilateral primary osteoarthritis, left knee: Secondary | ICD-10-CM | POA: Diagnosis not present

## 2018-06-13 DIAGNOSIS — L209 Atopic dermatitis, unspecified: Secondary | ICD-10-CM | POA: Diagnosis not present

## 2018-06-14 DIAGNOSIS — M25562 Pain in left knee: Secondary | ICD-10-CM | POA: Diagnosis not present

## 2018-06-14 DIAGNOSIS — R269 Unspecified abnormalities of gait and mobility: Secondary | ICD-10-CM | POA: Diagnosis not present

## 2018-06-14 DIAGNOSIS — M25662 Stiffness of left knee, not elsewhere classified: Secondary | ICD-10-CM | POA: Diagnosis not present

## 2018-06-14 DIAGNOSIS — Z96652 Presence of left artificial knee joint: Secondary | ICD-10-CM | POA: Diagnosis not present

## 2018-06-18 DIAGNOSIS — M25662 Stiffness of left knee, not elsewhere classified: Secondary | ICD-10-CM | POA: Diagnosis not present

## 2018-06-18 DIAGNOSIS — R269 Unspecified abnormalities of gait and mobility: Secondary | ICD-10-CM | POA: Diagnosis not present

## 2018-06-18 DIAGNOSIS — Z96652 Presence of left artificial knee joint: Secondary | ICD-10-CM | POA: Diagnosis not present

## 2018-06-18 DIAGNOSIS — M25562 Pain in left knee: Secondary | ICD-10-CM | POA: Diagnosis not present

## 2018-06-20 DIAGNOSIS — Z96652 Presence of left artificial knee joint: Secondary | ICD-10-CM | POA: Diagnosis not present

## 2018-06-20 DIAGNOSIS — M25562 Pain in left knee: Secondary | ICD-10-CM | POA: Diagnosis not present

## 2018-06-20 DIAGNOSIS — Z23 Encounter for immunization: Secondary | ICD-10-CM | POA: Diagnosis not present

## 2018-06-20 DIAGNOSIS — M25662 Stiffness of left knee, not elsewhere classified: Secondary | ICD-10-CM | POA: Diagnosis not present

## 2018-06-20 DIAGNOSIS — R269 Unspecified abnormalities of gait and mobility: Secondary | ICD-10-CM | POA: Diagnosis not present

## 2018-06-21 DIAGNOSIS — R269 Unspecified abnormalities of gait and mobility: Secondary | ICD-10-CM | POA: Diagnosis not present

## 2018-06-21 DIAGNOSIS — M25562 Pain in left knee: Secondary | ICD-10-CM | POA: Diagnosis not present

## 2018-06-21 DIAGNOSIS — M25662 Stiffness of left knee, not elsewhere classified: Secondary | ICD-10-CM | POA: Diagnosis not present

## 2018-06-21 DIAGNOSIS — Z96652 Presence of left artificial knee joint: Secondary | ICD-10-CM | POA: Diagnosis not present

## 2018-06-26 DIAGNOSIS — Z96652 Presence of left artificial knee joint: Secondary | ICD-10-CM | POA: Diagnosis not present

## 2018-06-26 DIAGNOSIS — M25562 Pain in left knee: Secondary | ICD-10-CM | POA: Diagnosis not present

## 2018-06-26 DIAGNOSIS — M25662 Stiffness of left knee, not elsewhere classified: Secondary | ICD-10-CM | POA: Diagnosis not present

## 2018-06-26 DIAGNOSIS — R269 Unspecified abnormalities of gait and mobility: Secondary | ICD-10-CM | POA: Diagnosis not present

## 2018-06-28 DIAGNOSIS — M25662 Stiffness of left knee, not elsewhere classified: Secondary | ICD-10-CM | POA: Diagnosis not present

## 2018-06-28 DIAGNOSIS — R269 Unspecified abnormalities of gait and mobility: Secondary | ICD-10-CM | POA: Diagnosis not present

## 2018-06-28 DIAGNOSIS — Z96652 Presence of left artificial knee joint: Secondary | ICD-10-CM | POA: Diagnosis not present

## 2018-06-28 DIAGNOSIS — M25562 Pain in left knee: Secondary | ICD-10-CM | POA: Diagnosis not present

## 2018-07-02 DIAGNOSIS — Z96652 Presence of left artificial knee joint: Secondary | ICD-10-CM | POA: Diagnosis not present

## 2018-07-02 DIAGNOSIS — M25562 Pain in left knee: Secondary | ICD-10-CM | POA: Diagnosis not present

## 2018-07-02 DIAGNOSIS — R269 Unspecified abnormalities of gait and mobility: Secondary | ICD-10-CM | POA: Diagnosis not present

## 2018-07-02 DIAGNOSIS — M25662 Stiffness of left knee, not elsewhere classified: Secondary | ICD-10-CM | POA: Diagnosis not present

## 2018-07-05 DIAGNOSIS — M25662 Stiffness of left knee, not elsewhere classified: Secondary | ICD-10-CM | POA: Diagnosis not present

## 2018-07-05 DIAGNOSIS — M25562 Pain in left knee: Secondary | ICD-10-CM | POA: Diagnosis not present

## 2018-07-05 DIAGNOSIS — R269 Unspecified abnormalities of gait and mobility: Secondary | ICD-10-CM | POA: Diagnosis not present

## 2018-07-05 DIAGNOSIS — Z96652 Presence of left artificial knee joint: Secondary | ICD-10-CM | POA: Diagnosis not present

## 2018-07-10 DIAGNOSIS — Z96652 Presence of left artificial knee joint: Secondary | ICD-10-CM | POA: Diagnosis not present

## 2018-07-10 DIAGNOSIS — M25562 Pain in left knee: Secondary | ICD-10-CM | POA: Diagnosis not present

## 2018-07-10 DIAGNOSIS — M25662 Stiffness of left knee, not elsewhere classified: Secondary | ICD-10-CM | POA: Diagnosis not present

## 2018-07-10 DIAGNOSIS — R269 Unspecified abnormalities of gait and mobility: Secondary | ICD-10-CM | POA: Diagnosis not present

## 2018-07-13 DIAGNOSIS — M25662 Stiffness of left knee, not elsewhere classified: Secondary | ICD-10-CM | POA: Diagnosis not present

## 2018-07-13 DIAGNOSIS — R269 Unspecified abnormalities of gait and mobility: Secondary | ICD-10-CM | POA: Diagnosis not present

## 2018-07-13 DIAGNOSIS — M25562 Pain in left knee: Secondary | ICD-10-CM | POA: Diagnosis not present

## 2018-07-13 DIAGNOSIS — Z96652 Presence of left artificial knee joint: Secondary | ICD-10-CM | POA: Diagnosis not present

## 2018-07-17 DIAGNOSIS — R269 Unspecified abnormalities of gait and mobility: Secondary | ICD-10-CM | POA: Diagnosis not present

## 2018-07-17 DIAGNOSIS — Z96652 Presence of left artificial knee joint: Secondary | ICD-10-CM | POA: Diagnosis not present

## 2018-07-17 DIAGNOSIS — M25562 Pain in left knee: Secondary | ICD-10-CM | POA: Diagnosis not present

## 2018-07-17 DIAGNOSIS — M25662 Stiffness of left knee, not elsewhere classified: Secondary | ICD-10-CM | POA: Diagnosis not present

## 2018-07-20 DIAGNOSIS — M25662 Stiffness of left knee, not elsewhere classified: Secondary | ICD-10-CM | POA: Diagnosis not present

## 2018-07-20 DIAGNOSIS — M25562 Pain in left knee: Secondary | ICD-10-CM | POA: Diagnosis not present

## 2018-07-20 DIAGNOSIS — Z96652 Presence of left artificial knee joint: Secondary | ICD-10-CM | POA: Diagnosis not present

## 2018-07-20 DIAGNOSIS — R269 Unspecified abnormalities of gait and mobility: Secondary | ICD-10-CM | POA: Diagnosis not present

## 2018-07-23 DIAGNOSIS — R269 Unspecified abnormalities of gait and mobility: Secondary | ICD-10-CM | POA: Diagnosis not present

## 2018-07-23 DIAGNOSIS — M25562 Pain in left knee: Secondary | ICD-10-CM | POA: Diagnosis not present

## 2018-07-23 DIAGNOSIS — Z96652 Presence of left artificial knee joint: Secondary | ICD-10-CM | POA: Diagnosis not present

## 2018-07-23 DIAGNOSIS — M25662 Stiffness of left knee, not elsewhere classified: Secondary | ICD-10-CM | POA: Diagnosis not present

## 2018-08-24 DIAGNOSIS — L438 Other lichen planus: Secondary | ICD-10-CM | POA: Diagnosis not present

## 2018-10-16 DIAGNOSIS — M25562 Pain in left knee: Secondary | ICD-10-CM | POA: Diagnosis not present

## 2019-01-29 DIAGNOSIS — Z0000 Encounter for general adult medical examination without abnormal findings: Secondary | ICD-10-CM | POA: Diagnosis not present

## 2019-01-29 DIAGNOSIS — D224 Melanocytic nevi of scalp and neck: Secondary | ICD-10-CM | POA: Diagnosis not present

## 2019-01-29 DIAGNOSIS — L439 Lichen planus, unspecified: Secondary | ICD-10-CM | POA: Diagnosis not present

## 2019-05-07 IMAGING — DX DG KNEE 1-2V PORT*R*
2 series · 2 of 2 positions shown · non-contrast
Comparison: CT 01/20/2012 .

CLINICAL DATA: Total knee replacement.

EXAM:
PORTABLE RIGHT KNEE - 1-2 VIEW

[knee ap]
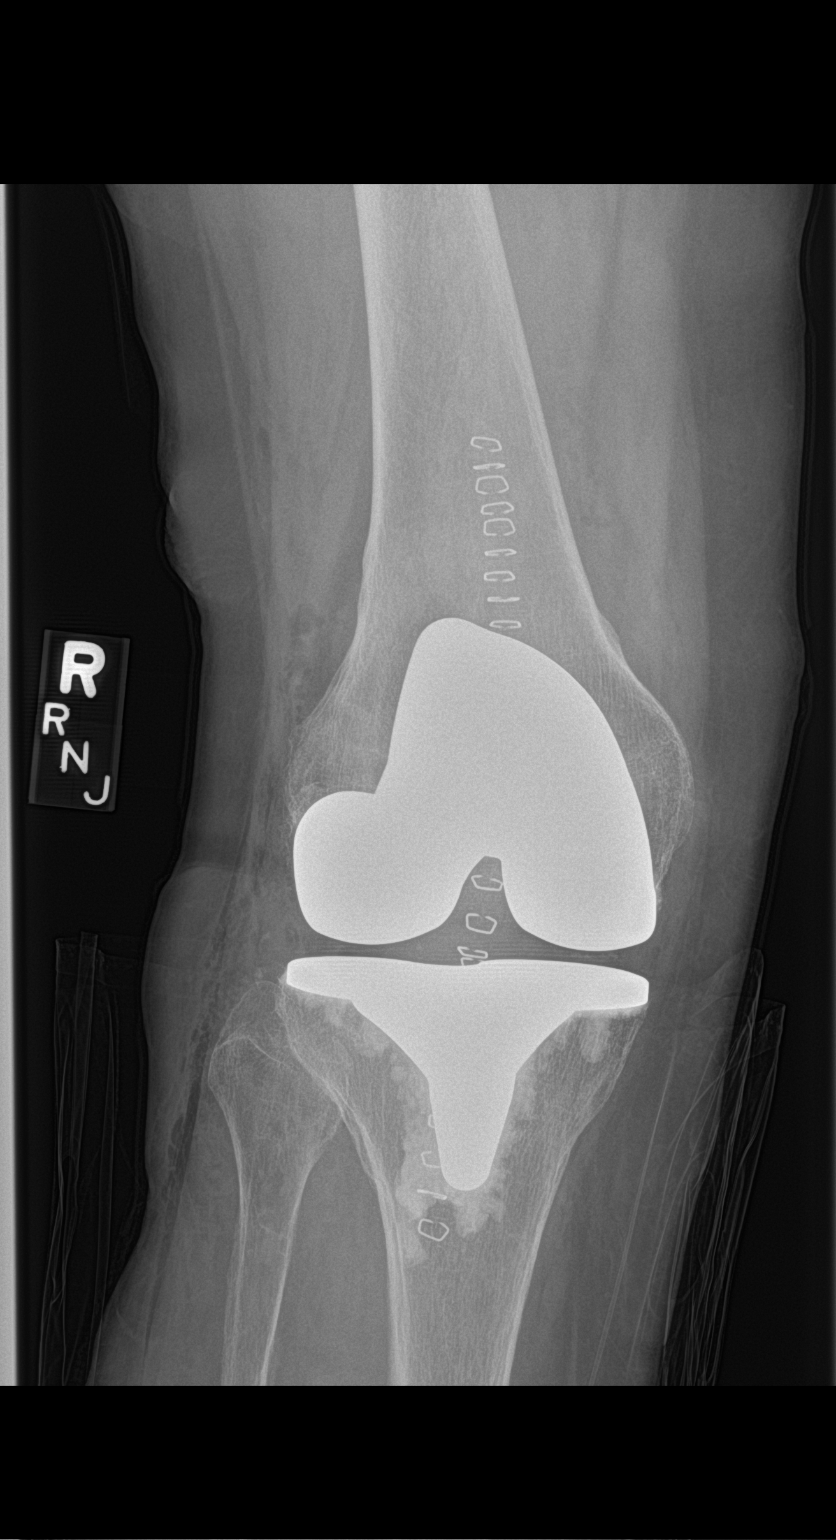

[knee lat]
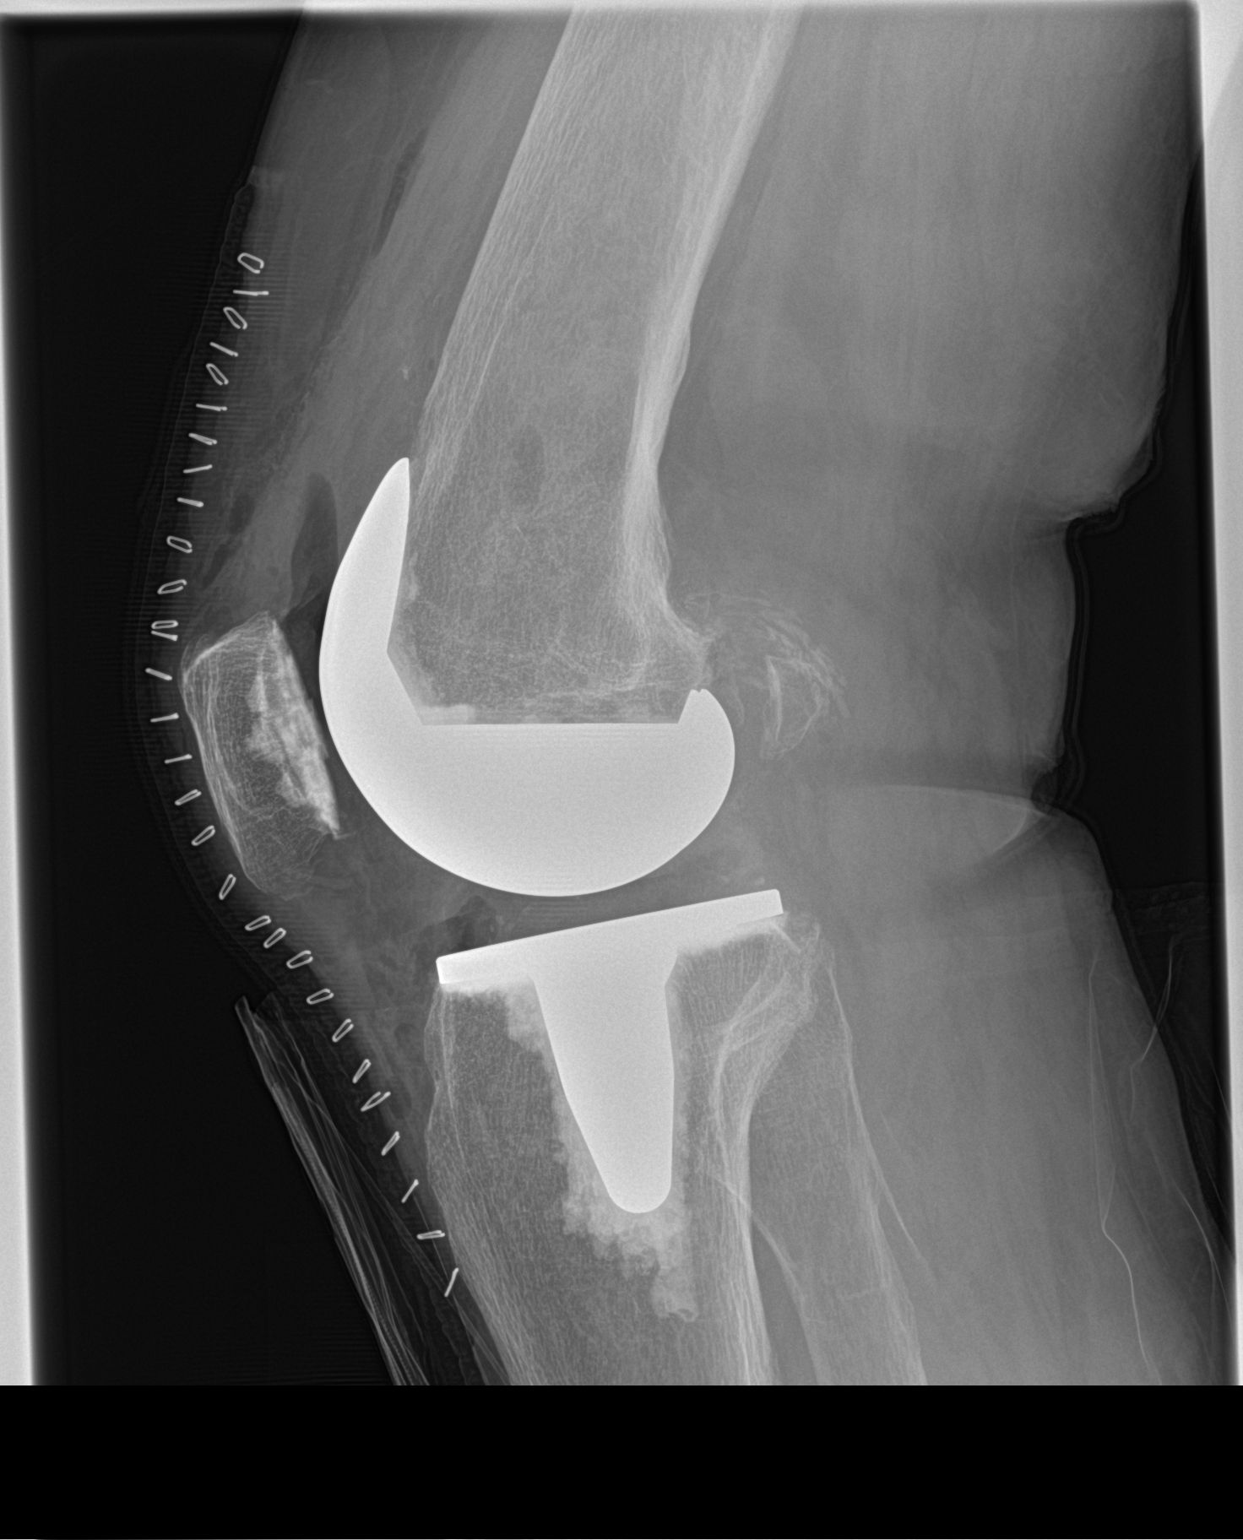

[2 of 2 positions shown; findings below may reference images not displayed]

FINDINGS: Total right knee replacement. Surgical staples. Soft tissue air
consistent prior surgery. Hardware intact. Anatomic alignment.
Diffuse osteopenia again noted. Soft tissue calcification again
noted.
IMPRESSION: Total right knee replacement with anatomic alignment. No acute
abnormality.

## 2019-07-11 DIAGNOSIS — B349 Viral infection, unspecified: Secondary | ICD-10-CM | POA: Diagnosis not present

## 2019-07-11 DIAGNOSIS — U071 COVID-19: Secondary | ICD-10-CM | POA: Diagnosis not present

## 2019-09-09 DIAGNOSIS — K219 Gastro-esophageal reflux disease without esophagitis: Secondary | ICD-10-CM | POA: Diagnosis not present

## 2019-09-09 DIAGNOSIS — Z8616 Personal history of COVID-19: Secondary | ICD-10-CM | POA: Diagnosis not present

## 2019-09-09 DIAGNOSIS — L439 Lichen planus, unspecified: Secondary | ICD-10-CM | POA: Diagnosis not present

## 2019-09-09 DIAGNOSIS — M158 Other polyosteoarthritis: Secondary | ICD-10-CM | POA: Diagnosis not present

## 2019-09-19 ENCOUNTER — Ambulatory Visit: Payer: PPO | Attending: Internal Medicine

## 2019-09-19 DIAGNOSIS — Z23 Encounter for immunization: Secondary | ICD-10-CM | POA: Insufficient documentation

## 2019-09-19 NOTE — Progress Notes (Signed)
   Covid-19 Vaccination Clinic  Name:  Linda Watson    MRN: [id number] DOB: Apr 18, 1936  09/19/2019  [name] was observed post Covid-19 immunization for 15 minutes without incidence. She was provided with Vaccine Information Sheet and instruction to access the V-Safe system.   [name] was instructed to call 911 with any severe reactions post vaccine: Marland Kitchen Difficulty breathing  . Swelling of your face and throat  . A fast heartbeat  . A bad rash all over your body  . Dizziness and weakness    Immunizations Administered    Name Date Dose VIS Date Route   Pfizer COVID-19 Vaccine 09/19/2019  2:24 PM 0.3 mL 08/09/2019 Intramuscular   Manufacturer: [place]   Lot: [id number]   [place]: [id number]

## 2019-10-10 ENCOUNTER — Ambulatory Visit: Payer: PPO | Attending: Internal Medicine

## 2019-10-10 DIAGNOSIS — Z23 Encounter for immunization: Secondary | ICD-10-CM | POA: Insufficient documentation

## 2019-10-10 NOTE — Progress Notes (Signed)
   Covid-19 Vaccination Clinic  Name:  Linda Watson    MRN: [id number] DOB: [birth year]  10/10/2019  [name] was observed post Covid-19 immunization for 15 minutes without incidence. She was provided with Vaccine Information Sheet and instruction to access the V-Safe system.   [name] was instructed to call 911 with any severe reactions post vaccine: Marland Kitchen Difficulty breathing  . Swelling of your face and throat  . A fast heartbeat  . A bad rash all over your body  . Dizziness and weakness    Immunizations Administered    Name Date Dose VIS Date Route   Pfizer COVID-19 Vaccine 10/10/2019 11:41 AM 0.3 mL 08/09/2019 Intramuscular   Manufacturer: [place]   Lot: [id number]   [place]: [id number]

## 2019-10-15 DIAGNOSIS — R079 Chest pain, unspecified: Secondary | ICD-10-CM | POA: Diagnosis not present

## 2019-10-15 DIAGNOSIS — K219 Gastro-esophageal reflux disease without esophagitis: Secondary | ICD-10-CM | POA: Diagnosis not present

## 2019-12-15 IMAGING — CR DG LUMBAR SPINE 2-3V
2 series · 2 of 2 positions shown · non-contrast
Comparison: 03/21/2012.

CLINICAL DATA: Back pain with radiation down left leg.

EXAM:
LUMBAR SPINE - 2-3 VIEW

[w lumbar spine lat]
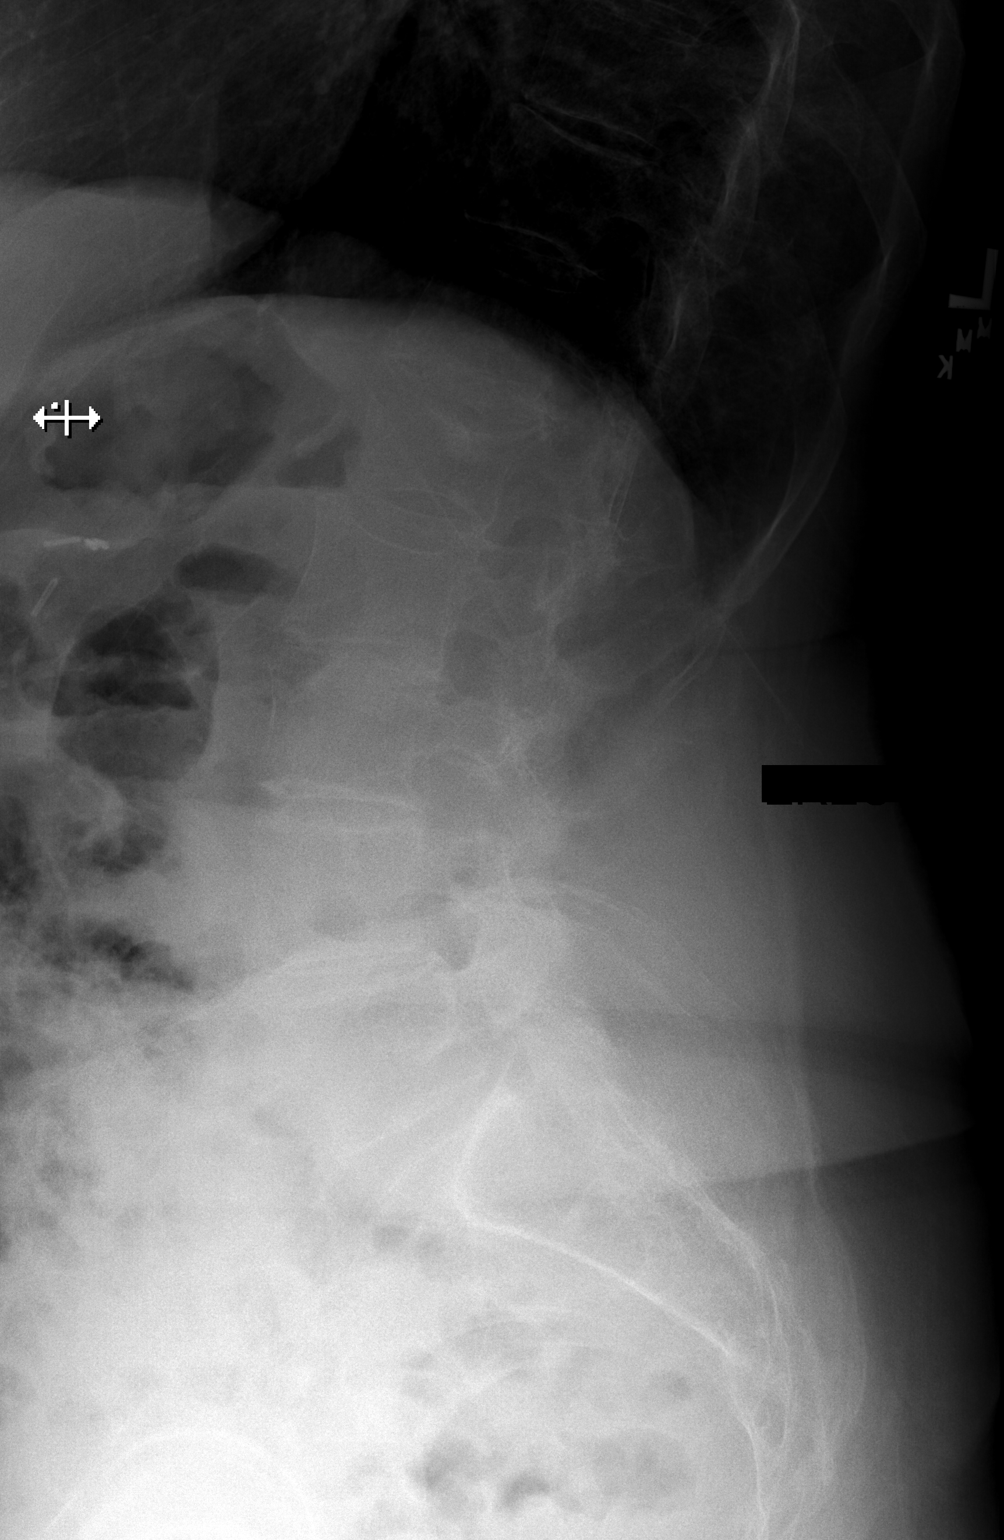

[w lumbar spine ap]
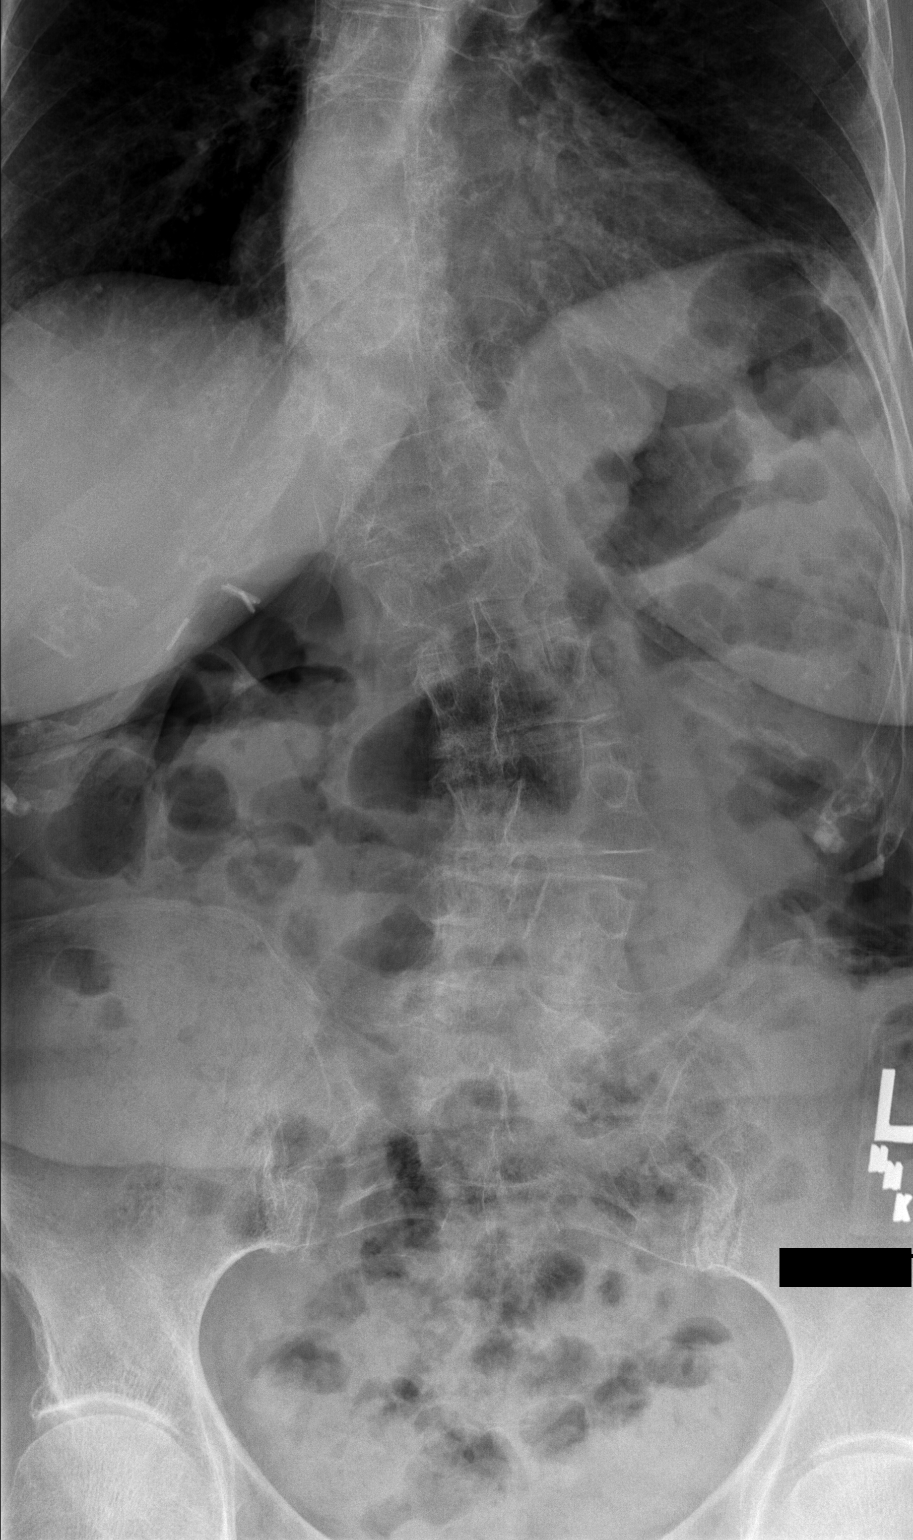

[2 of 2 positions shown; findings below may reference images not displayed]

FINDINGS: Lumbar spine numbered as per prior exam. Prominent thoracolumbar
spine scoliosis. Diffuse osteopenia. Multilevel degenerative change
again noted. Stable mild L1 compression. No acute bony abnormality.
IMPRESSION: 1. Diffuse multilevel degenerative change again noted. Stable L1
mild compression fracture. No acute bony abnormality.

2.  Diffuse osteopenia.  Severe thoracolumbar spine scoliosis.

## 2020-01-01 DIAGNOSIS — H40013 Open angle with borderline findings, low risk, bilateral: Secondary | ICD-10-CM | POA: Diagnosis not present

## 2020-01-29 DIAGNOSIS — J302 Other seasonal allergic rhinitis: Secondary | ICD-10-CM | POA: Diagnosis not present

## 2020-01-29 DIAGNOSIS — H6123 Impacted cerumen, bilateral: Secondary | ICD-10-CM | POA: Diagnosis not present

## 2020-01-29 DIAGNOSIS — J40 Bronchitis, not specified as acute or chronic: Secondary | ICD-10-CM | POA: Diagnosis not present

## 2020-02-05 DIAGNOSIS — H16223 Keratoconjunctivitis sicca, not specified as Sjogren's, bilateral: Secondary | ICD-10-CM | POA: Diagnosis not present

## 2020-03-04 DIAGNOSIS — M25511 Pain in right shoulder: Secondary | ICD-10-CM | POA: Diagnosis not present

## 2020-03-04 DIAGNOSIS — M7541 Impingement syndrome of right shoulder: Secondary | ICD-10-CM | POA: Diagnosis not present

## 2020-03-11 DIAGNOSIS — H6992 Unspecified Eustachian tube disorder, left ear: Secondary | ICD-10-CM | POA: Diagnosis not present

## 2020-03-11 DIAGNOSIS — Z0000 Encounter for general adult medical examination without abnormal findings: Secondary | ICD-10-CM | POA: Diagnosis not present

## 2020-03-11 DIAGNOSIS — K219 Gastro-esophageal reflux disease without esophagitis: Secondary | ICD-10-CM | POA: Diagnosis not present

## 2020-03-11 DIAGNOSIS — L439 Lichen planus, unspecified: Secondary | ICD-10-CM | POA: Diagnosis not present

## 2020-03-11 DIAGNOSIS — Z13220 Encounter for screening for lipoid disorders: Secondary | ICD-10-CM | POA: Diagnosis not present

## 2020-03-11 DIAGNOSIS — R252 Cramp and spasm: Secondary | ICD-10-CM | POA: Diagnosis not present

## 2020-03-11 DIAGNOSIS — M158 Other polyosteoarthritis: Secondary | ICD-10-CM | POA: Diagnosis not present

## 2020-03-17 DIAGNOSIS — R252 Cramp and spasm: Secondary | ICD-10-CM | POA: Diagnosis not present

## 2020-03-17 DIAGNOSIS — Z83430 Family history of elevated lipoprotein(a): Secondary | ICD-10-CM | POA: Diagnosis not present

## 2020-03-17 DIAGNOSIS — Z13220 Encounter for screening for lipoid disorders: Secondary | ICD-10-CM | POA: Diagnosis not present

## 2020-03-17 DIAGNOSIS — L439 Lichen planus, unspecified: Secondary | ICD-10-CM | POA: Diagnosis not present

## 2020-04-29 DIAGNOSIS — K1379 Other lesions of oral mucosa: Secondary | ICD-10-CM | POA: Diagnosis not present

## 2020-04-29 DIAGNOSIS — L439 Lichen planus, unspecified: Secondary | ICD-10-CM | POA: Diagnosis not present

## 2020-05-25 IMAGING — DX DG KNEE 1-2V PORT*L*
2 series · 2 of 2 positions shown · non-contrast
Comparison: None.

CLINICAL DATA: Status post left total knee joint prosthesis
placement.

EXAM:
PORTABLE LEFT KNEE - 1-2 VIEW

[knee ap]
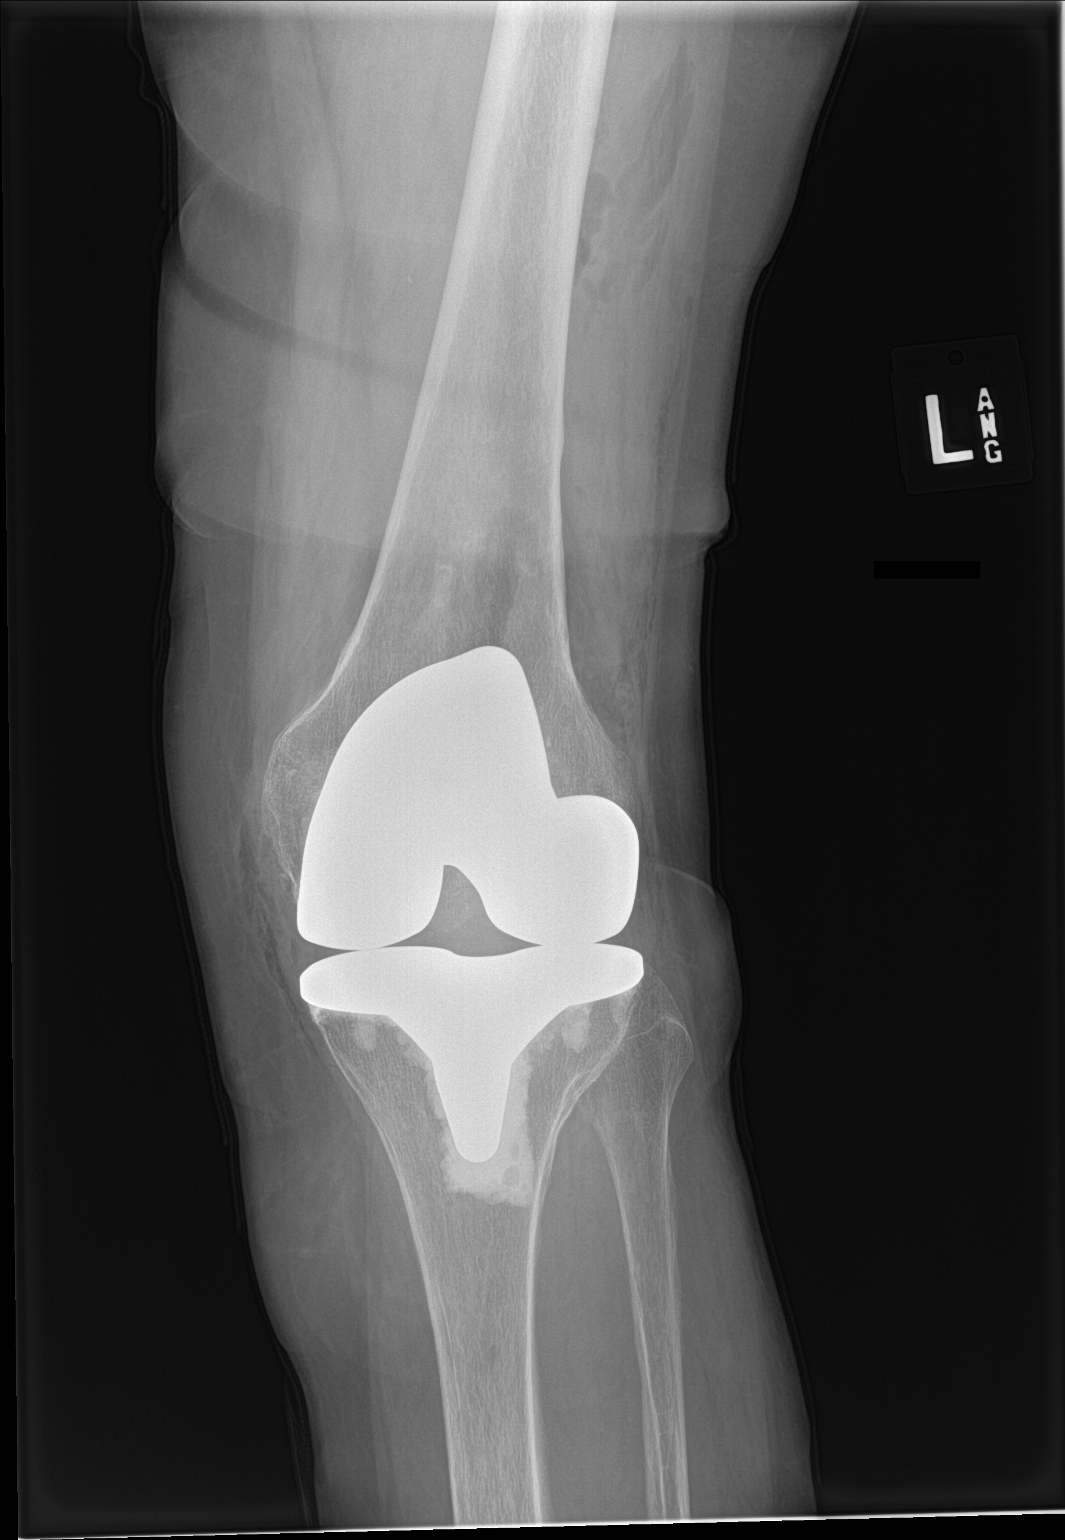

[knee lat]
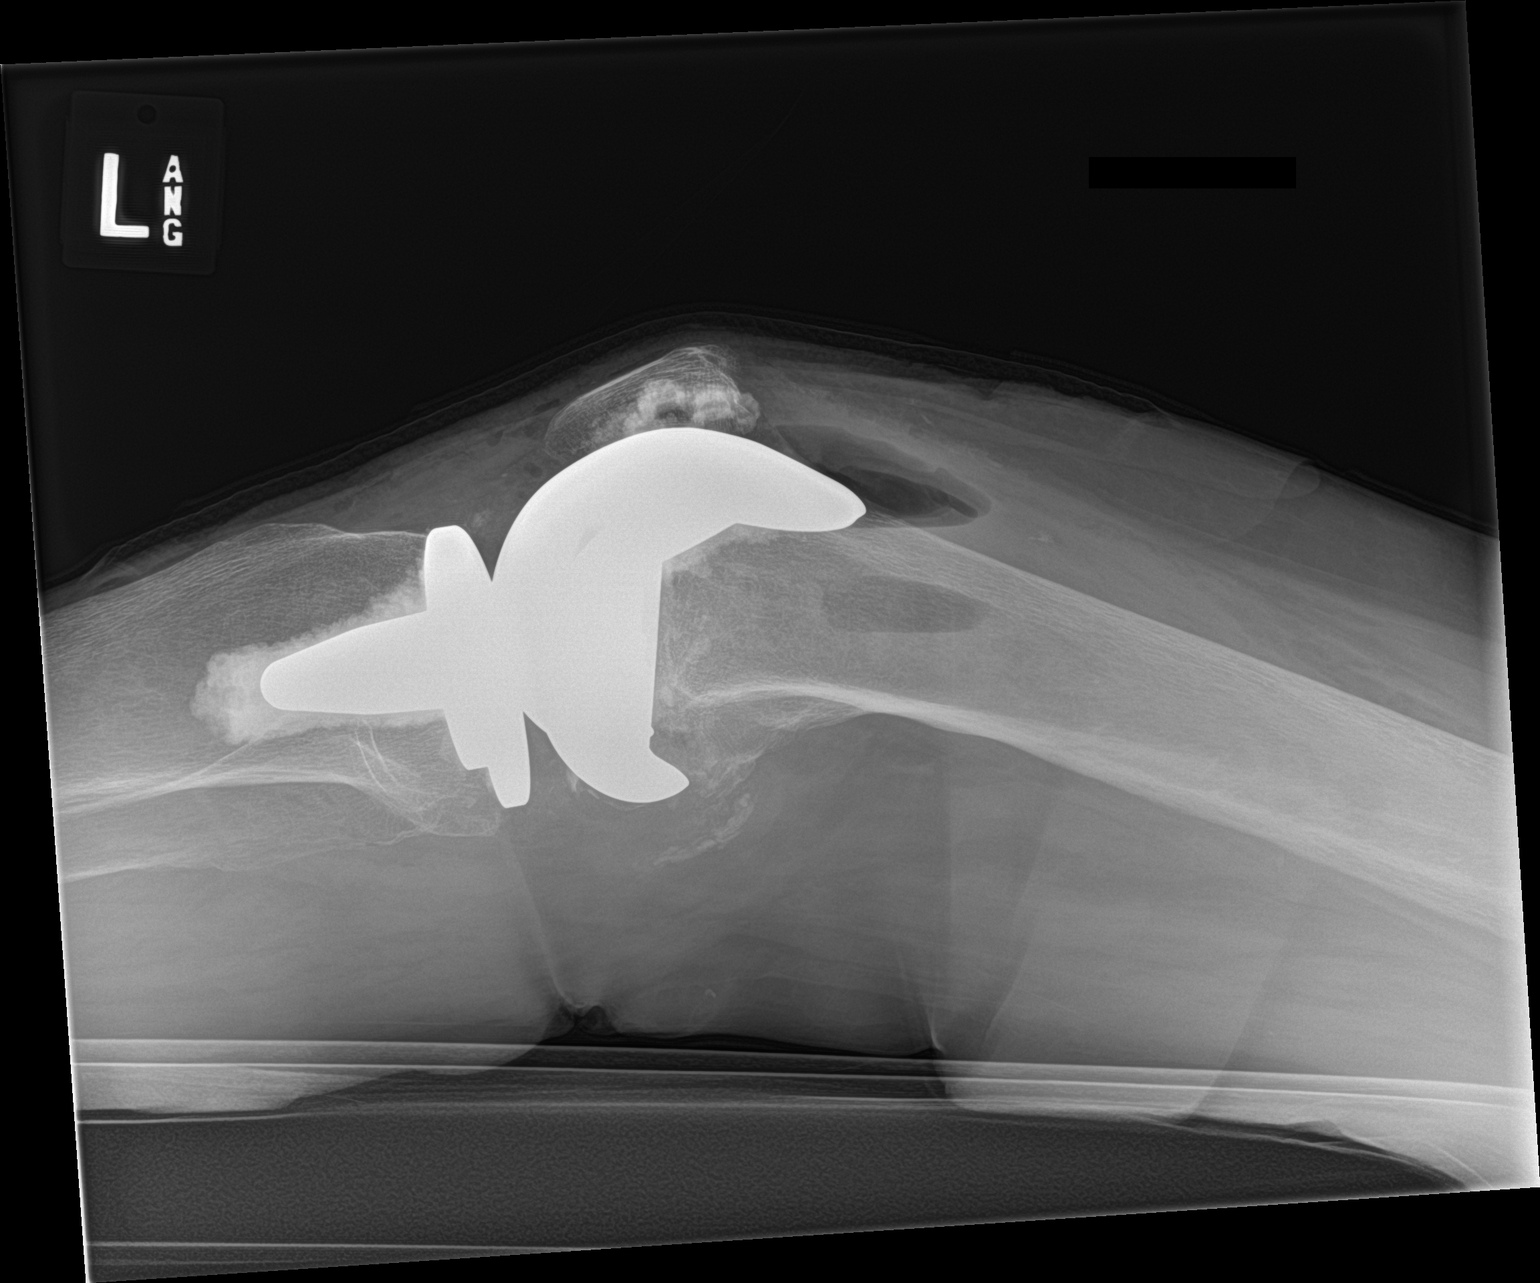

[2 of 2 positions shown; findings below may reference images not displayed]

FINDINGS: The patient has undergone total left knee joint prosthesis
placement. Radiographic positioning of the prosthetic components is
good. The interface with the native bone appears normal. There is
small air-fluid levels present in the anterior aspect of the soft
tissues.
IMPRESSION: No immediate postprocedure complication following left total knee
joint prosthesis placement.

## 2020-08-04 DIAGNOSIS — M48061 Spinal stenosis, lumbar region without neurogenic claudication: Secondary | ICD-10-CM | POA: Diagnosis not present

## 2020-08-04 DIAGNOSIS — M5416 Radiculopathy, lumbar region: Secondary | ICD-10-CM | POA: Diagnosis not present

## 2020-08-04 DIAGNOSIS — M7541 Impingement syndrome of right shoulder: Secondary | ICD-10-CM | POA: Diagnosis not present

## 2020-09-10 DIAGNOSIS — K219 Gastro-esophageal reflux disease without esophagitis: Secondary | ICD-10-CM | POA: Diagnosis not present

## 2020-09-10 DIAGNOSIS — R140 Abdominal distension (gaseous): Secondary | ICD-10-CM | POA: Diagnosis not present

## 2020-09-10 DIAGNOSIS — G8929 Other chronic pain: Secondary | ICD-10-CM | POA: Diagnosis not present

## 2020-09-10 DIAGNOSIS — M25511 Pain in right shoulder: Secondary | ICD-10-CM | POA: Diagnosis not present

## 2020-11-18 ENCOUNTER — Other Ambulatory Visit: Payer: Self-pay | Admitting: Oral Surgery

## 2020-11-18 DIAGNOSIS — C030 Malignant neoplasm of upper gum: Secondary | ICD-10-CM | POA: Diagnosis not present

## 2020-11-18 DIAGNOSIS — C039 Malignant neoplasm of gum, unspecified: Secondary | ICD-10-CM | POA: Diagnosis not present

## 2020-12-03 DIAGNOSIS — H6123 Impacted cerumen, bilateral: Secondary | ICD-10-CM | POA: Diagnosis not present

## 2020-12-03 DIAGNOSIS — C039 Malignant neoplasm of gum, unspecified: Secondary | ICD-10-CM | POA: Diagnosis not present

## 2021-02-23 DIAGNOSIS — H8110 Benign paroxysmal vertigo, unspecified ear: Secondary | ICD-10-CM | POA: Diagnosis not present

## 2021-03-16 DIAGNOSIS — M25511 Pain in right shoulder: Secondary | ICD-10-CM | POA: Diagnosis not present

## 2021-03-16 DIAGNOSIS — K219 Gastro-esophageal reflux disease without esophagitis: Secondary | ICD-10-CM | POA: Diagnosis not present

## 2021-03-16 DIAGNOSIS — N1831 Chronic kidney disease, stage 3a: Secondary | ICD-10-CM | POA: Diagnosis not present

## 2021-03-16 DIAGNOSIS — T8484XA Pain due to internal orthopedic prosthetic devices, implants and grafts, initial encounter: Secondary | ICD-10-CM | POA: Diagnosis not present

## 2021-03-16 DIAGNOSIS — G8929 Other chronic pain: Secondary | ICD-10-CM | POA: Diagnosis not present

## 2021-03-16 DIAGNOSIS — L439 Lichen planus, unspecified: Secondary | ICD-10-CM | POA: Diagnosis not present

## 2021-03-16 DIAGNOSIS — Z96659 Presence of unspecified artificial knee joint: Secondary | ICD-10-CM | POA: Diagnosis not present

## 2021-03-16 DIAGNOSIS — Z0000 Encounter for general adult medical examination without abnormal findings: Secondary | ICD-10-CM | POA: Diagnosis not present

## 2021-03-24 DIAGNOSIS — H40013 Open angle with borderline findings, low risk, bilateral: Secondary | ICD-10-CM | POA: Diagnosis not present

## 2021-03-24 DIAGNOSIS — H16223 Keratoconjunctivitis sicca, not specified as Sjogren's, bilateral: Secondary | ICD-10-CM | POA: Diagnosis not present

## 2021-03-24 DIAGNOSIS — D234 Other benign neoplasm of skin of scalp and neck: Secondary | ICD-10-CM | POA: Diagnosis not present

## 2021-03-31 DIAGNOSIS — Z96652 Presence of left artificial knee joint: Secondary | ICD-10-CM | POA: Diagnosis not present

## 2021-04-06 DIAGNOSIS — C039 Malignant neoplasm of gum, unspecified: Secondary | ICD-10-CM | POA: Diagnosis not present

## 2021-05-17 ENCOUNTER — Encounter (HOSPITAL_COMMUNITY): Payer: Self-pay

## 2021-05-17 ENCOUNTER — Emergency Department (HOSPITAL_COMMUNITY): Payer: PPO

## 2021-05-17 ENCOUNTER — Inpatient Hospital Stay (HOSPITAL_COMMUNITY)
Admission: EM | Admit: 2021-05-17 | Discharge: 2021-05-20 | DRG: 482 | Disposition: A | Discharge status: Home Health | Payer: PPO | Attending: Internal Medicine | Admitting: Internal Medicine

## 2021-05-17 ENCOUNTER — Other Ambulatory Visit: Payer: Self-pay

## 2021-05-17 DIAGNOSIS — Z972 Presence of dental prosthetic device (complete) (partial): Secondary | ICD-10-CM

## 2021-05-17 DIAGNOSIS — K449 Diaphragmatic hernia without obstruction or gangrene: Secondary | ICD-10-CM | POA: Diagnosis not present

## 2021-05-17 DIAGNOSIS — L439 Lichen planus, unspecified: Secondary | ICD-10-CM | POA: Diagnosis present

## 2021-05-17 DIAGNOSIS — Z8619 Personal history of other infectious and parasitic diseases: Secondary | ICD-10-CM

## 2021-05-17 DIAGNOSIS — S299XXA Unspecified injury of thorax, initial encounter: Secondary | ICD-10-CM | POA: Diagnosis not present

## 2021-05-17 DIAGNOSIS — R9431 Abnormal electrocardiogram [ECG] [EKG]: Secondary | ICD-10-CM | POA: Diagnosis not present

## 2021-05-17 DIAGNOSIS — T148XXA Other injury of unspecified body region, initial encounter: Secondary | ICD-10-CM

## 2021-05-17 DIAGNOSIS — S79919A Unspecified injury of unspecified hip, initial encounter: Secondary | ICD-10-CM | POA: Diagnosis not present

## 2021-05-17 DIAGNOSIS — Z90710 Acquired absence of both cervix and uterus: Secondary | ICD-10-CM

## 2021-05-17 DIAGNOSIS — K219 Gastro-esophageal reflux disease without esophagitis: Secondary | ICD-10-CM | POA: Diagnosis present

## 2021-05-17 DIAGNOSIS — Z79899 Other long term (current) drug therapy: Secondary | ICD-10-CM

## 2021-05-17 DIAGNOSIS — M1990 Unspecified osteoarthritis, unspecified site: Secondary | ICD-10-CM | POA: Diagnosis present

## 2021-05-17 DIAGNOSIS — Z20822 Contact with and (suspected) exposure to covid-19: Secondary | ICD-10-CM | POA: Diagnosis not present

## 2021-05-17 DIAGNOSIS — S72002A Fracture of unspecified part of neck of left femur, initial encounter for closed fracture: Secondary | ICD-10-CM | POA: Diagnosis not present

## 2021-05-17 DIAGNOSIS — Z791 Long term (current) use of non-steroidal anti-inflammatories (NSAID): Secondary | ICD-10-CM

## 2021-05-17 DIAGNOSIS — K648 Other hemorrhoids: Secondary | ICD-10-CM | POA: Diagnosis not present

## 2021-05-17 DIAGNOSIS — E559 Vitamin D deficiency, unspecified: Secondary | ICD-10-CM | POA: Diagnosis not present

## 2021-05-17 DIAGNOSIS — S72142A Displaced intertrochanteric fracture of left femur, initial encounter for closed fracture: Secondary | ICD-10-CM | POA: Diagnosis not present

## 2021-05-17 DIAGNOSIS — Z885 Allergy status to narcotic agent status: Secondary | ICD-10-CM

## 2021-05-17 DIAGNOSIS — W010XXA Fall on same level from slipping, tripping and stumbling without subsequent striking against object, initial encounter: Secondary | ICD-10-CM | POA: Diagnosis present

## 2021-05-17 DIAGNOSIS — S0990XA Unspecified injury of head, initial encounter: Secondary | ICD-10-CM | POA: Diagnosis not present

## 2021-05-17 DIAGNOSIS — M419 Scoliosis, unspecified: Secondary | ICD-10-CM | POA: Diagnosis not present

## 2021-05-17 LAB — CBC WITH DIFFERENTIAL/PLATELET
Abs Immature Granulocytes: 0.06 10*3/uL (ref 0.00–0.07)
Basophils Absolute: 0 10*3/uL (ref 0.0–0.1)
Basophils Relative: 0 %
Eosinophils Absolute: 0.1 10*3/uL (ref 0.0–0.5)
Eosinophils Relative: 0 %
HCT: 34 % — ABNORMAL LOW (ref 36.0–46.0)
Hemoglobin: 11.2 g/dL — ABNORMAL LOW (ref 12.0–15.0)
Immature Granulocytes: 1 %
Lymphocytes Relative: 9 %
Lymphs Abs: 1.2 10*3/uL (ref 0.7–4.0)
MCH: 31 pg (ref 26.0–34.0)
MCHC: 32.9 g/dL (ref 30.0–36.0)
MCV: 94.2 fL (ref 80.0–100.0)
Monocytes Absolute: 0.8 10*3/uL (ref 0.1–1.0)
Monocytes Relative: 7 %
Neutro Abs: 10.6 10*3/uL — ABNORMAL HIGH (ref 1.7–7.7)
Neutrophils Relative %: 83 %
Platelets: 279 10*3/uL (ref 150–400)
RBC: 3.61 MIL/uL — ABNORMAL LOW (ref 3.87–5.11)
RDW: 13.9 % (ref 11.5–15.5)
WBC: 12.8 10*3/uL — ABNORMAL HIGH (ref 4.0–10.5)
nRBC: 0 % (ref 0.0–0.2)

## 2021-05-17 LAB — BASIC METABOLIC PANEL
Anion gap: 8 (ref 5–15)
BUN: 19 mg/dL (ref 8–23)
CO2: 27 mmol/L (ref 22–32)
Calcium: 9 mg/dL (ref 8.9–10.3)
Chloride: 100 mmol/L (ref 98–111)
Creatinine, Ser: 0.78 mg/dL (ref 0.44–1.00)
GFR, Estimated: >60 mL/min (ref >60)
Glucose, Bld: 127 mg/dL — ABNORMAL HIGH (ref 70–99)
Potassium: 4.4 mmol/L (ref 3.5–5.1)
Sodium: 135 mmol/L (ref 135–145)

## 2021-05-17 LAB — SURGICAL PCR SCREEN
MRSA, PCR: NEGATIVE
Staphylococcus aureus: NEGATIVE

## 2021-05-17 LAB — PROTIME-INR
INR: 1 (ref 0.8–1.2)
Prothrombin Time: 13.2 seconds (ref 11.4–15.2)

## 2021-05-17 LAB — TYPE AND SCREEN
ABO/RH(D): O POS
Antibody Screen: NEGATIVE

## 2021-05-17 LAB — APTT: aPTT: 28 seconds (ref 24–36)

## 2021-05-17 LAB — RESP PANEL BY RT-PCR (FLU A&B, COVID) ARPGX2
Influenza A by PCR: NEGATIVE
Influenza B by PCR: NEGATIVE
SARS Coronavirus 2 by RT PCR: NEGATIVE

## 2021-05-17 MED ORDER — FENTANYL CITRATE PF 50 MCG/ML IJ SOSY: 50.0000 ug | PREFILLED_SYRINGE | INTRAMUSCULAR | Status: DC | PRN

## 2021-05-17 MED ORDER — LACTATED RINGERS IV BOLUS
1000.0000 mL | Freq: Once | INTRAVENOUS | Status: AC
  Administered 2021-05-17: 1000 mL via INTRAVENOUS

## 2021-05-17 MED ORDER — FENTANYL CITRATE PF 50 MCG/ML IJ SOSY
12.5000 ug | PREFILLED_SYRINGE | INTRAMUSCULAR | Status: DC | PRN
  Administered 2021-05-17 (×2): 12.5 ug via INTRAVENOUS
  Filled 2021-05-17 (×2): qty 1

## 2021-05-17 MED ORDER — MELOXICAM 15 MG PO TABS
15.0000 mg | ORAL_TABLET | Freq: Every day | ORAL | Status: DC
  Administered 2021-05-19 – 2021-05-20 (×2): 15 mg via ORAL
  Filled 2021-05-17 (×3): qty 1

## 2021-05-17 MED ORDER — TRANEXAMIC ACID-NACL 1000-0.7 MG/100ML-% IV SOLN
1000.0000 mg | INTRAVENOUS | Status: AC
  Administered 2021-05-18: 1000 mg via INTRAVENOUS
  Filled 2021-05-17: qty 100

## 2021-05-17 MED ORDER — FENTANYL CITRATE PF 50 MCG/ML IJ SOSY
50.0000 ug | PREFILLED_SYRINGE | INTRAMUSCULAR | Status: DC | PRN
  Administered 2021-05-17: 50 ug via INTRAVENOUS
  Filled 2021-05-17: qty 1

## 2021-05-17 MED ORDER — POVIDONE-IODINE 10 % EX SWAB
2.0000 "application " | CUTANEOUS | Status: AC
  Administered 2021-05-18: 2 via TOPICAL

## 2021-05-17 MED ORDER — LORATADINE 10 MG PO TABS
10.0000 mg | ORAL_TABLET | Freq: Every day | ORAL | Status: DC
  Administered 2021-05-19 – 2021-05-20 (×2): 10 mg via ORAL
  Filled 2021-05-17 (×2): qty 1

## 2021-05-17 MED ORDER — CEFAZOLIN SODIUM-DEXTROSE 2-4 GM/100ML-% IV SOLN
2.0000 g | INTRAVENOUS | Status: AC
  Administered 2021-05-18: 2 g via INTRAVENOUS
  Filled 2021-05-17: qty 100

## 2021-05-17 MED ORDER — PANTOPRAZOLE SODIUM 40 MG PO TBEC
40.0000 mg | DELAYED_RELEASE_TABLET | Freq: Every day | ORAL | Status: DC
  Administered 2021-05-19 – 2021-05-20 (×2): 40 mg via ORAL
  Filled 2021-05-17 (×2): qty 1

## 2021-05-17 MED ORDER — LACTATED RINGERS IV SOLN: INTRAVENOUS | Status: DC

## 2021-05-17 MED ORDER — CHLORHEXIDINE GLUCONATE 4 % EX LIQD: 60.0000 mL | Freq: Once | CUTANEOUS | Status: DC

## 2021-05-17 NOTE — H&P (Addendum)
History and Physical    Linda Watson IOE:[id number] DOB: [birth year] DOA: 05/17/2021  PCP: Christain Sacramento, MD  Patient coming from: Home  Chief Complaint: fall, hip pain  HPI: Linda Watson is a 85 y.o. female with medical history significant of lichen planus on MTX, GERD. Presenting with hip pain after fall. She was cleaning an office space with a vacuum this morning. She was turning around and got tangled up. She fell back onto her left hip. No head injury. No LOC. She remembers the entire fall. She was unable to get up d/t left hip pain. She called family and EMS for help. She denies any other aggravating or alleviating factors.   ED Course: CTH was negative. XR left hip showed minimally displaced left intertrochanteric fracture. Ortho was consulted. [facility] was called for admission.   Review of Systems:  Denies CP, palpitations, dyspnea, abdominal pain, lightheadedness, dizziness, N/V/D, fever, syncopal episodes. Review of systems is otherwise negative for all not mentioned in HPI.   PMHx Past Medical History:  Diagnosis Date   Arthritis    RIGHT KNEE PAIN AND ARTHRITIS   Bee sting    left anterior hand, states she was stung yesterday 03-16-17, only c/o mild pain, apparent redness and edema observed; denies any severe allergy to been venom (ie no tongue swelling or throat closing); RN encouraged pt to see her primary to r/o worsening or s/s of infection ; patient agreeable to this    Fracture, patella    01/21/12 - immobilizer x one month    GERD (gastroesophageal reflux disease)    Hemorrhoids    History of hiatal hernia    History of shingles SEVERAL YRS AGO   1ST TIME LEFT SIDE AND BACK, 2ND TIME REAR END AREA   HNP (herniated nucleus pulposus), lumbar    Lichen planus    LESIONS  IN MOUTH AND OCCASIONALLY GENITAL AREA   Lumbar stenosis    L5-S1   PONV (postoperative nausea and vomiting)    SEVERE PONV WITH KNEE REPLACMENT 2018   Seasonal allergies    Spinal stenosis     PAIN IN LOWER BACK AND DOWN LEFT LEG -ALSO HAS NUMBNESS AND TINGLING IN LEFT LEG TO FOOT    PSHx Past Surgical History:  Procedure Laterality Date   ABDOMINAL HYSTERECTOMY  YRS AGO   COMPLETE    BACK SURGERY  PT NOT SURE WHEN   ONE TIME PREVIOUS BACK SURGERY ALSO    BILATERAL KNEE ARTHROSCOPIES     COLONSCOPY  YRS AGO   HERNIA REPAIR  YRS AGO   UMBILICAL HERNIA   HIATAL HERNIA REPAIR     LEFT SHOULDER SURGERY  YRS AGO   ARTHROSCOPY   LUMBAR LAMINECTOMY/DECOMPRESSION MICRODISCECTOMY Left 11/23/2017   Procedure: Microlumbar decompression Lumbar Five-Sacral One left, Hemilaminectomy Lumbar five.;  Surgeon: Susa Day, MD;  Location: [place];  Service: Orthopedics;  Laterality: Left;   TOTAL KNEE ARTHROPLASTY Right 04/13/2017   Procedure: RIGHT TOTAL KNEE ARTHROPLASTY;  Surgeon: Susa Day, MD;  Location: [facility];  Service: Orthopedics;  Laterality: Right;  120 mins   TOTAL KNEE ARTHROPLASTY Left 05/02/2018   Procedure: LEFT TOTAL KNEE ARTHROPLASTY;  Surgeon: Susa Day, MD;  Location: [facility];  Service: Orthopedics;  Laterality: Left;  120 mins    SocHx  reports that she has never smoked. She has never used smokeless tobacco. She reports that she does not drink alcohol and does not use drugs.  Allergies  Allergen Reactions   Codeine  Nausea Only and Other (See Comments)    Makes pt crazy     FamHx History reviewed. No pertinent family history.  Prior to Admission medications   Medication Sig Start Date End Date Taking? Authorizing Provider  acetaminophen (TYLENOL) 500 MG tablet Take 1,000 mg by mouth at bedtime.   Yes [provider]  Biotin 1000 MCG tablet Take 1,000 mcg by mouth daily.   Yes [provider]  fexofenadine (ALLEGRA) 180 MG tablet Take 180 mg by mouth daily.   Yes [provider]  meloxicam (MOBIC) 15 MG tablet Take 15 mg by mouth daily. 01/20/21  Yes [provider]  methotrexate (RHEUMATREX) 2.5 MG tablet Take 5 mg by  mouth once a week. Saturday 04/23/21  Yes [provider]  pantoprazole (PROTONIX) 40 MG tablet Take 40 mg by mouth daily before breakfast.    Yes [provider]  Polyethyl Glycol-Propyl Glycol (SYSTANE ULTRA OP) Place 1 drop into both eyes daily as needed (dry eyes).   Yes [provider]  polyethylene glycol (MIRALAX / GLYCOLAX) packet Take 17 g by mouth daily as needed for mild constipation. 05/02/18  Yes Lacie Draft M, PA-C  valACYclovir (VALTREX) 500 MG tablet Take 500 mg by mouth daily as needed (flare up).   Yes [provider]  vitamin B-12 (CYANOCOBALAMIN) 1000 MCG tablet Take 1,000 mcg by mouth daily.   Yes [provider]    Physical Exam: Vitals:   05/17/21 1100 05/17/21 1130 05/17/21 1145 05/17/21 1200  BP: 125/74 112/75 107/72 (!) 107/55  Pulse: 77 88 78 80  Resp: 15 14 13 15   Temp:      SpO2: 97% 96% 97% 98%  Weight:      Height:        General: 85 y.o. female resting in bed in NAD Eyes: PERRL, normal sclera ENMT: Nares patent w/o discharge, dentition normal, ears w/o discharge/lesions/ulcers Neck: Supple, trachea midline Cardiovascular: RRR, +S1, S2, no m/g/r, equal pulses throughout Respiratory: CTABL, no w/r/r, normal WOB GI: BS+, NDNT, no masses noted, no organomegaly noted MSK: No e/c/c; left hip limited ROM d/t pain Skin: No rashes, bruises, ulcerations noted Neuro: A&O x 3, no focal deficits Psyc: Appropriate interaction and affect, calm/cooperative  Labs on Admission: I have personally reviewed following labs and imaging studies  CBC: Recent Labs  Lab 05/17/21 1030  WBC 12.8*  NEUTROABS 10.6*  HGB 11.2*  HCT 34.0*  MCV 94.2  PLT 562   Basic Metabolic Panel: Recent Labs  Lab 05/17/21 1030  NA 135  K 4.4  CL 100  CO2 27  GLUCOSE 127*  BUN 19  CREATININE 0.78  CALCIUM 9.0   GFR: Estimated Creatinine Clearance: 44.1 mL/min (by C-G formula based on SCr of 0.78 mg/dL). Liver Function Tests: No  results for input(s): AST, ALT, ALKPHOS, BILITOT, PROT, ALBUMIN in the last 168 hours. No results for input(s): LIPASE, AMYLASE in the last 168 hours. No results for input(s): AMMONIA in the last 168 hours. Coagulation Profile: Recent Labs  Lab 05/17/21 1030  INR 1.0   Cardiac Enzymes: No results for input(s): CKTOTAL, CKMB, CKMBINDEX, TROPONINI in the last 168 hours. BNP (last 3 results) No results for input(s): PROBNP in the last 8760 hours. HbA1C: No results for input(s): HGBA1C in the last 72 hours. CBG: No results for input(s): GLUCAP in the last 168 hours. Lipid Profile: No results for input(s): CHOL, HDL, LDLCALC, TRIG, CHOLHDL, LDLDIRECT in the last 72 hours. Thyroid Function Tests:  No results for input(s): TSH, T4TOTAL, FREET4, T3FREE, THYROIDAB in the last 72 hours. Anemia Panel: No results for input(s): VITAMINB12, FOLATE, FERRITIN, TIBC, IRON, RETICCTPCT in the last 72 hours. Urine analysis:    Component Value Date/Time   COLORURINE STRAW (A) 04/27/2018 0902   APPEARANCEUR CLEAR 04/27/2018 0902   LABSPEC 1.004 (L) 04/27/2018 0902   PHURINE 6.0 04/27/2018 0902   GLUCOSEU NEGATIVE 04/27/2018 0902   HGBUR SMALL (A) 04/27/2018 0902   BILIRUBINUR NEGATIVE 04/27/2018 0902   KETONESUR NEGATIVE 04/27/2018 0902   PROTEINUR NEGATIVE 04/27/2018 0902   UROBILINOGEN 0.2 03/21/2012 0806   NITRITE NEGATIVE 04/27/2018 0902   LEUKOCYTESUR MODERATE (A) 04/27/2018 0902    Radiological Exams on Admission: DG Chest 1 View  Result Date: 05/17/2021 CLINICAL DATA:  Fall, trauma EXAM: CHEST  1 VIEW COMPARISON:  None. FINDINGS: The heart size and mediastinal contours are within normal limits. Chronic interstitial changes. No pleural effusion. No acute osseous abnormality. Dextroscoliosis. IMPRESSION: No acute process in the chest. Electronically Signed   By: Macy Mis M.D.   On: 05/17/2021 11:04   CT HEAD WO CONTRAST (5MM)  Result Date: 05/17/2021 CLINICAL DATA:  Trauma EXAM: CT  HEAD WITHOUT CONTRAST TECHNIQUE: Contiguous axial images were obtained from the base of the skull through the vertex without intravenous contrast. COMPARISON:  None. FINDINGS: Brain: There is no acute intracranial hemorrhage, extra-axial fluid collection, or acute infarct. There is mild parenchymal volume loss and chronic white matter microangiopathy. The ventricles are not enlarged. There is no mass lesion. There is no midline shift. Vascular: No hyperdense vessel or unexpected calcification. Skull: Normal. Negative for fracture or focal lesion. Sinuses/Orbits: The imaged paranasal sinuses are clear. Bilateral lens implants are in place. The globes and orbits are otherwise unremarkable. Other: There is degenerative change at the temporomandibular joints. IMPRESSION: No acute intracranial hemorrhage or calvarial fracture. Electronically Signed   By: Valetta Mole M.D.   On: 05/17/2021 10:53   DG Hip Unilat With Pelvis 2-3 Views Left  Result Date: 05/17/2021 CLINICAL DATA:  Hip pain EXAM: DG HIP (WITH OR WITHOUT PELVIS) 2-3V LEFT COMPARISON:  None. FINDINGS: There is a minimally displaced left intertrochanteric fracture. Femoroacetabular alignment is maintained. There is no fracture on the right. The SI joints and symphysis pubis are intact. IMPRESSION: Minimally displaced left intertrochanteric fracture. Electronically Signed   By: Valetta Mole M.D.   On: 05/17/2021 10:48    EKG: Independently reviewed. Sinus, no st elevation  Assessment/Plan Left hip fracture     - admit to inpt, med-surg     - [facility] onboard, appreciate assistance; working out OR schedule     - PT/OT after surgery; TOC consult     - pain control     - SCDs for PPx  Lichen planus    - continue home regimen  GERD     - PPI  DVT prophylaxis: SCD  Code Status: DNI  Family Communication: None at bedside  Consults called: EDP consulted ortho   Status is: Inpatient  Remains inpatient appropriate because:Inpatient level  of care appropriate due to severity of illness  Dispo: The patient is from: Home              Anticipated d/c is to: Home              Patient currently is not medically stable to d/c.   Difficult to place patient No  Time spent coordinating admission: 45 minutes  [place]  [facility]  If 7PM-7AM, please contact night-coverage [email or web address]  05/17/2021, 12:12 PM

## 2021-05-17 NOTE — Consult Note (Signed)
Pt with L hip IT fx.  Plan OR tomorrow.  Orders entered.  OK to eat tonight.  Pls hold blood thinners.  Full consult note to follow.

## 2021-05-17 NOTE — ED Provider Notes (Addendum)
[facility] DEPT Provider Note   CSN: [id number] Arrival date & time: 05/17/21  [id number]     History Chief Complaint  Patient presents with   Hip Pain    fa   Fall    Linda Watson is a 85 y.o. female.  HPI    85 year old comes in with chief complaint of mechanical fall. Patient is a pleasant woman with no significant medical history.  She has had bilateral knee replacement completed by Dr. Freeman Caldron.  She indicates that at her church today, she was vacuuming and had a mechanical fall.  She struck the back of her head and started having immediate left hip pain.  She was unable to get up.  Pain is moderately severe with some numbness in her foot.  Denies any severe headaches and patient is not on any blood thinners.  Past Medical History:  Diagnosis Date   Arthritis    RIGHT KNEE PAIN AND ARTHRITIS   Bee sting    left anterior hand, states she was stung yesterday 03-16-17, only c/o mild pain, apparent redness and edema observed; denies any severe allergy to been venom (ie no tongue swelling or throat closing); RN encouraged pt to see her primary to r/o worsening or s/s of infection ; patient agreeable to this    Fracture, patella    01/21/12 - immobilizer x one month    GERD (gastroesophageal reflux disease)    Hemorrhoids    History of hiatal hernia    History of shingles SEVERAL YRS AGO   1ST TIME LEFT SIDE AND BACK, 2ND TIME REAR END AREA   HNP (herniated nucleus pulposus), lumbar    Lichen planus    LESIONS  IN MOUTH AND OCCASIONALLY GENITAL AREA   Lumbar stenosis    L5-S1   PONV (postoperative nausea and vomiting)    SEVERE PONV WITH KNEE REPLACMENT 2018   Seasonal allergies    Spinal stenosis    PAIN IN LOWER BACK AND DOWN LEFT LEG -ALSO HAS NUMBNESS AND TINGLING IN LEFT LEG TO FOOT    Patient Active Problem List   Diagnosis Date Noted   Closed left hip fracture ([place]) 05/17/2021   Primary osteoarthritis of left knee 05/02/2018   Left  knee DJD 05/02/2018   Spinal stenosis, lumbar 11/23/2017   Primary osteoarthritis of right knee 04/13/2017   Right knee DJD 04/13/2017   Lumbar spinal stenosis 03/21/2012    Past Surgical History:  Procedure Laterality Date   ABDOMINAL HYSTERECTOMY  YRS AGO   COMPLETE    BACK SURGERY  PT NOT SURE WHEN   ONE TIME PREVIOUS BACK SURGERY ALSO    BILATERAL KNEE ARTHROSCOPIES     COLONSCOPY  YRS AGO   HERNIA REPAIR  YRS AGO   UMBILICAL HERNIA   HIATAL HERNIA REPAIR     LEFT SHOULDER SURGERY  YRS AGO   ARTHROSCOPY   LUMBAR LAMINECTOMY/DECOMPRESSION MICRODISCECTOMY Left 11/23/2017   Procedure: Microlumbar decompression Lumbar Five-Sacral One left, Hemilaminectomy Lumbar five.;  Surgeon: Susa Day, MD;  Location: [facility];  Service: Orthopedics;  Laterality: Left;   TOTAL KNEE ARTHROPLASTY Right 04/13/2017   Procedure: RIGHT TOTAL KNEE ARTHROPLASTY;  Surgeon: Susa Day, MD;  Location: [facility];  Service: Orthopedics;  Laterality: Right;  120 mins   TOTAL KNEE ARTHROPLASTY Left 05/02/2018   Procedure: LEFT TOTAL KNEE ARTHROPLASTY;  Surgeon: Susa Day, MD;  Location: [facility];  Service: Orthopedics;  Laterality: Left;  120 mins     OB  History   No obstetric history on file.     History reviewed. No pertinent family history.  Social History   Tobacco Use   Smoking status: Never   Smokeless tobacco: Never  Vaping Use   Vaping Use: Never used  Substance Use Topics   Alcohol use: Never   Drug use: Never    Home Medications Prior to Admission medications   Medication Sig Start Date End Date Taking? Authorizing Provider  acetaminophen (TYLENOL) 500 MG tablet Take 1,000 mg by mouth at bedtime.   Yes [provider]  Biotin 1000 MCG tablet Take 1,000 mcg by mouth daily.   Yes [provider]  fexofenadine (ALLEGRA) 180 MG tablet Take 180 mg by mouth daily.   Yes [provider]  meloxicam (MOBIC) 15 MG tablet Take 15 mg by mouth daily. 01/20/21  Yes  [provider]  methotrexate (RHEUMATREX) 2.5 MG tablet Take 5 mg by mouth once a week. Saturday 04/23/21  Yes [provider]  pantoprazole (PROTONIX) 40 MG tablet Take 40 mg by mouth daily before breakfast.    Yes [provider]  Polyethyl Glycol-Propyl Glycol (SYSTANE ULTRA OP) Place 1 drop into both eyes daily as needed (dry eyes).   Yes [provider]  polyethylene glycol (MIRALAX / GLYCOLAX) packet Take 17 g by mouth daily as needed for mild constipation. 05/02/18  Yes Lacie Draft M, PA-C  valACYclovir (VALTREX) 500 MG tablet Take 500 mg by mouth daily as needed (flare up).   Yes [provider]  vitamin B-12 (CYANOCOBALAMIN) 1000 MCG tablet Take 1,000 mcg by mouth daily.   Yes [provider]    Allergies    Codeine  Review of Systems   Review of Systems  Constitutional:  Positive for activity change.  Gastrointestinal:  Negative for nausea and vomiting.  Musculoskeletal:  Positive for arthralgias and myalgias.  Neurological:  Negative for headaches.  Hematological:  Does not bruise/bleed easily.  All other systems reviewed and are negative.  Physical Exam Updated Vital Signs BP 115/64 (BP Location: Left Arm)   Pulse 69   Temp 98.4 F (36.9 C) (Oral)   Resp 16   Ht '5\' 3"'$  (1.6 m)   Wt 56.7 kg   SpO2 95%   BMI 22.14 kg/m   Physical Exam Vitals and nursing note reviewed.  Constitutional:      Appearance: She is well-developed.  HENT:     Head: Atraumatic.  Neck:     Comments: No midline c-spine tenderness, pt able to turn head to 45 degrees bilaterally without any pain and able to flex neck to the chest and extend without any pain or neurologic symptoms.  Cardiovascular:     Rate and Rhythm: Normal rate.  Pulmonary:     Effort: Pulmonary effort is normal.  Musculoskeletal:        General: Swelling, tenderness and deformity present.     Cervical back: Normal range of motion and neck supple.     Comments:  Left hip tenderness, left lower extremity shortened  Skin:    General: Skin is warm and dry.  Neurological:     Mental Status: She is alert and oriented to person, place, and time.    ED Results / Procedures / Treatments   Labs (all labs ordered are listed, but only abnormal results are displayed) Labs Reviewed  BASIC METABOLIC PANEL - Abnormal; Notable for the following components:      Result Value   Glucose, Bld 127 (*)  All other components within normal limits  CBC WITH DIFFERENTIAL/PLATELET - Abnormal; Notable for the following components:   WBC 12.8 (*)    RBC 3.61 (*)    Hemoglobin 11.2 (*)    HCT 34.0 (*)    Neutro Abs 10.6 (*)    All other components within normal limits  VITAMIN D 25 HYDROXY (VIT D DEFICIENCY, FRACTURES) - Abnormal; Notable for the following components:   Vit D, 25-Hydroxy 20.38 (*)    All other components within normal limits  RESP PANEL BY RT-PCR (FLU A&B, COVID) [id number]  SURGICAL PCR SCREEN  PROTIME-INR  APTT  TYPE AND SCREEN    EKG None  Radiology DG Chest 1 View  Result Date: 05/17/2021 CLINICAL DATA:  Fall, trauma EXAM: CHEST  1 VIEW COMPARISON:  None. FINDINGS: The heart size and mediastinal contours are within normal limits. Chronic interstitial changes. No pleural effusion. No acute osseous abnormality. Dextroscoliosis. IMPRESSION: No acute process in the chest. Electronically Signed   By: Macy Mis M.D.   On: 05/17/2021 11:04   CT HEAD WO CONTRAST (5MM)  Result Date: 05/17/2021 CLINICAL DATA:  Trauma EXAM: CT HEAD WITHOUT CONTRAST TECHNIQUE: Contiguous axial images were obtained from the base of the skull through the vertex without intravenous contrast. COMPARISON:  None. FINDINGS: Brain: There is no acute intracranial hemorrhage, extra-axial fluid collection, or acute infarct. There is mild parenchymal volume loss and chronic white matter microangiopathy. The ventricles are not enlarged. There is no mass lesion. There is no  midline shift. Vascular: No hyperdense vessel or unexpected calcification. Skull: Normal. Negative for fracture or focal lesion. Sinuses/Orbits: The imaged paranasal sinuses are clear. Bilateral lens implants are in place. The globes and orbits are otherwise unremarkable. Other: There is degenerative change at the temporomandibular joints. IMPRESSION: No acute intracranial hemorrhage or calvarial fracture. Electronically Signed   By: Valetta Mole M.D.   On: 05/17/2021 10:53   DG Hip Unilat With Pelvis 2-3 Views Left  Result Date: 05/17/2021 CLINICAL DATA:  Hip pain EXAM: DG HIP (WITH OR WITHOUT PELVIS) 2-3V LEFT COMPARISON:  None. FINDINGS: There is a minimally displaced left intertrochanteric fracture. Femoroacetabular alignment is maintained. There is no fracture on the right. The SI joints and symphysis pubis are intact. IMPRESSION: Minimally displaced left intertrochanteric fracture. Electronically Signed   By: Valetta Mole M.D.   On: 05/17/2021 10:48    Procedures Procedures   Medications Ordered in ED Medications  pantoprazole (PROTONIX) EC tablet 40 mg (40 mg Oral Not Given 05/18/21 0817)  meloxicam (MOBIC) tablet 15 mg (15 mg Oral Not Given 05/18/21 0816)  loratadine (CLARITIN) tablet 10 mg (10 mg Oral Not Given 05/18/21 0816)  fentaNYL (SUBLIMAZE) injection 12.5 mcg (12.5 mcg Intravenous Given 05/17/21 2053)  chlorhexidine (HIBICLENS) 4 % liquid 4 application (has no administration in time range)  povidone-iodine 10 % swab 2 application (has no administration in time range)  lactated ringers infusion ( Intravenous Transfusing/Transfer 05/18/21 0917)  ceFAZolin (ANCEF) IVPB 2g/100 mL premix (has no administration in time range)  tranexamic acid (CYKLOKAPRON) IVPB 1,000 mg (has no administration in time range)  lactated ringers bolus 1,000 mL (0 mLs Intravenous Stopped 05/17/21 1704)    ED Course  I have reviewed the triage vital signs and the nursing notes.  Pertinent labs & imaging  results that were available during my care of the patient were reviewed by me and considered in my medical decision making (see chart for details).    MDM Rules/Calculators/A&P  85 year old comes in with chief complaint of fall. Mechanical fall with trauma to the head, minor and significant discomfort over the left hip.  C-spine cleared clinically.  CT head ordered given the trauma to it to ensure there is no brain bleed.  X-ray of the chest, hip also ordered. Marland Kitchen 9:27 AM Left hip is broken.  We will consult Dr. Freeman Caldron, orthopedist. Will also consult medicine for admission.  In the afternoon, spoke with Dr. Doran Durand from orthopedic service.  They are aware. Final Clinical Impression(s) / ED Diagnoses Final diagnoses:  Closed fracture of left hip, initial encounter [facility])    Rx / [facility] Orders ED Discharge Orders     None        Varney Biles, MD 05/18/21 [id number]    Varney Biles, MD 05/18/21 0930

## 2021-05-17 NOTE — ED Triage Notes (Signed)
Pt BIB [facility] from a church d/t pt falling while cleaning. Pt states she fell on her left side, shortening and rotation noted to left leg. Pt denies any previous history to left hip. Pt was given 121mg of Fentanyl by EMS. Pt is A&Ox4. Pt denies hitting head, LOC, or taking blood thinners.

## 2021-05-17 NOTE — ED Notes (Signed)
Son, Linda Watson notified that pt will be admitted to room 1327.

## 2021-05-17 NOTE — Consult Note (Signed)
Reason for Consult:left hip pain Referring Physician: Dr. Antonieta Pert is an 85 y.o. female.  HPI: 85 y/o female without significant PMH fell yesterday injuring her left hip.  She was seen in the ER where xrays reveal a displaced left hip intertroch fracture.  She c/o aching pain in the left hip that is worse with motion and better with rest.  She has had knee replacements by Dr. Tonita Cong.  She does not take any blood thinners.  She is not a smoker.  Past Medical History:  Diagnosis Date   Arthritis    RIGHT KNEE PAIN AND ARTHRITIS   Bee sting    left anterior hand, states she was stung yesterday 03-16-17, only c/o mild pain, apparent redness and edema observed; denies any severe allergy to been venom (ie no tongue swelling or throat closing); RN encouraged pt to see her primary to r/o worsening or s/s of infection ; patient agreeable to this    Fracture, patella    01/21/12 - immobilizer x one month    GERD (gastroesophageal reflux disease)    Hemorrhoids    History of hiatal hernia    History of shingles SEVERAL YRS AGO   1ST TIME LEFT SIDE AND BACK, 2ND TIME REAR END AREA   HNP (herniated nucleus pulposus), lumbar    Lichen planus    LESIONS  IN MOUTH AND OCCASIONALLY GENITAL AREA   Lumbar stenosis    L5-S1   PONV (postoperative nausea and vomiting)    SEVERE PONV WITH KNEE REPLACMENT 2018   Seasonal allergies    Spinal stenosis    PAIN IN LOWER BACK AND DOWN LEFT LEG -ALSO HAS NUMBNESS AND TINGLING IN LEFT LEG TO FOOT    Past Surgical History:  Procedure Laterality Date   ABDOMINAL HYSTERECTOMY  YRS AGO   COMPLETE    BACK SURGERY  PT NOT SURE WHEN   ONE TIME PREVIOUS BACK SURGERY ALSO    BILATERAL KNEE ARTHROSCOPIES     COLONSCOPY  YRS AGO   HERNIA REPAIR  YRS AGO   UMBILICAL HERNIA   HIATAL HERNIA REPAIR     LEFT SHOULDER SURGERY  YRS AGO   ARTHROSCOPY   LUMBAR LAMINECTOMY/DECOMPRESSION MICRODISCECTOMY Left 11/23/2017   Procedure: Microlumbar decompression  Lumbar Five-Sacral One left, Hemilaminectomy Lumbar five.;  Surgeon: Susa Day, MD;  Location: [facility];  Service: Orthopedics;  Laterality: Left;   TOTAL KNEE ARTHROPLASTY Right 04/13/2017   Procedure: RIGHT TOTAL KNEE ARTHROPLASTY;  Surgeon: Susa Day, MD;  Location[facility];  Service: Orthopedics;  Laterality: Right;  120 mins   TOTAL KNEE ARTHROPLASTY Left 05/02/2018   Procedure: LEFT TOTAL KNEE ARTHROPLASTY;  Surgeon: Susa Day, MD;  Location: [facility];  Service: Orthopedics;  Laterality: Left;  120 mins    No family history on file.  Social History:  reports that she has never smoked. She has never used smokeless tobacco. She reports that she does not drink alcohol and does not use drugs.  Allergies:  Allergies  Allergen Reactions   Codeine Nausea Only and Other (See Comments)    Makes pt crazy     Medications: I have reviewed the patient's current medications.  Results for orders placed or performed during the hospital encounter of 05/17/21 (from the past 48 hour(s))  Basic metabolic panel     Status: Abnormal   Collection Time: 05/17/21 10:30 AM  Result Value Ref Range   Sodium 135 135 - 145 mmol/L   Potassium 4.4 3.5 -  5.1 mmol/L   Chloride 100 98 - 111 mmol/L   CO2 27 22 - 32 mmol/L   Glucose, Bld 127 (H) 70 - 99 mg/dL    Comment: Glucose reference range applies only to samples taken after fasting for at least 8 hours.   BUN 19 8 - 23 mg/dL   Creatinine, Ser 0.78 0.44 - 1.00 mg/dL   Calcium 9.0 8.9 - 10.3 mg/dL   GFR, Estimated >60 >60 mL/min    Comment: (NOTE) Calculated using the CKD-EPI Creatinine Equation (2021)    Anion gap 8 5 - 15    Comment: Performed at [facility], [place] [address] [place], [place] [address]  CBC WITH DIFFERENTIAL     Status: Abnormal   Collection Time: 05/17/21 10:30 AM  Result Value Ref Range   WBC 12.8 (H) 4.0 - 10.5 K/uL   RBC 3.61 (L) 3.87 - 5.11 MIL/uL   Hemoglobin 11.2 (L) 12.0 - 15.0 g/dL   HCT 34.0 (L)  36.0 - 46.0 %   MCV 94.2 80.0 - 100.0 fL   MCH 31.0 26.0 - 34.0 pg   MCHC 32.9 30.0 - 36.0 g/dL   RDW 13.9 11.5 - 15.5 %   Platelets 279 150 - 400 K/uL   nRBC 0.0 0.0 - 0.2 %   Neutrophils Relative % 83 %   Neutro Abs 10.6 (H) 1.7 - 7.7 K/uL   Lymphocytes Relative 9 %   Lymphs Abs 1.2 0.7 - 4.0 K/uL   Monocytes Relative 7 %   Monocytes Absolute 0.8 0.1 - 1.0 K/uL   Eosinophils Relative 0 %   Eosinophils Absolute 0.1 0.0 - 0.5 K/uL   Basophils Relative 0 %   Basophils Absolute 0.0 0.0 - 0.1 K/uL   Immature Granulocytes 1 %   Abs Immature Granulocytes 0.06 0.00 - 0.07 K/uL    Comment: Performed at [facility], [place] [address], [place], [place] [address]  Protime-INR     Status: None   Collection Time: 05/17/21 10:30 AM  Result Value Ref Range   Prothrombin Time 13.2 11.4 - 15.2 seconds   INR 1.0 0.8 - 1.2    Comment: (NOTE) INR goal varies based on device and disease states. Performed at [facility], [place] [address] [place], [place] [address]   Type and screen [place]     Status: None   Collection Time: 05/17/21 10:30 AM  Result Value Ref Range   ABO/RH(D) O POS    Antibody Screen NEG    Sample Expiration      05/20/2021,2359 Performed at [facility], [place] [address], [place], [place] [address]   APTT     Status: None   Collection Time: 05/17/21 10:30 AM  Result Value Ref Range   aPTT 28 24 - 36 seconds    Comment: Performed at [facility], [place] [address], [place], [place] [address]  Resp Panel by RT-PCR (Flu A&B, Covid) Nasopharyngeal Swab     Status: None   Collection Time: 05/17/21 10:30 AM   Specimen: Nasopharyngeal Swab; Nasopharyngeal(NP) swabs in vial transport medium  Result Value Ref Range   SARS Coronavirus 2 by RT PCR NEGATIVE NEGATIVE    Comment: (NOTE) SARS-CoV-2 target nucleic acids are NOT DETECTED.  The SARS-CoV-2 RNA is generally detectable in upper  respiratory specimens during the acute phase of infection. The lowest concentration of SARS-CoV-2 viral copies this assay can detect is 138 copies/mL. A negative  result does not preclude SARS-Cov-2 infection and should not be used as the sole basis for treatment or other patient management decisions. A negative result may occur with  improper specimen collection/handling, submission of specimen other than nasopharyngeal swab, presence of viral mutation(s) within the areas targeted by this assay, and inadequate number of viral copies(<138 copies/mL). A negative result must be combined with clinical observations, patient history, and epidemiological information. The expected result is Negative.  Fact Sheet for Patients:  EntrepreneurPulse.com.au  Fact Sheet for Healthcare Providers:  IncredibleEmployment.be  This test is no t yet approved or cleared by the [place] FDA and  has been authorized for detection and/or diagnosis of SARS-CoV-2 by FDA under an Emergency Use Authorization (EUA). This EUA will remain  in effect (meaning this test can be used) for the duration of the COVID-19 declaration under Section 564(b)(1) of the Act, 21 U.S.C.section 360bbb-3(b)(1), unless the authorization is terminated  or revoked sooner.       Influenza A by PCR NEGATIVE NEGATIVE   Influenza B by PCR NEGATIVE NEGATIVE    Comment: (NOTE) The Xpert Xpress SARS-CoV-2/FLU/RSV plus assay is intended as an aid in the diagnosis of influenza from Nasopharyngeal swab specimens and should not be used as a sole basis for treatment. Nasal washings and aspirates are unacceptable for Xpert Xpress SARS-CoV-2/FLU/RSV testing.  Fact Sheet for Patients: EntrepreneurPulse.com.au  Fact Sheet for Healthcare Providers: IncredibleEmployment.be  This test is not yet approved or cleared by the [place] FDA and has been authorized for  detection and/or diagnosis of SARS-CoV-2 by FDA under an Emergency Use Authorization (EUA). This EUA will remain in effect (meaning this test can be used) for the duration of the COVID-19 declaration under Section 564(b)(1) of the Act, 21 U.S.C. section 360bbb-3(b)(1), unless the authorization is terminated or revoked.  Performed at [facility], [place] [address], [place], [place] [address]     DG Chest 1 View  Result Date: 05/17/2021 CLINICAL DATA:  Fall, trauma EXAM: CHEST  1 VIEW COMPARISON:  None. FINDINGS: The heart size and mediastinal contours are within normal limits. Chronic interstitial changes. No pleural effusion. No acute osseous abnormality. Dextroscoliosis. IMPRESSION: No acute process in the chest. Electronically Signed   By: Macy Mis M.D.   On: 05/17/2021 11:04   CT HEAD WO CONTRAST (5MM)  Result Date: 05/17/2021 CLINICAL DATA:  Trauma EXAM: CT HEAD WITHOUT CONTRAST TECHNIQUE: Contiguous axial images were obtained from the base of the skull through the vertex without intravenous contrast. COMPARISON:  None. FINDINGS: Brain: There is no acute intracranial hemorrhage, extra-axial fluid collection, or acute infarct. There is mild parenchymal volume loss and chronic white matter microangiopathy. The ventricles are not enlarged. There is no mass lesion. There is no midline shift. Vascular: No hyperdense vessel or unexpected calcification. Skull: Normal. Negative for fracture or focal lesion. Sinuses/Orbits: The imaged paranasal sinuses are clear. Bilateral lens implants are in place. The globes and orbits are otherwise unremarkable. Other: There is degenerative change at the temporomandibular joints. IMPRESSION: No acute intracranial hemorrhage or calvarial fracture. Electronically Signed   By: Valetta Mole M.D.   On: 05/17/2021 10:53   DG Hip Unilat With Pelvis 2-3 Views Left  Result Date: 05/17/2021 CLINICAL DATA:  Hip pain EXAM: DG HIP (WITH OR WITHOUT  PELVIS) 2-3V LEFT COMPARISON:  None. FINDINGS: There is a minimally displaced left intertrochanteric fracture. Femoroacetabular alignment is maintained. There is no fracture on the right. The SI joints and symphysis pubis are  intact. IMPRESSION: Minimally displaced left intertrochanteric fracture. Electronically Signed   By: Valetta Mole M.D.   On: May 25, 2021 10:48    ROS:  no recent f/c/n/v/wt loss.  10 system review is o/w negative. PE:  There were no vitals taken for this visit. 5 wd elderly female in nad.  A and O x4.  Eomi.  Resp unlabored.  L LE shortened and ext rotated.  Intact sens to LT at the foot.  Brisk cap refill at the left foot.  Pain with IR and ER at the L LE.  Assessment/Plan: L hip intertroch fracture.  To the OR for open treatment with IM nailing.  The risks and benefits of the alternative treatment options have been discussed in detail.  The patient wishes to proceed with surgery and specifically understands risks of bleeding, infection, nerve damage, blood clots, need for additional surgery, amputation and death.   Wylene Simmer 25-May-2021, 6:09 PM

## 2021-05-17 NOTE — ED Notes (Signed)
Dr. Nanavati at bedside 

## 2021-05-18 ENCOUNTER — Inpatient Hospital Stay (HOSPITAL_COMMUNITY): Payer: PPO

## 2021-05-18 ENCOUNTER — Inpatient Hospital Stay (HOSPITAL_COMMUNITY): Payer: PPO | Admitting: Anesthesiology

## 2021-05-18 ENCOUNTER — Inpatient Hospital Stay (HOSPITAL_COMMUNITY): Admission: RE | Admit: 2021-05-18 | Payer: PPO | Source: Home / Self Care | Admitting: Orthopedic Surgery

## 2021-05-18 ENCOUNTER — Encounter (HOSPITAL_COMMUNITY): Payer: Self-pay | Admitting: Internal Medicine

## 2021-05-18 ENCOUNTER — Encounter (HOSPITAL_COMMUNITY)
Admission: EM | Disposition: A | Discharge status: Home Health | Payer: Self-pay | Source: Home / Self Care | Attending: Internal Medicine

## 2021-05-18 DIAGNOSIS — S72002A Fracture of unspecified part of neck of left femur, initial encounter for closed fracture: Secondary | ICD-10-CM | POA: Diagnosis not present

## 2021-05-18 HISTORY — PX: FEMUR IM NAIL: SHX1597

## 2021-05-18 LAB — CBC
HCT: 29.1 % — ABNORMAL LOW (ref 36.0–46.0)
Hemoglobin: 9.6 g/dL — ABNORMAL LOW (ref 12.0–15.0)
MCH: 30.5 pg (ref 26.0–34.0)
MCHC: 33 g/dL (ref 30.0–36.0)
MCV: 92.4 fL (ref 80.0–100.0)
Platelets: 226 10*3/uL (ref 150–400)
RBC: 3.15 MIL/uL — ABNORMAL LOW (ref 3.87–5.11)
RDW: 14.2 % (ref 11.5–15.5)
WBC: 11.9 10*3/uL — ABNORMAL HIGH (ref 4.0–10.5)
nRBC: 0 % (ref 0.0–0.2)

## 2021-05-18 LAB — CREATININE, SERUM
Creatinine, Ser: 0.69 mg/dL (ref 0.44–1.00)
GFR, Estimated: >60 mL/min (ref >60)

## 2021-05-18 LAB — VITAMIN D 25 HYDROXY (VIT D DEFICIENCY, FRACTURES): Vit D, 25-Hydroxy: 20.38 ng/mL — ABNORMAL LOW (ref 30–100)

## 2021-05-18 SURGERY — INSERTION, INTRAMEDULLARY ROD, FEMUR
Anesthesia: General | Site: Hip | Laterality: Left

## 2021-05-18 MED ORDER — ONDANSETRON HCL 4 MG/2ML IJ SOLN: 4.0000 mg | Freq: Four times a day (QID) | INTRAMUSCULAR | Status: DC | PRN

## 2021-05-18 MED ORDER — MENTHOL 3 MG MT LOZG: 1.0000 | LOZENGE | OROMUCOSAL | Status: DC | PRN

## 2021-05-18 MED ORDER — ROCURONIUM BROMIDE 10 MG/ML (PF) SYRINGE
PREFILLED_SYRINGE | INTRAVENOUS | Status: DC | PRN
  Administered 2021-05-18: 40 mg via INTRAVENOUS

## 2021-05-18 MED ORDER — ENOXAPARIN SODIUM 40 MG/0.4ML IJ SOSY
40.0000 mg | PREFILLED_SYRINGE | INTRAMUSCULAR | Status: DC
  Administered 2021-05-19 – 2021-05-20 (×2): 40 mg via SUBCUTANEOUS
  Filled 2021-05-18 (×2): qty 0.4

## 2021-05-18 MED ORDER — LIDOCAINE HCL (PF) 2 % IJ SOLN
INTRAMUSCULAR | Status: AC
  Filled 2021-05-18: qty 5

## 2021-05-18 MED ORDER — FENTANYL CITRATE (PF) 250 MCG/5ML IJ SOLN
INTRAMUSCULAR | Status: DC | PRN
  Administered 2021-05-18 (×2): 25 ug via INTRAVENOUS
  Administered 2021-05-18: 50 ug via INTRAVENOUS

## 2021-05-18 MED ORDER — SORBITOL 70 % SOLN
30.0000 mL | Freq: Every day | Status: DC | PRN
  Filled 2021-05-18: qty 30

## 2021-05-18 MED ORDER — DEXAMETHASONE SODIUM PHOSPHATE 10 MG/ML IJ SOLN
INTRAMUSCULAR | Status: AC
  Filled 2021-05-18: qty 1

## 2021-05-18 MED ORDER — DEXAMETHASONE SODIUM PHOSPHATE 10 MG/ML IJ SOLN
INTRAMUSCULAR | Status: DC | PRN
  Administered 2021-05-18: 10 mg via INTRAVENOUS

## 2021-05-18 MED ORDER — METOCLOPRAMIDE HCL 5 MG/ML IJ SOLN: 5.0000 mg | Freq: Three times a day (TID) | INTRAMUSCULAR | Status: DC | PRN

## 2021-05-18 MED ORDER — HYDROCODONE-ACETAMINOPHEN 7.5-325 MG PO TABS: 1.0000 | ORAL_TABLET | ORAL | Status: DC | PRN

## 2021-05-18 MED ORDER — ONDANSETRON HCL 4 MG/2ML IJ SOLN
INTRAMUSCULAR | Status: DC | PRN
  Administered 2021-05-18: 4 mg via INTRAVENOUS

## 2021-05-18 MED ORDER — ONDANSETRON HCL 4 MG PO TABS: 4.0000 mg | ORAL_TABLET | Freq: Four times a day (QID) | ORAL | Status: DC | PRN

## 2021-05-18 MED ORDER — FENTANYL CITRATE PF 50 MCG/ML IJ SOSY: 25.0000 ug | PREFILLED_SYRINGE | INTRAMUSCULAR | Status: DC | PRN

## 2021-05-18 MED ORDER — PHENOL 1.4 % MT LIQD: 1.0000 | OROMUCOSAL | Status: DC | PRN

## 2021-05-18 MED ORDER — MORPHINE SULFATE (PF) 2 MG/ML IV SOLN: 0.5000 mg | INTRAVENOUS | Status: DC | PRN

## 2021-05-18 MED ORDER — PHENYLEPHRINE 40 MCG/ML (10ML) SYRINGE FOR IV PUSH (FOR BLOOD PRESSURE SUPPORT)
PREFILLED_SYRINGE | INTRAVENOUS | Status: DC | PRN
  Administered 2021-05-18 (×4): 80 ug via INTRAVENOUS

## 2021-05-18 MED ORDER — POLYETHYLENE GLYCOL 3350 17 G PO PACK: 17.0000 g | PACK | Freq: Every day | ORAL | Status: DC | PRN

## 2021-05-18 MED ORDER — KCL IN DEXTROSE-NACL 20-5-0.45 MEQ/L-%-% IV SOLN
INTRAVENOUS | Status: DC
  Filled 2021-05-18 (×2): qty 1000

## 2021-05-18 MED ORDER — METOCLOPRAMIDE HCL 5 MG PO TABS: 5.0000 mg | ORAL_TABLET | Freq: Three times a day (TID) | ORAL | Status: DC | PRN

## 2021-05-18 MED ORDER — ACETAMINOPHEN 325 MG PO TABS
325.0000 mg | ORAL_TABLET | Freq: Four times a day (QID) | ORAL | Status: DC | PRN
  Administered 2021-05-20: 650 mg via ORAL
  Filled 2021-05-18: qty 2

## 2021-05-18 MED ORDER — SUGAMMADEX SODIUM 200 MG/2ML IV SOLN
INTRAVENOUS | Status: DC | PRN
  Administered 2021-05-18: 125 mg via INTRAVENOUS

## 2021-05-18 MED ORDER — ENSURE SURGERY PO LIQD: 237.0000 mL | Freq: Two times a day (BID) | ORAL | Status: DC

## 2021-05-18 MED ORDER — DOCUSATE SODIUM 100 MG PO CAPS
100.0000 mg | ORAL_CAPSULE | Freq: Two times a day (BID) | ORAL | Status: DC
  Administered 2021-05-19 – 2021-05-20 (×4): 100 mg via ORAL
  Filled 2021-05-18 (×4): qty 1

## 2021-05-18 MED ORDER — AMISULPRIDE (ANTIEMETIC) 5 MG/2ML IV SOLN: 10.0000 mg | Freq: Once | INTRAVENOUS | Status: DC | PRN

## 2021-05-18 MED ORDER — FENTANYL CITRATE (PF) 100 MCG/2ML IJ SOLN
INTRAMUSCULAR | Status: AC
  Filled 2021-05-18: qty 2

## 2021-05-18 MED ORDER — FLEET ENEMA 7-19 GM/118ML RE ENEM
1.0000 | ENEMA | Freq: Once | RECTAL | Status: DC | PRN
Start: 2021-05-18 — End: 2021-05-20

## 2021-05-18 MED ORDER — ROCURONIUM BROMIDE 10 MG/ML (PF) SYRINGE
PREFILLED_SYRINGE | INTRAVENOUS | Status: AC
  Filled 2021-05-18: qty 10

## 2021-05-18 MED ORDER — ACETAMINOPHEN 10 MG/ML IV SOLN: 1000.0000 mg | Freq: Once | INTRAVENOUS | Status: DC | PRN

## 2021-05-18 MED ORDER — PROPOFOL 10 MG/ML IV BOLUS
INTRAVENOUS | Status: DC | PRN
  Administered 2021-05-18: 100 mg via INTRAVENOUS

## 2021-05-18 MED ORDER — LIDOCAINE 2% (20 MG/ML) 5 ML SYRINGE
INTRAMUSCULAR | Status: DC | PRN
  Administered 2021-05-18: 60 mg via INTRAVENOUS

## 2021-05-18 MED ORDER — PROPOFOL 10 MG/ML IV BOLUS
INTRAVENOUS | Status: AC
  Filled 2021-05-18: qty 20

## 2021-05-18 MED ORDER — ONDANSETRON HCL 4 MG/2ML IJ SOLN
INTRAMUSCULAR | Status: AC
  Filled 2021-05-18: qty 2

## 2021-05-18 MED ORDER — APREPITANT 40 MG PO CAPS
40.0000 mg | ORAL_CAPSULE | Freq: Once | ORAL | Status: AC
  Administered 2021-05-18: 40 mg via ORAL
  Filled 2021-05-18: qty 1

## 2021-05-18 MED ORDER — 0.9 % SODIUM CHLORIDE (POUR BTL) OPTIME
TOPICAL | Status: DC | PRN
  Administered 2021-05-18: 1000 mL

## 2021-05-18 MED ORDER — TRANEXAMIC ACID-NACL 1000-0.7 MG/100ML-% IV SOLN
1000.0000 mg | Freq: Once | INTRAVENOUS | Status: AC
  Administered 2021-05-18: 1000 mg via INTRAVENOUS
  Filled 2021-05-18: qty 100

## 2021-05-18 MED ORDER — HYDROCODONE-ACETAMINOPHEN 5-325 MG PO TABS
1.0000 | ORAL_TABLET | ORAL | Status: DC | PRN
  Administered 2021-05-18: 2 via ORAL
  Administered 2021-05-19: 1 via ORAL
  Filled 2021-05-18: qty 2
  Filled 2021-05-18: qty 1
  Filled 2021-05-18: qty 2

## 2021-05-18 SURGICAL SUPPLY — 40 items
BAG COUNTER SPONGE SURGICOUNT (BAG) IMPLANT
BAG SPEC THK2 15X12 ZIP CLS (MISCELLANEOUS)
BAG SPNG CNTER NS LX DISP (BAG)
BAG ZIPLOCK 12X15 (MISCELLANEOUS) ×1 IMPLANT
BIT DRILL CANN LG 4.3MM (BIT) IMPLANT
BNDG ELASTIC 6X5.8 VLCR STR LF (GAUZE/BANDAGES/DRESSINGS) ×1 IMPLANT
BNDG GAUZE ELAST 4 BULKY (GAUZE/BANDAGES/DRESSINGS) ×2 IMPLANT
COVER SURGICAL LIGHT HANDLE (MISCELLANEOUS) ×2 IMPLANT
DRAPE STERI IOBAN 125X83 (DRAPES) ×2 IMPLANT
DRESSING MEPILEX FLEX 4X4 (GAUZE/BANDAGES/DRESSINGS) ×1 IMPLANT
DRILL BIT CANN LG 4.3MM (BIT) ×2
DRSG EMULSION OIL 3X16 NADH (GAUZE/BANDAGES/DRESSINGS) ×1 IMPLANT
DRSG MEPILEX BORDER 4X8 (GAUZE/BANDAGES/DRESSINGS) ×2 IMPLANT
DRSG MEPILEX FLEX 4X4 (GAUZE/BANDAGES/DRESSINGS) ×2
DRSG PAD ABDOMINAL 8X10 ST (GAUZE/BANDAGES/DRESSINGS) ×1 IMPLANT
DURAPREP 26ML APPLICATOR (WOUND CARE) ×2 IMPLANT
ELECT REM PT RETURN 15FT ADLT (MISCELLANEOUS) ×2 IMPLANT
GLOVE SURG ENC MOIS LTX SZ7.5 (GLOVE) ×2 IMPLANT
GLOVE SURG ENC MOIS LTX SZ8 (GLOVE) ×2 IMPLANT
GLOVE SURG UNDER LTX SZ8 (GLOVE) ×2 IMPLANT
GOWN STRL REUS W/TWL LRG LVL3 (GOWN DISPOSABLE) ×2 IMPLANT
GUIDEPIN VERSANAIL DSP 3.2X444 (ORTHOPEDIC DISPOSABLE SUPPLIES) ×1 IMPLANT
HFN 130 DEG 13MM X 180MM (Nail) ×1 IMPLANT
KIT BASIN OR (CUSTOM PROCEDURE TRAY) ×2 IMPLANT
KIT TURNOVER KIT A (KITS) ×2 IMPLANT
NS IRRIG 1000ML POUR BTL (IV SOLUTION) ×1 IMPLANT
PACK GENERAL/GYN (CUSTOM PROCEDURE TRAY) ×2 IMPLANT
PAD CAST 4YDX4 CTTN HI CHSV (CAST SUPPLIES) ×1 IMPLANT
PADDING CAST COTTON 4X4 STRL (CAST SUPPLIES)
PROTECTOR NERVE ULNAR (MISCELLANEOUS) ×2 IMPLANT
SCREW BONE CORTICAL 5.0X32 (Screw) ×1 IMPLANT
SCREW LAG 10.5MMX105MM HFN (Screw) ×1 IMPLANT
STAPLER VISISTAT 35W (STAPLE) ×1 IMPLANT
SUT ETHILON 3 0 PS 1 (SUTURE) ×1 IMPLANT
SUT VIC AB 0 CT1 27 (SUTURE) ×6
SUT VIC AB 0 CT1 27XBRD ANTBC (SUTURE) ×3 IMPLANT
SUT VIC AB 2-0 CT1 27 (SUTURE) ×4
SUT VIC AB 2-0 CT1 27XBRD (SUTURE) ×2 IMPLANT
TOWEL OR 17X26 10 PK STRL BLUE (TOWEL DISPOSABLE) ×3 IMPLANT
WATER STERILE IRR 1000ML POUR (IV SOLUTION) ×1 IMPLANT

## 2021-05-18 NOTE — Plan of Care (Signed)
Plan of care reviewed and discussed with the patient. 

## 2021-05-18 NOTE — Op Note (Signed)
05/18/2021  1:59 PM  PATIENT:  Linda Watson  85 y.o. female  PRE-OPERATIVE DIAGNOSIS: Left hip intertrochanteric fracture  POST-OPERATIVE DIAGNOSIS: Same  Procedure(s): Open treatment of left hip intertrochanteric fracture with intramedullary nailing  SURGEON:  Wylene Simmer, MD  ASSISTANT: None  ANESTHESIA:  General  EBL: 100 cc  TOURNIQUET: None  COMPLICATIONS:  None apparent  DISPOSITION:  Extubated, awake and stable to recovery.  INDICATION FOR PROCEDURE: The patient is an 85 year old female who was in her usual state of good health until yesterday when she fell injuring her left hip.  X-rays in the emergency department reveal an intertrochanteric fracture that is displaced.  She presents now for operative treatment of this displaced and unstable left hip injury.  The risks and benefits of the alternative treatment options have been discussed in detail.  The patient wishes to proceed with surgery and specifically understands risks of bleeding, infection, nerve damage, blood clots, need for additional surgery, amputation and death.   PROCEDURE IN DETAIL: After preoperative consent was obtained and the correct operative site was identified, the patient was brought the operating room supine on a stretcher.  General anesthesia was induced.  Preoperative antibiotics and tranexamic acid were administered.  The patient was then moved onto the [place] table.  The left lower extremity was positioned in a traction boot.  The right lower extremity was positioned in a well leg holder.  The left lower extremity was then reduced.  Abduction and external rotation were achieved followed by gentle traction.  The extremity was then internally rotated and abducted.  AP and lateral radiographs confirmed appropriate reduction of the fracture.  The left lower extremity was then prepped and draped in standard sterile fashion with a shower curtain.  A longitudinal incision was made just proximal to the tip of  the greater trochanter.  Sharp dissection was carried down through the subcutaneous tissues.  The gluteal fascia was incised.  Blunt dissection was carried through the muscle fibers to the tip of the greater trochanter.  A guidepin was inserted in line with the medullary canal.  AP and lateral radiographs confirmed appropriate position of the guidepin.  It was then overdrilled with an entry reamer and removed.  A short 13 mm [employer] affixes nail was inserted using the targeting guide.  It was seated appropriately.  The targeting guide was used to insert a guidepin into the center of the femoral head.  AP and lateral radiographs confirmed appropriate position of the guidepin.  It was then overdrilled.  A 105 mm lag screw was inserted and compressed appropriately.  The locking feature at the proximal end of the nail was then tightened securely and backed off a quarter turn to allow the nail to slide but not rotate.  The targeting guide was then used to insert a distal interlock screw in bicortical fashion.  AP and lateral radiographs were obtained along the length of the nail showing appropriate reduction of the fracture in appropriate position and length of all hardware.  The wounds were irrigated copiously.  Gluteal fascia was repaired with 0 Vicryl.  Subcutaneous tissues were approximated with 2-0 Monocryl.  The skin incisions were closed with 3-0 nylon.  Sterile dressings were applied.  Patient was awakened from anesthesia and transported to the recovery room in stable condition.  FOLLOW UP PLAN: Weightbearing as tolerated on the left lower extremity.  Xarelto for DVT prophylaxis.  Follow-up in the office in 2 to 3 weeks for suture removal.

## 2021-05-18 NOTE — Transfer of Care (Signed)
Immediate Anesthesia Transfer of Care Note  Patient: Linda Watson  Procedure(s) Performed: INTRAMEDULLARY (IM) NAIL FEMORAL (Left: Hip)  Patient Location: PACU  Anesthesia Type:General  Level of Consciousness: awake, alert  and oriented  Airway & Oxygen Therapy: Patient Spontanous Breathing and Patient connected to face mask oxygen  Post-op Assessment: Report given to RN and Post -op Vital signs reviewed and stable  Post vital signs: Reviewed and stable  Last Vitals:  Vitals Value Taken Time  BP 142/88 05/18/21 1401  Temp    Pulse 70 05/18/21 1404  Resp 15 05/18/21 1404  SpO2 79 % 05/18/21 1404  Vitals shown include unvalidated device data.  Last Pain:  Vitals:   05/18/21 1101  TempSrc: Oral  PainSc:       Patients Stated Pain Goal: 2 (16/10/96 0454)  Complications: No notable events documented.

## 2021-05-18 NOTE — Progress Notes (Signed)
Initial Nutrition Assessment  DOCUMENTATION CODES:   Not applicable  INTERVENTION:  - diet advancement as medically feasible.  - will order Ensure Surgery BID, each supplement provides 350 kcal and 20 grams of protein.   NUTRITION DIAGNOSIS:   Increased nutrient needs related to hip fracture, post-op healing as evidenced by estimated needs.  GOAL:   Patient will meet greater than or equal to 90% of their needs  MONITOR:   Diet advancement, PO intake, Supplement acceptance, Labs, Weight trends, Skin  REASON FOR ASSESSMENT:   Consult Hip fracture protocol  ASSESSMENT:   85 y.o. female with medical history of lichen planus on MTX, GERD, spinal stenosis, arthritis, and lumbar stenosis. She presented to the ED after a fall onto her L hip. No head injury or LOC.  Diet advanced from NPO to Regular yesterday at [place] and then changed back to NPO at midnight. No dinner intake documented in the flow sheet.   Patient has not been seen by a [place] RD at any time in the past.   She denies any changes in appetite recently and denies any recent weight loss.   Weight yesterday was documented as 125 lb, which appears to be a stated weight. PTA the most recently documented weight was 123 lb at [place] on 03/24/21. Prior to that, most recent weight was 136 lb at [facility] on 04/27/18.   Patient admitted for L hip IT fracture and Ortho is following with plan to take patient to the OR today.    Labs reviewed. Medications reviewed; 40 mg oral protonix/day. IVF; LR @ 50 ml/hr.    NUTRITION - FOCUSED PHYSICAL EXAM:  No muscle or fat depletions.   Diet Order:   Diet Order             Diet NPO time specified  Diet effective ____                   EDUCATION NEEDS:   No education needs have been identified at this time  Skin:  Skin Assessment: Reviewed RN Assessment  Last BM:  PTA/unknown  Height:   Ht Readings from Last 1 Encounters:  05/17/21  5\' 3"  (1.6 m)    Weight:   Wt Readings from Last 1 Encounters:  05/17/21 56.7 kg     Estimated Nutritional Needs:  Kcal:  1585-1815 kcal Protein:  80-90 grams Fluid:  >/= 1.7 L/day      Jarome Matin, MS, RD, LDN, CNSC Inpatient Clinical Dietitian RD pager # available in [facility]  After hours/weekend pager # available in [facility]

## 2021-05-18 NOTE — Anesthesia Preprocedure Evaluation (Addendum)
Anesthesia Evaluation  Patient identified by MRN, date of birth, ID band Patient awake    Reviewed: Allergy & Precautions, NPO status , Patient's Chart, lab work & pertinent test results  History of Anesthesia Complications (+) PONV  Airway Mallampati: II  TM Distance: >3 FB Neck ROM: Full    Dental  (+) Upper Dentures   Pulmonary neg pulmonary ROS,    Pulmonary exam normal        Cardiovascular negative cardio ROS   Rhythm:Regular Rate:Normal     Neuro/Psych negative neurological ROS  negative psych ROS   GI/Hepatic Neg liver ROS, hiatal hernia, GERD  Medicated,  Endo/Other  negative endocrine ROS  Renal/GU negative Renal ROS  negative genitourinary   Musculoskeletal  (+) Arthritis , Osteoarthritis,  S/p left intertrochanteric fx 2/2 fall    Abdominal (+)  Abdomen: soft.    Peds  Hematology negative hematology ROS (+)   Anesthesia Other Findings   Reproductive/Obstetrics                            Anesthesia Physical Anesthesia Plan  ASA: 2  Anesthesia Plan: General   Post-op Pain Management:    Induction: Intravenous  PONV Risk Score and Plan: 4 or greater and Ondansetron, Dexamethasone, Aprepitant, Treatment may vary due to age or medical condition and Amisulpride  Airway Management Planned: Mask and Oral ETT  Additional Equipment: None  Intra-op Plan:   Post-operative Plan: Extubation in OR  Informed Consent: I have reviewed the patients History and Physical, chart, labs and discussed the procedure including the risks, benefits and alternatives for the proposed anesthesia with the patient or authorized representative who has indicated his/her understanding and acceptance.     Dental advisory given  Plan Discussed with: CRNA  Anesthesia Plan Comments: (Lab Results      Component                Value               Date                      WBC                       12.8 (H)            05/17/2021                HGB                      11.2 (L)            05/17/2021                HCT                      34.0 (L)            05/17/2021                MCV                      94.2                05/17/2021                PLT  279                 05/17/2021           Lab Results      Component                Value               Date                      NA                       135                 05/17/2021                K                        4.4                 05/17/2021                CO2                      27                  05/17/2021                GLUCOSE                  127 (H)             05/17/2021                BUN                      19                  05/17/2021                CREATININE               0.78                05/17/2021                CALCIUM                  9.0                 05/17/2021                GFRNONAA                 >60                 05/17/2021                GFRAA                    >60                 05/03/2018          )        Anesthesia Quick Evaluation

## 2021-05-18 NOTE — Progress Notes (Signed)
PROGRESS NOTE  Linda Watson  DOB: 1936/03/17  PCP: Christain Sacramento, MD DGU:[id number]  DOA: 05/17/2021  LOS: 1 day  Hospital Day: 2   Chief Complaint  Patient presents with   Hip Pain    fa   Fall    Brief narrative: Linda Watson is a 85 y.o. female with PMH significant for lichen planus on MTX, GERD.  Patient presented to the ED on 9/19 after a fall and pain in left hip.  While vacuuming the floor at her [employer], she got entangled on its wire. In the ED, vital signs stable CT head was normal. X-ray of the left hip showed minimally displaced left intertrochanteric femur fracture. Orthopedics was consulted. Admitted to hospitalist service.  Subjective: Patient was seen and examined this afternoon postsurgically. Pleasant elderly Caucasian female.  Propped up in bed.  Not in distress.  On low-flow oxygen for comfort. Pain controlled Family at bedside.  Assessment/Plan: Acute displaced left intertrochanteric femur fracture -Secondary to mechanical fall -Orthopedic consult appreciated.  Patient underwent intramedullary nailing today.  No Intra-Op or immediate postop complication. -Pain management and DVT prophylaxis per orthopedic team  Lichen planus -Continue methotrexate weekly.    GERD -Continue PPI  Mobility: PT eval pending Code Status:   Code Status: Partial Code  Nutritional status: Body mass index is 22.14 kg/m. Nutrition Problem: Increased nutrient needs Etiology: hip fracture, post-op healing Signs/Symptoms: estimated needs Diet:  Diet Order             Diet regular Room service appropriate? Yes; Fluid consistency: Thin  Diet effective now                  DVT prophylaxis:  enoxaparin (LOVENOX) injection 40 mg Start: 05/19/21 0800 SCDs Start: 05/18/21 1524 SCDs Start: 05/17/21 1511   Antimicrobials: None Fluid: D5 half NS at 50 mill per hour Consultants: Orthopedics Family Communication: Family at bedside  Status is:  Inpatient  Remains inpatient appropriate because: POD 0  Dispo: The patient is from: Home              Anticipated d/c is to: Patient lives alone at home and has 13 stairs.  She is hoping to go to a rehab.  Pending PT eval              Patient currently is not medically stable to d/c.   Difficult to place patient No     Infusions:   dextrose 5 % and 0.45 % NaCl with KCl 20 mEq/L 50 mL/hr at 05/18/21 1631   tranexamic acid      Scheduled Meds:  docusate sodium  100 mg Oral BID   [START ON 05/19/2021] enoxaparin (LOVENOX) injection  40 mg Subcutaneous Q24H   feeding supplement  237 mL Oral BID BM   loratadine  10 mg Oral Daily   meloxicam  15 mg Oral Daily   pantoprazole  40 mg Oral QAC breakfast    Antimicrobials: Anti-infectives (From admission, onward)    Start     Dose/Rate Route Frequency Ordered Stop   05/18/21 0600  ceFAZolin (ANCEF) IVPB 2g/100 mL premix        2 g 200 mL/hr over 30 Minutes Intravenous On call to O.R. 05/17/21 1830 05/18/21 1256       PRN meds: [START ON 05/19/2021] acetaminophen, fentaNYL (SUBLIMAZE) injection, HYDROcodone-acetaminophen, HYDROcodone-acetaminophen, menthol-cetylpyridinium **OR** phenol, metoCLOPramide **OR** metoCLOPramide (REGLAN) injection, morphine injection, ondansetron **OR** ondansetron (ZOFRAN) IV, polyethylene glycol, sodium phosphate, sorbitol   Objective:  Vitals:   05/18/21 1515 05/18/21 1528  BP: 122/66 120/77  Pulse: 71 78  Resp: 16 16  Temp:  98.8 F (37.1 C)  SpO2: 100% 99%    Intake/Output Summary (Last 24 hours) at 05/18/2021 1646 Last data filed at 05/18/2021 1600 Gross per 24 hour  Intake 2501.85 ml  Output 800 ml  Net 1701.85 ml   Filed Weights   05/17/21 0939 05/17/21 1737  Weight: 56.7 kg 56.7 kg   Weight change:  Body mass index is 22.14 kg/m.   Physical Exam: General exam: Pleasant, elderly Caucasian female.  Not in distress at this time Skin: No rashes, lesions or ulcers. HEENT:  Atraumatic, normocephalic, no obvious bleeding Lungs: Clear to auscultation bilaterally CVS: Regular rate and rhythm, no murmur GI/Abd soft, nontender, nondistended, bowel sound present CNS: Alert, awake, oriented x3 Psychiatry: Mood appropriate Extremities: No pedal edema, no calf tenderness  Data Review: I have personally reviewed the laboratory data and studies available.  Recent Labs  Lab 05/17/21 1030  WBC 12.8*  NEUTROABS 10.6*  HGB 11.2*  HCT 34.0*  MCV 94.2  PLT 279   Recent Labs  Lab 05/17/21 1030  NA 135  K 4.4  CL 100  CO2 27  GLUCOSE 127*  BUN 19  CREATININE 0.78  CALCIUM 9.0    F/u labs ordered Unresulted Labs (From admission, onward)     Start     Ordered   05/25/21 0500  Creatinine, serum  (enoxaparin (LOVENOX)  CrCl >/= 30 mL/min  )  Weekly,   R     Comments: while on enoxaparin therapy.    05/18/21 1523   05/19/21 0500  CBC  Daily,   R      05/18/21 1523   05/19/21 2505  Basic metabolic panel  Daily,   R      05/18/21 1523   05/18/21 1524  CBC  (enoxaparin (LOVENOX)  CrCl >/= 30 mL/min  )  Once,   R       Comments: Baseline for enoxaparin therapy IF NOT ALREADY DRAWN. Notify MD if PLT < 100 K.    05/18/21 1523   05/18/21 1524  Creatinine, serum  (enoxaparin (LOVENOX)  CrCl >/= 30 mL/min  )  Once,   R       Comments: Baseline for enoxaparin therapy IF NOT ALREADY DRAWN.    05/18/21 1523            Signed, Terrilee Croak, MD [facility] 05/18/2021

## 2021-05-18 NOTE — Anesthesia Postprocedure Evaluation (Signed)
Anesthesia Post Note  Patient: Linda Watson  Procedure(s) Performed: INTRAMEDULLARY (IM) NAIL FEMORAL (Left: Hip)     Patient location during evaluation: PACU Anesthesia Type: General Level of consciousness: awake and alert Pain management: pain level controlled Vital Signs Assessment: post-procedure vital signs reviewed and stable Respiratory status: spontaneous breathing, nonlabored ventilation, respiratory function stable and patient connected to nasal cannula oxygen Cardiovascular status: blood pressure returned to baseline and stable Postop Assessment: no apparent nausea or vomiting Anesthetic complications: no   No notable events documented.  Last Vitals:  Vitals:   05/18/21 1515 05/18/21 1528  BP: 122/66 120/77  Pulse: 71 78  Resp: 16 16  Temp:  37.1 C  SpO2: 100% 99%    Last Pain:  Vitals:   05/18/21 1623  TempSrc:   PainSc: 3                  Daira Hine P Myrlene Riera

## 2021-05-18 NOTE — Anesthesia Procedure Notes (Signed)
Procedure Name: Intubation Date/Time: 05/18/2021 12:54 PM Performed by: Julia Kulzer D, CRNA Pre-anesthesia Checklist: Patient identified, Emergency Drugs available, Suction available and Patient being monitored Patient Re-evaluated:Patient Re-evaluated prior to induction Oxygen Delivery Method: Circle system utilized Preoxygenation: Pre-oxygenation with 100% oxygen Induction Type: IV induction Ventilation: Mask ventilation without difficulty Laryngoscope Size: Mac and 3 Grade View: Grade I Tube type: Oral Tube size: 7.0 mm Number of attempts: 1 Airway Equipment and Method: Stylet Placement Confirmation: ETT inserted through vocal cords under direct vision, positive ETCO2 and breath sounds checked- equal and bilateral Secured at: 20 cm Tube secured with: Tape Dental Injury: Teeth and Oropharynx as per pre-operative assessment

## 2021-05-19 DIAGNOSIS — S72002A Fracture of unspecified part of neck of left femur, initial encounter for closed fracture: Secondary | ICD-10-CM | POA: Diagnosis not present

## 2021-05-19 LAB — BASIC METABOLIC PANEL
Anion gap: 6 (ref 5–15)
BUN: 14 mg/dL (ref 8–23)
CO2: 28 mmol/L (ref 22–32)
Calcium: 8.6 mg/dL — ABNORMAL LOW (ref 8.9–10.3)
Chloride: 99 mmol/L (ref 98–111)
Creatinine, Ser: 0.78 mg/dL (ref 0.44–1.00)
GFR, Estimated: >60 mL/min (ref >60)
Glucose, Bld: 151 mg/dL — ABNORMAL HIGH (ref 70–99)
Potassium: 4.9 mmol/L (ref 3.5–5.1)
Sodium: 133 mmol/L — ABNORMAL LOW (ref 135–145)

## 2021-05-19 LAB — CBC
HCT: 27.3 % — ABNORMAL LOW (ref 36.0–46.0)
Hemoglobin: 9 g/dL — ABNORMAL LOW (ref 12.0–15.0)
MCH: 30.3 pg (ref 26.0–34.0)
MCHC: 33 g/dL (ref 30.0–36.0)
MCV: 91.9 fL (ref 80.0–100.0)
Platelets: 193 10*3/uL (ref 150–400)
RBC: 2.97 MIL/uL — ABNORMAL LOW (ref 3.87–5.11)
RDW: 13.8 % (ref 11.5–15.5)
WBC: 11.3 10*3/uL — ABNORMAL HIGH (ref 4.0–10.5)
nRBC: 0 % (ref 0.0–0.2)

## 2021-05-19 MED ORDER — SENNA 8.6 MG PO TABS: 2.0000 | ORAL_TABLET | Freq: Two times a day (BID) | ORAL | 0 refills | Status: AC

## 2021-05-19 MED ORDER — DOCUSATE SODIUM 100 MG PO CAPS: 100.0000 mg | ORAL_CAPSULE | Freq: Two times a day (BID) | ORAL | 0 refills | Status: AC

## 2021-05-19 MED ORDER — RIVAROXABAN 10 MG PO TABS: 10.0000 mg | ORAL_TABLET | Freq: Every day | ORAL | 0 refills | Status: AC

## 2021-05-19 MED ORDER — CALCIUM CARBONATE-VITAMIN D 500-200 MG-UNIT PO TABS
1.0000 | ORAL_TABLET | Freq: Every day | ORAL | Status: DC
  Administered 2021-05-19 – 2021-05-20 (×2): 1 via ORAL
  Filled 2021-05-19 (×2): qty 1

## 2021-05-19 MED ORDER — HYDROCODONE-ACETAMINOPHEN 5-325 MG PO TABS: 1.0000 | ORAL_TABLET | Freq: Four times a day (QID) | ORAL | 0 refills | Status: AC | PRN

## 2021-05-19 MED ORDER — ONDANSETRON HCL 4 MG PO TABS: 4.0000 mg | ORAL_TABLET | Freq: Every day | ORAL | 0 refills | Status: AC | PRN

## 2021-05-19 NOTE — Discharge Instructions (Signed)
Wylene Simmer, MD [facility]  Please read the following information regarding your care after surgery.  Medications  You only need a prescription for the narcotic pain medicine (ex. oxycodone, Percocet, Norco).  All of the other medicines listed below are available over the counter. X Aleve 2 pills twice a day for the first 3 days after surgery. X hydrocodone as prescribed for severe pain  Narcotic pain medicine (ex. oxycodone, Percocet, Vicodin) will cause constipation.  To prevent this problem, take the following medicines while you are taking any pain medicine. X docusate sodium (Colace) 100 mg twice a day X senna (Senokot) 2 tablets twice a day X To help prevent blood clots, take Xarelto as prescribed for two weeks after surgery.  You should also get up every hour while you are awake to move around.    Weight Bearing X Bear weight when you are able on your operated leg or foot with rolling walker.   Cast / Splint / Dressing X Keep your splint, cast or dressing clean and dry.  Don't put anything (coat hanger, pencil, etc) down inside of it.  If it gets damp, use a hair dryer on the cool setting to dry it.  If it gets soaked, call the office to schedule an appointment for a cast change.   After your dressing, cast or splint is removed; you may shower, but do not soak or scrub the wound.  Allow the water to run over it, and then gently pat it dry.  Swelling It is normal for you to have swelling where you had surgery.  To reduce swelling and pain, keep your toes above your nose for at least 3 days after surgery.  It may be necessary to keep your foot or leg elevated for several weeks.  If it hurts, it should be elevated.  Follow Up Call my office at [phone] when you are discharged from the hospital or surgery center to schedule an appointment to be seen two weeks after surgery.  Call my office at [phone] if you develop a fever >101.5 F, nausea, vomiting, bleeding from the  surgical site or severe pain.

## 2021-05-19 NOTE — Progress Notes (Signed)
PROGRESS NOTE  Linda Watson  DOB: 06/13/1936  PCP: Christain Sacramento, MD ZOX:[id number]  DOA: 05/17/2021  LOS: 2 days  Hospital Day: 3   Chief Complaint  Patient presents with   Hip Pain    fa   Fall    Brief narrative: Linda Watson is a 85 y.o. female with PMH significant for lichen planus on MTX, GERD.  Patient presented to the ED on 9/19 after a fall and pain in left hip.  While vacuuming the floor at her [employer], she got entangled on its wire. In the ED, vital signs stable CT head was normal. X-ray of the left hip showed minimally displaced left intertrochanteric femur fracture. Orthopedics was consulted. Admitted to hospitalist service.  Subjective: Patient was seen and examined this morning.  Pleasant elderly Caucasian female.  No pain last night. Foley out.  Not on supplemental oxygen today. Patient is waiting for physical therapy evaluation.  PT eval was obtained later in the day.  Home health PT recommended.  Assessment/Plan: Acute displaced left intertrochanteric femur fracture -Secondary to mechanical fall -Orthopedic consult appreciated.  Patient underwent intramedullary nailing on 9/20.  Uneventful postop status. -Pain management and DVT prophylaxis per orthopedic team  Lichen planus -Continue methotrexate weekly.    Vitamin D deficiency -Started on calcium and vitamin D supplement.  GERD -Continue PPI  Mobility: PT eval appreciated Code Status:   Code Status: Partial Code  Nutritional status: Body mass index is 22.14 kg/m. Nutrition Problem: Increased nutrient needs Etiology: hip fracture, post-op healing Signs/Symptoms: estimated needs Diet:  Diet Order             Diet regular Room service appropriate? Yes; Fluid consistency: Thin  Diet effective now                  DVT prophylaxis:  enoxaparin (LOVENOX) injection 40 mg Start: 05/19/21 0800 SCDs Start: 05/18/21 1524 SCDs Start: 05/17/21 1511   Antimicrobials: None Fluid: D5  half NS at 50 mill per hour Consultants: Orthopedics Family Communication: Family at bedside  Status is: Inpatient  Remains inpatient appropriate because: POD 0  Dispo: The patient is from: Home              Anticipated d/c is to: Home with home health PT.  Patient's son is setting up arrangements at home today.  She wants to be discharged tomorrow morning.              Patient currently is medically stable to d/c.   Difficult to place patient No     Infusions:     Scheduled Meds:  calcium-vitamin D  1 tablet Oral Q breakfast   docusate sodium  100 mg Oral BID   enoxaparin (LOVENOX) injection  40 mg Subcutaneous Q24H   feeding supplement  237 mL Oral BID BM   loratadine  10 mg Oral Daily   meloxicam  15 mg Oral Daily   pantoprazole  40 mg Oral QAC breakfast    Antimicrobials: Anti-infectives (From admission, onward)    Start     Dose/Rate Route Frequency Ordered Stop   05/18/21 0600  ceFAZolin (ANCEF) IVPB 2g/100 mL premix        2 g 200 mL/hr over 30 Minutes Intravenous On call to O.R. 05/17/21 1830 05/18/21 1256       PRN meds: acetaminophen, fentaNYL (SUBLIMAZE) injection, HYDROcodone-acetaminophen, HYDROcodone-acetaminophen, menthol-cetylpyridinium **OR** phenol, metoCLOPramide **OR** metoCLOPramide (REGLAN) injection, morphine injection, ondansetron **OR** ondansetron (ZOFRAN) IV, polyethylene glycol, sodium phosphate,  sorbitol   Objective: Vitals:   05/19/21 0532 05/19/21 1048  BP: 135/63 (!) 100/54  Pulse: 68 72  Resp: 17   Temp: 97.7 F (36.5 C) 98.6 F (37 C)  SpO2: 100% 99%    Intake/Output Summary (Last 24 hours) at 05/19/2021 1259 Last data filed at 05/19/2021 1100 Gross per 24 hour  Intake 2915.01 ml  Output 2300 ml  Net 615.01 ml   Filed Weights   05/17/21 0939 05/17/21 1737  Weight: 56.7 kg 56.7 kg   Weight change:  Body mass index is 22.14 kg/m.   Physical Exam: General exam: Pleasant, elderly Caucasian female.  Not in distress at  this time Skin: No rashes, lesions or ulcers. HEENT: Atraumatic, normocephalic, no obvious bleeding Lungs: Clear to auscultation bilaterally CVS: Regular rate and rhythm, no murmur GI/Abd soft, nontender, nondistended, bowel sound present CNS: Alert, awake, oriented x3 Psychiatry: Mood appropriate Extremities: No pedal edema, no calf tenderness  Data Review: I have personally reviewed the laboratory data and studies available.  Recent Labs  Lab 05/17/21 1030 05/18/21 1640 05/19/21 0307  WBC 12.8* 11.9* 11.3*  NEUTROABS 10.6*  --   --   HGB 11.2* 9.6* 9.0*  HCT 34.0* 29.1* 27.3*  MCV 94.2 92.4 91.9  PLT 279 226 193   Recent Labs  Lab 05/17/21 1030 05/18/21 1640 05/19/21 0307  NA 135  --  133*  K 4.4  --  4.9  CL 100  --  99  CO2 27  --  28  GLUCOSE 127*  --  151*  BUN 19  --  14  CREATININE 0.78 0.69 0.78  CALCIUM 9.0  --  8.6*    F/u labs ordered Unresulted Labs (From admission, onward)     Start     Ordered   05/25/21 0500  Creatinine, serum  (enoxaparin (LOVENOX)  CrCl >/= 30 mL/min  )  Weekly,   R     Comments: while on enoxaparin therapy.    05/18/21 1523   05/19/21 0500  CBC  Daily,   R      05/18/21 1523   05/19/21 9604  Basic metabolic panel  Daily,   R      05/18/21 1523            Signed, Terrilee Croak, MD [facility] 05/19/2021

## 2021-05-19 NOTE — Progress Notes (Signed)
Subjective: 1 Day Post-Op Procedure(s) (LRB): INTRAMEDULLARY (IM) NAIL FEMORAL (Left)  Patient reports pain as mild.  Notes that she did not need anything for pain last night.  Denies fever, chills, N/V, CP, SOB.  Tolerating POs well.  Admits to flatus.  Objective:   VITALS:  Temp:  [97.7 F (36.5 C)-99.1 F (37.3 C)] 97.7 F (36.5 C) (09/21 0532) Pulse Rate:  [65-80] 68 (09/21 0532) Resp:  [12-19] 17 (09/21 0532) BP: (106-142)/(57-88) 135/63 (09/21 0532) SpO2:  [94 %-100 %] 100 % (09/21 0532)  General: WDWN patient in NAD. Psych:  Appropriate mood and affect. Neuro:  A&O x 3, Moving all extremities, sensation intact to light touch HEENT:  EOMs intact Chest:  Even non-labored respirations Skin:  Dressing C/D/I, no rashes or lesions Extremities: warm/dry, mild edema to L thigh, no erythema or echymosis.  No lymphadenopathy. Pulses: Popliteus 2+ MSK:  ROM: TKE, MMT: able to perform quad set, (-) Homan's    LABS Recent Labs    05/17/21 1030 05/18/21 1640 05/19/21 0307  HGB 11.2* 9.6* 9.0*  WBC 12.8* 11.9* 11.3*  PLT 279 226 193   Recent Labs    05/17/21 1030 05/18/21 1640 05/19/21 0307  NA 135  --  133*  K 4.4  --  4.9  CL 100  --  99  CO2 27  --  28  BUN 19  --  14  CREATININE 0.78 0.69 0.78  GLUCOSE 127*  --  151*   Recent Labs    05/17/21 1030  INR 1.0     Assessment/Plan: 1 Day Post-Op Procedure(s) (LRB): INTRAMEDULLARY (IM) NAIL FEMORAL (Left)  WBAT L LE with rolling walker. Up with therapy Disp: pending, likely SNF DVT ppx: Xarelto upon D/C After searching [place] PMP Aware the patient is provided a Rx for hydrocodone upon D/C D/C scripts on chart Plan for 2 week outpatient post-op visit.  Mechele Claude PA-C [facility] Office:  [phone]

## 2021-05-19 NOTE — TOC Transition Note (Signed)
Transition of Care [facility]) - CM/SW Discharge Note   Patient Details  [name] MRN: [id number] Date of Birth: 04-18-1936  Transition of [facility]) CM/SW Contact:  Lennart Pall, LCSW Phone Number: 05/19/2021, 12:43 PM   Clinical Narrative:    Met with pt and son today to review PT recommendations HHPT follow up.  Pt feels she did very well with PT eval and son confirms that he can stay to assist pt at home initially.  Agree with HHPT and requesting [place] - referral placed with [facility] and accepted.  Pt has all needed DME at home.  TOC will sign off.  Please reconsult if additional needs arise.   Final next level of care: [place] Barriers to Discharge: No Barriers Identified   Patient Goals and CMS Choice Patient states their goals for this hospitalization and ongoing recovery are:: return home      Discharge Placement                       Discharge Plan and Services                DME Arranged: N/A DME Agency: NA       HH Arranged: PT [place] Agency: [place] Date [place]: 05/19/21 Time [place]: [place] Representative spoke with at [place]: [place] ([place]) Interventions     Readmission Risk Interventions No flowsheet data found.

## 2021-05-19 NOTE — Evaluation (Signed)
Physical Therapy Evaluation Patient Details [name] MRN: [id number] DOB: 24-Apr-1936 Today's Date: 05/19/2021  History of Present Illness  85 y.o. female admitted after fall with resulting in left hip intertrochanteric fracture and s/p left hip IM nail on 05/18/21.  PMH significant for lichen planus on MTX, GERD, bil BKAs, back surgery  Clinical Impression  Patient is s/p above surgery resulting in functional limitations due to the deficits listed below (see PT Problem List). Patient will benefit from skilled PT to increase their independence and safety with mobility to allow discharge to the venue listed below.  Pt assisted with ambulating in hallway and remained in recliner end of session.  Son into room end of session and reports he can assist with staying with pt upon d/c.  Pt also states she has many friends and family that have offered to assist her at home.  Pt has 13 steps however these are to enter home.  Pt hopeful to d/c home so will practice steps next session and/or prior to d/c.       Recommendations for follow up therapy are one component of a multi-disciplinary discharge planning process, led by the attending physician.  Recommendations may be updated based on patient status, additional functional criteria and insurance authorization.  Follow Up Recommendations Home health PT;Supervision/Assistance - 24 hour    Equipment Recommendations  None recommended by PT    Recommendations for Other Services       Precautions / Restrictions Precautions Precautions: Fall Restrictions Weight Bearing Restrictions: No LLE Weight Bearing: Weight bearing as tolerated      Mobility  Bed Mobility Overal bed mobility: Needs Assistance Bed Mobility: Supine to Sit     Supine to sit: Min assist;HOB elevated     General bed mobility comments: verbal cues for technique, assist for Lt LE for pain control    Transfers Overall transfer level: Needs assistance Equipment  used: Rolling walker (2 wheeled) Transfers: Sit to/from Stand Sit to Stand: Min guard         General transfer comment: verbal cues for UE and LE positioning for pain control  Ambulation/Gait Ambulation/Gait assistance: Min guard Gait Distance (Feet): 160 Feet Assistive device: Rolling walker (2 wheeled) Gait Pattern/deviations: Step-to pattern;Decreased stance time - left;Antalgic     General Gait Details: verbal cues for sequence, RW positioning, step length, posture; pt denies any increased pain  Stairs            Wheelchair Mobility    Modified Rankin (Stroke Patients Only)       Balance                                             Pertinent Vitals/Pain Pain Assessment: 0-10 Pain Score: 3  Pain Location: left thigh Pain Descriptors / Indicators: Sore Pain Intervention(s): Repositioned;Monitored during session    Home Living Family/patient expects to be discharged to:: Private residence Living Arrangements: Alone Available Help at Discharge: Family;Friend(s);Available 24 hours/day Type of Home: Apartment Home Access: Stairs to enter Entrance Stairs-Rails: Right;Left;Can reach both Entrance Stairs-Number of Steps: 13 Home Layout: One level Home Equipment: Walker - 2 wheels;Bedside commode;Cane - single point      Prior Function Level of Independence: Independent         Comments: plans to have family/friends stay for 24/7 upon d/c     Hand Dominance  Extremity/Trunk Assessment        Lower Extremity Assessment Lower Extremity Assessment: LLE deficits/detail LLE Deficits / Details: anticipated post op hip weakness, able to perform ankle pumps       Communication   Communication: No difficulties  Cognition Arousal/Alertness: Awake/alert Behavior During Therapy: WFL for tasks assessed/performed Overall Cognitive Status: Within Functional Limits for tasks assessed                                         General Comments      Exercises Total Joint Exercises Ankle Circles/Pumps: AROM;Both;10 reps   Assessment/Plan    PT Assessment Patient needs continued PT services  PT Problem List Decreased mobility;Decreased activity tolerance;Decreased balance;Decreased knowledge of use of DME;Decreased strength;Pain       PT Treatment Interventions DME instruction;Gait training;Balance training;Therapeutic exercise;Functional mobility training;Patient/family education;Therapeutic activities;Stair training    PT Goals (Current goals can be found in the Care Plan section)  Acute Rehab PT Goals Patient Stated Goal: home if possible PT Goal Formulation: With patient Time For Goal Achievement: 05/26/21 Potential to Achieve Goals: Good    Frequency Min 5X/week   Barriers to discharge        Co-evaluation               AM-PAC PT "6 Clicks" Mobility  Outcome Measure Help needed turning from your back to your side while in a flat bed without using bedrails?: A Little Help needed moving from lying on your back to sitting on the side of a flat bed without using bedrails?: A Little Help needed moving to and from a bed to a chair (including a wheelchair)?: A Little Help needed standing up from a chair using your arms (e.g., wheelchair or bedside chair)?: A Little Help needed to walk in hospital room?: A Little Help needed climbing 3-5 steps with a railing? : A Lot 6 Click Score: 17    End of Session Equipment Utilized During Treatment: Gait belt Activity Tolerance: Patient tolerated treatment well Patient left: in chair;with call bell/phone within reach;with chair alarm set;with family/visitor present Nurse Communication: Mobility status PT Visit Diagnosis: Difficulty in walking, not elsewhere classified (R26.2)    Time: 7416-3845 PT Time Calculation (min) (ACUTE ONLY): 21 min   Charges:   PT Evaluation $PT Eval Low Complexity: 1 Low     Kati PT, DPT Acute  Rehabilitation Services Pager: [phone] Office: [phone]   Myrtis Hopping [place] 05/19/2021, 12:11 PM

## 2021-05-20 DIAGNOSIS — S72002A Fracture of unspecified part of neck of left femur, initial encounter for closed fracture: Secondary | ICD-10-CM | POA: Diagnosis not present

## 2021-05-20 LAB — CBC
HCT: 24.8 % — ABNORMAL LOW (ref 36.0–46.0)
Hemoglobin: 8.3 g/dL — ABNORMAL LOW (ref 12.0–15.0)
MCH: 30.9 pg (ref 26.0–34.0)
MCHC: 33.5 g/dL (ref 30.0–36.0)
MCV: 92.2 fL (ref 80.0–100.0)
Platelets: 214 10*3/uL (ref 150–400)
RBC: 2.69 MIL/uL — ABNORMAL LOW (ref 3.87–5.11)
RDW: 14.3 % (ref 11.5–15.5)
WBC: 9.2 10*3/uL (ref 4.0–10.5)
nRBC: 0 % (ref 0.0–0.2)

## 2021-05-20 LAB — BASIC METABOLIC PANEL
Anion gap: 4 — ABNORMAL LOW (ref 5–15)
BUN: 21 mg/dL (ref 8–23)
CO2: 28 mmol/L (ref 22–32)
Calcium: 8.6 mg/dL — ABNORMAL LOW (ref 8.9–10.3)
Chloride: 104 mmol/L (ref 98–111)
Creatinine, Ser: 0.84 mg/dL (ref 0.44–1.00)
GFR, Estimated: >60 mL/min (ref >60)
Glucose, Bld: 99 mg/dL (ref 70–99)
Potassium: 4.2 mmol/L (ref 3.5–5.1)
Sodium: 136 mmol/L (ref 135–145)

## 2021-05-20 MED ORDER — CALCIUM CARBONATE-VITAMIN D 500-200 MG-UNIT PO TABS: 1.0000 | ORAL_TABLET | Freq: Every day | ORAL | 2 refills | Status: AC

## 2021-05-20 NOTE — Progress Notes (Signed)
Physician Discharge Summary  MONAYE BLACKIE FUX:[id number] DOB: February 10, 1936 DOA: 05/17/2021  PCP: Christain Sacramento, MD  Admit date: 05/17/2021 Discharge date: 05/20/2021  Admitted From: Home Discharge disposition: Home with home health PT   Code Status: Partial Code   Discharge Diagnosis:   Active Problems:   Closed left hip fracture [facility])    Chief Complaint  Patient presents with   Hip Pain    fa   Fall    Brief narrative: MAKISHA MARRIN is a 85 y.o. female with PMH significant for lichen planus on MTX, GERD.  Patient presented to the ED on 9/19 after a fall and pain in left hip.  While vacuuming the floor at her [employer], she got entangled on its wire. In the ED, vital signs stable CT head was normal. X-ray of the left hip showed minimally displaced left intertrochanteric femur fracture. Orthopedics was consulted. Admitted to hospitalist service.  Subjective: Patient was seen and examined this morning.  Sitting up in chair.  Not in distress.  On symptoms.  Pain controlled.  Feels ready to go home today  Hospital course: Acute displaced left intertrochanteric femur fracture -Secondary to mechanical fall -Orthopedic consult appreciated.  Patient underwent intramedullary nailing on 9/20.  Uneventful postop status.  Pain controlled.  Weightbearing as tolerated with rolling walker.  Initially patient was thinking of going to rehab but feels comfortable going home now. -Noted that orthopedics have given her prescription for hydrocodone for pain control and Xarelto for DVT prophylaxis.  Lichen planus -Continue methotrexate weekly.    Vitamin D deficiency -Started on calcium and vitamin D supplement.  GERD -Continue PPI   Allergies as of 05/20/2021       Reactions   Codeine Nausea Only, Other (See Comments)   Makes pt crazy         Medication List     STOP taking these medications    meloxicam 15 MG tablet Commonly known as: MOBIC       TAKE these  medications    acetaminophen 500 MG tablet Commonly known as: TYLENOL Take 1,000 mg by mouth at bedtime.   Biotin 1000 MCG tablet Take 1,000 mcg by mouth daily.   calcium-vitamin D 500-200 MG-UNIT tablet Commonly known as: OSCAL WITH D Take 1 tablet by mouth daily with breakfast. Start taking on: May 21, 2021   docusate sodium 100 MG capsule Commonly known as: Colace Take 1 capsule (100 mg total) by mouth 2 (two) times daily. While taking narcotic pain medicine.   fexofenadine 180 MG tablet Commonly known as: ALLEGRA Take 180 mg by mouth daily.   HYDROcodone-acetaminophen 5-325 MG tablet Commonly known as: NORCO/VICODIN Take 1 tablet by mouth every 6 (six) hours as needed for up to 3 days for moderate pain or severe pain.   methotrexate 2.5 MG tablet Commonly known as: RHEUMATREX Take 5 mg by mouth once a week. Saturday   ondansetron 4 MG tablet Commonly known as: Zofran Take 1 tablet (4 mg total) by mouth daily as needed for nausea or vomiting.   pantoprazole 40 MG tablet Commonly known as: PROTONIX Take 40 mg by mouth daily before breakfast.   polyethylene glycol 17 g packet Commonly known as: MIRALAX / GLYCOLAX Take 17 g by mouth daily as needed for mild constipation.   rivaroxaban 10 MG Tabs tablet Commonly known as: Xarelto Take 1 tablet (10 mg total) by mouth daily.   senna 8.6 MG Tabs tablet Commonly known as: SENOKOT Take 2 tablets (  17.2 mg total) by mouth 2 (two) times daily.   SYSTANE ULTRA OP Place 1 drop into both eyes daily as needed (dry eyes).   valACYclovir 500 MG tablet Commonly known as: VALTREX Take 500 mg by mouth daily as needed (flare up).   vitamin B-12 1000 MCG tablet Commonly known as: CYANOCOBALAMIN Take 1,000 mcg by mouth daily.               Discharge Care Instructions  (From admission, onward)           Start     Ordered   05/20/21 0000  No dressing needed        05/20/21 1311            Discharge  Instructions:  Diet Recommendation:  Discharge Diet Orders (From admission, onward)     Start     Ordered   05/20/21 0000  Diet general        05/20/21 1311              Follow with Primary MD Christain Sacramento, MD in 7 days   Get CBC/BMP checked in next visit within 1 week by PCP or SNF MD ( we routinely change or add medications that can affect your baseline labs and fluid status, therefore we recommend that you get the mentioned basic workup next visit with your PCP, your PCP may decide not to get them or add new tests based on their clinical decision)  On your next visit with your PCP, please Get Medicines reviewed and adjusted.  Please request your PCP  to go over all Hospital Tests and Procedure/Radiological results at the follow up, please get all Hospital records sent to your Prim MD by signing hospital release before you go home.  Activity: As tolerated with Full fall precautions use walker/cane & assistance as needed  For Heart failure patients - Check your Weight same time everyday, if you gain over 2 pounds, or you develop in leg swelling, experience more shortness of breath or chest pain, call your Primary MD immediately. Follow Cardiac Low Salt Diet and 1.5 lit/day fluid restriction.  If you have smoked or chewed Tobacco in the last 2 yrs please stop smoking, stop any regular Alcohol  and or any Recreational drug use.  If you experience worsening of your admission symptoms, develop shortness of breath, life threatening emergency, suicidal or homicidal thoughts you must seek medical attention immediately by calling 911 or calling your MD immediately  if symptoms less severe.  You Must read complete instructions/literature along with all the possible adverse reactions/side effects for all the Medicines you take and that have been prescribed to you. Take any new Medicines after you have completely understood and accpet all the possible adverse reactions/side effects.   Do  not drive, operate heavy machinery, perform activities at heights, swimming or participation in water activities or provide baby sitting services if your were admitted for syncope or siezures until you have seen by Primary MD or a Neurologist and advised to do so again.  Do not drive when taking Pain medications.  Do not take more than prescribed Pain, Sleep and Anxiety Medications  Wear Seat belts while driving.   Please note You were cared for by a hospitalist during your hospital stay. If you have any questions about your discharge medications or the care you received while you were in the hospital after you are discharged, you can call the unit and asked to speak with the  hospitalist on call if the hospitalist that took care of you is not available. Once you are discharged, your primary care physician will handle any further medical issues. Please note that NO REFILLS for any discharge medications will be authorized once you are discharged, as it is imperative that you return to your primary care physician (or establish a relationship with a primary care physician if you do not have one) for your aftercare needs so that they can reassess your need for medications and monitor your lab values.    Follow ups:    Follow-up Information     Wylene Simmer, MD. Schedule an appointment as soon as possible for a visit in 2 week(s).   Specialty: Orthopedic Surgery Contact information: [address] [place] [address] [place] [address] [phone]         [facility] Follow up.   Why: to provide home health physical therapy        Christain Sacramento, MD Follow up.   Specialty: Family Medicine Contact information: [address] [place] [address] [phone]                 Wound care:   Incision (Closed) 05/18/21 Hip Left (Active)  Date First Assessed/Time First Assessed: 05/18/21 1341   Location: Hip  Location Orientation: Left    Assessments 05/18/2021   2:00 PM 05/19/2021  7:10 PM  Dressing Type Other (Comment) Silicone dressing  Dressing Clean;Intact;Dry Clean;Dry;Intact  Site / Wound Assessment Dressing in place / Unable to assess Dressing in place / Unable to assess  Drainage Amount None None  Treatment -- Ice applied     No Linked orders to display    Discharge Exam:   Vitals:   05/19/21 1452 05/19/21 1750 05/19/21 2059 05/20/21 0522  BP: (!) 106/59 (!) 116/53 (!) 101/55 (!) 110/59  Pulse: 76 96 85 76  Resp:  16 16 16   Temp:   98.2 F (36.8 C) 98.2 F (36.8 C)  TempSrc:      SpO2:  98% 97% 100%  Weight:      Height:        Body mass index is 22.14 kg/m.  General exam: Pleasant, elderly Caucasian female Skin: No rashes, lesions or ulcers. HEENT: Atraumatic, normocephalic, no obvious bleeding Lungs: Clear to auscultation bilaterally CVS: Regular rate and rhythm, no murmur GI/Abd soft, nontender, nondistended, bowel present CNS: Alert, awake, oriented x3 Psychiatry: Mood appropriate Extremities: No pedal edema, no calf tenderness  Time coordinating discharge: 35 minutes   The results of significant diagnostics from this hospitalization (including imaging, microbiology, ancillary and laboratory) are listed below for reference.    Procedures and Diagnostic Studies:   DG Chest 1 View  Result Date: 05/17/2021 CLINICAL DATA:  Fall, trauma EXAM: CHEST  1 VIEW COMPARISON:  None. FINDINGS: The heart size and mediastinal contours are within normal limits. Chronic interstitial changes. No pleural effusion. No acute osseous abnormality. Dextroscoliosis. IMPRESSION: No acute process in the chest. Electronically Signed   By: Macy Mis M.D.   On: 05/17/2021 11:04   CT HEAD WO CONTRAST (5MM)  Result Date: 05/17/2021 CLINICAL DATA:  Trauma EXAM: CT HEAD WITHOUT CONTRAST TECHNIQUE: Contiguous axial images were obtained from the base of the skull through the vertex without intravenous contrast. COMPARISON:  None. FINDINGS:  Brain: There is no acute intracranial hemorrhage, extra-axial fluid collection, or acute infarct. There is mild parenchymal volume loss and chronic white matter microangiopathy. The ventricles are not enlarged.  There is no mass lesion. There is no midline shift. Vascular: No hyperdense vessel or unexpected calcification. Skull: Normal. Negative for fracture or focal lesion. Sinuses/Orbits: The imaged paranasal sinuses are clear. Bilateral lens implants are in place. The globes and orbits are otherwise unremarkable. Other: There is degenerative change at the temporomandibular joints. IMPRESSION: No acute intracranial hemorrhage or calvarial fracture. Electronically Signed   By: Valetta Mole M.D.   On: 05/17/2021 10:53   DG C-Arm 1-60 Min-No Report  Result Date: 05/18/2021 Fluoroscopy was utilized by the requesting physician.  No radiographic interpretation.   DG Hip Unilat With Pelvis 2-3 Views Left  Result Date: 05/17/2021 CLINICAL DATA:  Hip pain EXAM: DG HIP (WITH OR WITHOUT PELVIS) 2-3V LEFT COMPARISON:  None. FINDINGS: There is a minimally displaced left intertrochanteric fracture. Femoroacetabular alignment is maintained. There is no fracture on the right. The SI joints and symphysis pubis are intact. IMPRESSION: Minimally displaced left intertrochanteric fracture. Electronically Signed   By: Valetta Mole M.D.   On: 05/17/2021 10:48   DG FEMUR MIN 2 VIEWS LEFT  Result Date: 05/18/2021 CLINICAL DATA:  Left proximal femoral fracture EXAM: LEFT FEMUR 2 VIEWS COMPARISON:  05/17/2021 FLUOROSCOPY TIME:  Radiation Exposure Index (as provided by the fluoroscopic device): 7.47 mGy If the device does not provide the exposure index: Fluoroscopy Time:  24 seconds Number of Acquired Images:  4 FINDINGS: Proximal medullary rod with compression screw extending across the femoral neck is seen. Fracture fragments are in near anatomic alignment. No soft tissue abnormality is seen. IMPRESSION: ORIF of proximal left  femoral fracture. Electronically Signed   By: Inez Catalina M.D.   On: 05/18/2021 15:50     Labs:   Basic Metabolic Panel: Recent Labs  Lab 05/17/21 1030 05/18/21 1640 05/19/21 0307 05/20/21 0315  NA 135  --  133* 136  K 4.4  --  4.9 4.2  CL 100  --  99 104  CO2 27  --  28 28  GLUCOSE 127*  --  151* 99  BUN 19  --  14 21  CREATININE 0.78 0.69 0.78 0.84  CALCIUM 9.0  --  8.6* 8.6*   GFR Estimated Creatinine Clearance: 42 mL/min (by C-G formula based on SCr of 0.84 mg/dL). Liver Function Tests: No results for input(s): AST, ALT, ALKPHOS, BILITOT, PROT, ALBUMIN in the last 168 hours. No results for input(s): LIPASE, AMYLASE in the last 168 hours. No results for input(s): AMMONIA in the last 168 hours. Coagulation profile Recent Labs  Lab 05/17/21 1030  INR 1.0    CBC: Recent Labs  Lab 05/17/21 1030 05/18/21 1640 05/19/21 0307 05/20/21 0315  WBC 12.8* 11.9* 11.3* 9.2  NEUTROABS 10.6*  --   --   --   HGB 11.2* 9.6* 9.0* 8.3*  HCT 34.0* 29.1* 27.3* 24.8*  MCV 94.2 92.4 91.9 92.2  PLT 279 226 193 214   Cardiac Enzymes: No results for input(s): CKTOTAL, CKMB, CKMBINDEX, TROPONINI in the last 168 hours. BNP: Invalid input(s): POCBNP CBG: No results for input(s): GLUCAP in the last 168 hours. D-Dimer No results for input(s): DDIMER in the last 72 hours. Hgb A1c No results for input(s): HGBA1C in the last 72 hours. Lipid Profile No results for input(s): CHOL, HDL, LDLCALC, TRIG, CHOLHDL, LDLDIRECT in the last 72 hours. Thyroid function studies No results for input(s): TSH, T4TOTAL, T3FREE, THYROIDAB in the last 72 hours.  Invalid input(s): FREET3 Anemia work up No results for input(s): VITAMINB12, FOLATE, FERRITIN, TIBC, IRON,  RETICCTPCT in the last 72 hours. Microbiology Recent Results (from the past 240 hour(s))  Resp Panel by RT-PCR (Flu A&B, Covid) Nasopharyngeal Swab     Status: None   Collection Time: 05/17/21 10:30 AM   Specimen: Nasopharyngeal Swab;  Nasopharyngeal(NP) swabs in vial transport medium  Result Value Ref Range Status   SARS Coronavirus 2 by RT PCR NEGATIVE NEGATIVE Final    Comment: (NOTE) SARS-CoV-2 target nucleic acids are NOT DETECTED.  The SARS-CoV-2 RNA is generally detectable in upper respiratory specimens during the acute phase of infection. The lowest concentration of SARS-CoV-2 viral copies this assay can detect is 138 copies/mL. A negative result does not preclude SARS-Cov-2 infection and should not be used as the sole basis for treatment or other patient management decisions. A negative result may occur with  improper specimen collection/handling, submission of specimen other than nasopharyngeal swab, presence of viral mutation(s) within the areas targeted by this assay, and inadequate number of viral copies(<138 copies/mL). A negative result must be combined with clinical observations, patient history, and epidemiological information. The expected result is Negative.  Fact Sheet for Patients:  EntrepreneurPulse.com.au  Fact Sheet for Healthcare Providers:  IncredibleEmployment.be  This test is no t yet approved or cleared by the [place] FDA and  has been authorized for detection and/or diagnosis of SARS-CoV-2 by FDA under an Emergency Use Authorization (EUA). This EUA will remain  in effect (meaning this test can be used) for the duration of the COVID-19 declaration under Section 564(b)(1) of the Act, 21 U.S.C.section 360bbb-3(b)(1), unless the authorization is terminated  or revoked sooner.       Influenza A by PCR NEGATIVE NEGATIVE Final   Influenza B by PCR NEGATIVE NEGATIVE Final    Comment: (NOTE) The Xpert Xpress SARS-CoV-2/FLU/RSV plus assay is intended as an aid in the diagnosis of influenza from Nasopharyngeal swab specimens and should not be used as a sole basis for treatment. Nasal washings and aspirates are unacceptable for Xpert Xpress  SARS-CoV-2/FLU/RSV testing.  Fact Sheet for Patients: EntrepreneurPulse.com.au  Fact Sheet for Healthcare Providers: IncredibleEmployment.be  This test is not yet approved or cleared by the [place] FDA and has been authorized for detection and/or diagnosis of SARS-CoV-2 by FDA under an Emergency Use Authorization (EUA). This EUA will remain in effect (meaning this test can be used) for the duration of the COVID-19 declaration under Section 564(b)(1) of the Act, 21 U.S.C. section 360bbb-3(b)(1), unless the authorization is terminated or revoked.  Performed at [facility], [place] [address], [place], [place] [address]   Surgical pcr screen     Status: None   Collection Time: 05/17/21  6:52 PM   Specimen: Nasal Mucosa; Nasal Swab  Result Value Ref Range Status   MRSA, PCR NEGATIVE NEGATIVE Final   Staphylococcus aureus NEGATIVE NEGATIVE Final    Comment: (NOTE) The Xpert SA Assay (FDA approved for NASAL specimens in patients 68 years of age and older), is one component of a comprehensive surveillance program. It is not intended to diagnose infection nor to guide or monitor treatment. Performed at [facility], [place] [address], [place], [place] [address]      Signed: Marlowe Aschoff Rayvon Brandvold  [facility] 05/20/2021, 1:12 PM

## 2021-05-20 NOTE — Progress Notes (Signed)
Physical Therapy Treatment Patient Details [name] MRN: [id number] DOB: 01-Sep-1935 Today's Date: 05/20/2021   History of Present Illness 85 y.o. female admitted after fall with resulting in left hip intertrochanteric fracture and s/p left hip IM nail on 05/18/21.  PMH significant for lichen planus on MTX, GERD, bil BKAs, back surgery    PT Comments    Pt ambulated in hallway and practiced safe stair technique.  Pt mobilizing well and feels ready for d/c home.  Pt has gait belt for family to assist upon d/c.    Recommendations for follow up therapy are one component of a multi-disciplinary discharge planning process, led by the attending physician.  Recommendations may be updated based on patient status, additional functional criteria and insurance authorization.  Follow Up Recommendations  Home health PT;Supervision/Assistance - 24 hour     Equipment Recommendations  None recommended by PT    Recommendations for Other Services       Precautions / Restrictions Precautions Precautions: Fall Restrictions LLE Weight Bearing: Weight bearing as tolerated     Mobility  Bed Mobility               General bed mobility comments: pt in recliner on arrival    Transfers Overall transfer level: Needs assistance Equipment used: Rolling walker (2 wheeled) Transfers: Sit to/from Stand Sit to Stand: Min guard;Supervision         General transfer comment: verbal cues for UE and LE positioning for pain control  Ambulation/Gait Ambulation/Gait assistance: Min guard;Supervision Gait Distance (Feet): 280 Feet Assistive device: Rolling walker (2 wheeled) Gait Pattern/deviations: Step-to pattern;Decreased stance time - left;Antalgic     General Gait Details: verbal cues for sequence, RW positioning, step length, posture   Stairs Stairs: Yes Stairs assistance: Min guard Stair Management: Step to pattern;Forwards;Two rails Number of Stairs: 4 General stair  comments: verbal cues for sequence and safety; pt performed twice and reports understanding   Wheelchair Mobility    Modified Rankin (Stroke Patients Only)       Balance                                            Cognition Arousal/Alertness: Awake/alert Behavior During Therapy: WFL for tasks assessed/performed Overall Cognitive Status: Within Functional Limits for tasks assessed                                        Exercises      General Comments        Pertinent Vitals/Pain Pain Assessment: 0-10 Pain Score: 2  Pain Location: left thigh Pain Descriptors / Indicators: Sore Pain Intervention(s): Repositioned;Monitored during session    Home Living                      Prior Function            PT Goals (current goals can now be found in the care plan section) Progress towards PT goals: Progressing toward goals    Frequency    Min 5X/week      PT Plan Current plan remains appropriate    Co-evaluation              AM-PAC PT "6 Clicks" Mobility   Outcome Measure  Help needed turning from  your back to your side while in a flat bed without using bedrails?: A Little Help needed moving from lying on your back to sitting on the side of a flat bed without using bedrails?: A Little Help needed moving to and from a bed to a chair (including a wheelchair)?: A Little Help needed standing up from a chair using your arms (e.g., wheelchair or bedside chair)?: A Little Help needed to walk in hospital room?: A Little Help needed climbing 3-5 steps with a railing? : A Little 6 Click Score: 18    End of Session Equipment Utilized During Treatment: Gait belt Activity Tolerance: Patient tolerated treatment well Patient left: with call bell/phone within reach;with chair alarm set;in chair Nurse Communication: Mobility status PT Visit Diagnosis: Difficulty in walking, not elsewhere classified (R26.2)     Time:  7011-0034 PT Time Calculation (min) (ACUTE ONLY): 10 min  Charges:  $Gait Training: 8-22 mins                    Arlyce Dice, DPT Acute Rehabilitation Services Pager: [phone] Office: [place] 05/20/2021, 1:23 PM

## 2021-05-20 NOTE — Care Management Important Message (Signed)
Important Message  Patient Details IM Letter given to the Patient. [name] MRN: [id number] Date of Birth: 1936/08/29   Medicare Important Message Given:  Yes     Kerin Salen 05/20/2021, 3:42 PM

## 2021-05-21 NOTE — Discharge Summary (Addendum)
Physician Discharge Summary  Linda Watson:[id number] DOB: 1936-04-19 DOA: 05/17/2021  PCP: Christain Sacramento, MD  Admit date: 05/17/2021 Discharge date: 05/20/2021  Admitted From: Home Discharge disposition: Home with home health PT   Code Status: Prior   Discharge Diagnosis:   Active Problems:   Closed left hip fracture [facility])    Chief Complaint  Patient presents with   Hip Pain    fa   Fall    Brief narrative: Linda Watson is a 85 y.o. female with PMH significant for lichen planus on MTX, GERD.  Patient presented to the ED on 9/19 after a fall and pain in left hip.  While vacuuming the floor at her church, she got entangled on its wire. In the ED, vital signs stable CT head was normal. X-ray of the left hip showed minimally displaced left intertrochanteric femur fracture. Orthopedics was consulted. Admitted to hospitalist service.  Subjective: Patient was seen and examined this morning.  Sitting up in chair.  Not in distress.  On symptoms.  Pain controlled.  Feels ready to go home today  Hospital course: Acute displaced left intertrochanteric femur fracture -Secondary to mechanical fall -Orthopedic consult appreciated.  Patient underwent intramedullary nailing on 9/20.  Uneventful postop status.  Pain controlled.  Weightbearing as tolerated with rolling walker.  Initially patient was thinking of going to rehab but feels comfortable going home now. -Noted that orthopedics have given her prescription for hydrocodone for pain control and Xarelto for DVT prophylaxis.  Lichen planus -Continue methotrexate weekly.    Vitamin D deficiency -Started on calcium and vitamin D supplement.  GERD -Continue PPI   Allergies as of 05/20/2021       Reactions   Codeine Nausea Only, Other (See Comments)   Makes pt crazy         Medication List     STOP taking these medications    meloxicam 15 MG tablet Commonly known as: MOBIC       TAKE these medications     acetaminophen 500 MG tablet Commonly known as: TYLENOL Take 1,000 mg by mouth at bedtime.   Biotin 1000 MCG tablet Take 1,000 mcg by mouth daily.   calcium-vitamin D 500-200 MG-UNIT tablet Commonly known as: OSCAL WITH D Take 1 tablet by mouth daily with breakfast.   docusate sodium 100 MG capsule Commonly known as: Colace Take 1 capsule (100 mg total) by mouth 2 (two) times daily. While taking narcotic pain medicine.   fexofenadine 180 MG tablet Commonly known as: ALLEGRA Take 180 mg by mouth daily.   HYDROcodone-acetaminophen 5-325 MG tablet Commonly known as: NORCO/VICODIN Take 1 tablet by mouth every 6 (six) hours as needed for up to 3 days for moderate pain or severe pain.   methotrexate 2.5 MG tablet Commonly known as: RHEUMATREX Take 5 mg by mouth once a week. Saturday   ondansetron 4 MG tablet Commonly known as: Zofran Take 1 tablet (4 mg total) by mouth daily as needed for nausea or vomiting.   pantoprazole 40 MG tablet Commonly known as: PROTONIX Take 40 mg by mouth daily before breakfast.   polyethylene glycol 17 g packet Commonly known as: MIRALAX / GLYCOLAX Take 17 g by mouth daily as needed for mild constipation.   rivaroxaban 10 MG Tabs tablet Commonly known as: Xarelto Take 1 tablet (10 mg total) by mouth daily.   senna 8.6 MG Tabs tablet Commonly known as: SENOKOT Take 2 tablets (17.2 mg total) by mouth 2 (two)  times daily.   SYSTANE ULTRA OP Place 1 drop into both eyes daily as needed (dry eyes).   valACYclovir 500 MG tablet Commonly known as: VALTREX Take 500 mg by mouth daily as needed (flare up).   vitamin B-12 1000 MCG tablet Commonly known as: CYANOCOBALAMIN Take 1,000 mcg by mouth daily.               Discharge Care Instructions  (From admission, onward)           Start     Ordered   05/20/21 0000  No dressing needed        05/20/21 1311            Discharge Instructions:  Diet Recommendation:  Discharge  Diet Orders (From admission, onward)     Start     Ordered   05/20/21 0000  Diet general        05/20/21 1311              Follow with Primary MD Christain Sacramento, MD in 7 days   Get CBC/BMP checked in next visit within 1 week by PCP or SNF MD ( we routinely change or add medications that can affect your baseline labs and fluid status, therefore we recommend that you get the mentioned basic workup next visit with your PCP, your PCP may decide not to get them or add new tests based on their clinical decision)  On your next visit with your PCP, please Get Medicines reviewed and adjusted.  Please request your PCP  to go over all Hospital Tests and Procedure/Radiological results at the follow up, please get all Hospital records sent to your Prim MD by signing hospital release before you go home.  Activity: As tolerated with Full fall precautions use walker/cane & assistance as needed  For Heart failure patients - Check your Weight same time everyday, if you gain over 2 pounds, or you develop in leg swelling, experience more shortness of breath or chest pain, call your Primary MD immediately. Follow Cardiac Low Salt Diet and 1.5 lit/day fluid restriction.  If you have smoked or chewed Tobacco in the last 2 yrs please stop smoking, stop any regular Alcohol  and or any Recreational drug use.  If you experience worsening of your admission symptoms, develop shortness of breath, life threatening emergency, suicidal or homicidal thoughts you must seek medical attention immediately by calling 911 or calling your MD immediately  if symptoms less severe.  You Must read complete instructions/literature along with all the possible adverse reactions/side effects for all the Medicines you take and that have been prescribed to you. Take any new Medicines after you have completely understood and accpet all the possible adverse reactions/side effects.   Do not drive, operate heavy machinery, perform  activities at heights, swimming or participation in water activities or provide baby sitting services if your were admitted for syncope or siezures until you have seen by Primary MD or a Neurologist and advised to do so again.  Do not drive when taking Pain medications.  Do not take more than prescribed Pain, Sleep and Anxiety Medications  Wear Seat belts while driving.   Please note You were cared for by a hospitalist during your hospital stay. If you have any questions about your discharge medications or the care you received while you were in the hospital after you are discharged, you can call the unit and asked to speak with the hospitalist on call if the hospitalist that  took care of you is not available. Once you are discharged, your primary care physician will handle any further medical issues. Please note that NO REFILLS for any discharge medications will be authorized once you are discharged, as it is imperative that you return to your primary care physician (or establish a relationship with a primary care physician if you do not have one) for your aftercare needs so that they can reassess your need for medications and monitor your lab values.    Follow ups:    Follow-up Information     Wylene Simmer, MD. Schedule an appointment as soon as possible for a visit in 2 week(s).   Specialty: Orthopedic Surgery Contact information: [address] [place] [address] [place] [address] [phone]         [facility] Follow up.   Why: to provide home health physical therapy        Christain Sacramento, MD Follow up.   Specialty: Family Medicine Contact information: [address] [place] [address] [phone]                 Wound care:   Incision (Closed) 05/18/21 Hip Left (Active)  Date First Assessed/Time First Assessed: 05/18/21 1341   Location: Hip  Location Orientation: Left    Assessments 05/18/2021  2:00 PM 05/19/2021  7:10 PM  Dressing Type  Other (Comment) Silicone dressing  Dressing Clean;Intact;Dry Clean;Dry;Intact  Site / Wound Assessment Dressing in place / Unable to assess Dressing in place / Unable to assess  Drainage Amount None None  Treatment -- Ice applied     No Linked orders to display    Discharge Exam:   Vitals:   05/19/21 1750 05/19/21 2059 05/20/21 0522 05/20/21 1300  BP: (!) 116/53 (!) 101/55 (!) 110/59 (!) 103/50  Pulse: 96 85 76 80  Resp: 16 16 16 17   Temp:  98.2 F (36.8 C) 98.2 F (36.8 C) 98.4 F (36.9 C)  TempSrc:    Oral  SpO2: 98% 97% 100% 99%  Weight:      Height:        Body mass index is 22.14 kg/m.  General exam: Pleasant, elderly Caucasian female Skin: No rashes, lesions or ulcers. HEENT: Atraumatic, normocephalic, no obvious bleeding Lungs: Clear to auscultation bilaterally CVS: Regular rate and rhythm, no murmur GI/Abd soft, nontender, nondistended, bowel present CNS: Alert, awake, oriented x3 Psychiatry: Mood appropriate Extremities: No pedal edema, no calf tenderness  Time coordinating discharge: 35 minutes   The results of significant diagnostics from this hospitalization (including imaging, microbiology, ancillary and laboratory) are listed below for reference.    Procedures and Diagnostic Studies:   DG Chest 1 View  Result Date: 05/17/2021 CLINICAL DATA:  Fall, trauma EXAM: CHEST  1 VIEW COMPARISON:  None. FINDINGS: The heart size and mediastinal contours are within normal limits. Chronic interstitial changes. No pleural effusion. No acute osseous abnormality. Dextroscoliosis. IMPRESSION: No acute process in the chest. Electronically Signed   By: Macy Mis M.D.   On: 05/17/2021 11:04   CT HEAD WO CONTRAST (5MM)  Result Date: 05/17/2021 CLINICAL DATA:  Trauma EXAM: CT HEAD WITHOUT CONTRAST TECHNIQUE: Contiguous axial images were obtained from the base of the skull through the vertex without intravenous contrast. COMPARISON:  None. FINDINGS: Brain: There is no  acute intracranial hemorrhage, extra-axial fluid collection, or acute infarct. There is mild parenchymal volume loss and chronic white matter microangiopathy. The ventricles are not enlarged. There is no mass  lesion. There is no midline shift. Vascular: No hyperdense vessel or unexpected calcification. Skull: Normal. Negative for fracture or focal lesion. Sinuses/Orbits: The imaged paranasal sinuses are clear. Bilateral lens implants are in place. The globes and orbits are otherwise unremarkable. Other: There is degenerative change at the temporomandibular joints. IMPRESSION: No acute intracranial hemorrhage or calvarial fracture. Electronically Signed   By: Valetta Mole M.D.   On: 05/17/2021 10:53   DG C-Arm 1-60 Min-No Report  Result Date: 05/18/2021 Fluoroscopy was utilized by the requesting physician.  No radiographic interpretation.   DG Hip Unilat With Pelvis 2-3 Views Left  Result Date: 05/17/2021 CLINICAL DATA:  Hip pain EXAM: DG HIP (WITH OR WITHOUT PELVIS) 2-3V LEFT COMPARISON:  None. FINDINGS: There is a minimally displaced left intertrochanteric fracture. Femoroacetabular alignment is maintained. There is no fracture on the right. The SI joints and symphysis pubis are intact. IMPRESSION: Minimally displaced left intertrochanteric fracture. Electronically Signed   By: Valetta Mole M.D.   On: 05/17/2021 10:48   DG FEMUR MIN 2 VIEWS LEFT  Result Date: 05/18/2021 CLINICAL DATA:  Left proximal femoral fracture EXAM: LEFT FEMUR 2 VIEWS COMPARISON:  05/17/2021 FLUOROSCOPY TIME:  Radiation Exposure Index (as provided by the fluoroscopic device): 7.47 mGy If the device does not provide the exposure index: Fluoroscopy Time:  24 seconds Number of Acquired Images:  4 FINDINGS: Proximal medullary rod with compression screw extending across the femoral neck is seen. Fracture fragments are in near anatomic alignment. No soft tissue abnormality is seen. IMPRESSION: ORIF of proximal left femoral fracture.  Electronically Signed   By: Inez Catalina M.D.   On: 05/18/2021 15:50     Labs:   Basic Metabolic Panel: Recent Labs  Lab 05/17/21 1030 05/18/21 1640 05/19/21 0307 05/20/21 0315  NA 135  --  133* 136  K 4.4  --  4.9 4.2  CL 100  --  99 104  CO2 27  --  28 28  GLUCOSE 127*  --  151* 99  BUN 19  --  14 21  CREATININE 0.78 0.69 0.78 0.84  CALCIUM 9.0  --  8.6* 8.6*   GFR Estimated Creatinine Clearance: 42 mL/min (by C-G formula based on SCr of 0.84 mg/dL). Liver Function Tests: No results for input(s): AST, ALT, ALKPHOS, BILITOT, PROT, ALBUMIN in the last 168 hours. No results for input(s): LIPASE, AMYLASE in the last 168 hours. No results for input(s): AMMONIA in the last 168 hours. Coagulation profile Recent Labs  Lab 05/17/21 1030  INR 1.0    CBC: Recent Labs  Lab 05/17/21 1030 05/18/21 1640 05/19/21 0307 05/20/21 0315  WBC 12.8* 11.9* 11.3* 9.2  NEUTROABS 10.6*  --   --   --   HGB 11.2* 9.6* 9.0* 8.3*  HCT 34.0* 29.1* 27.3* 24.8*  MCV 94.2 92.4 91.9 92.2  PLT 279 226 193 214   Cardiac Enzymes: No results for input(s): CKTOTAL, CKMB, CKMBINDEX, TROPONINI in the last 168 hours. BNP: Invalid input(s): POCBNP CBG: No results for input(s): GLUCAP in the last 168 hours. D-Dimer No results for input(s): DDIMER in the last 72 hours. Hgb A1c No results for input(s): HGBA1C in the last 72 hours. Lipid Profile No results for input(s): CHOL, HDL, LDLCALC, TRIG, CHOLHDL, LDLDIRECT in the last 72 hours. Thyroid function studies No results for input(s): TSH, T4TOTAL, T3FREE, THYROIDAB in the last 72 hours.  Invalid input(s): FREET3 Anemia work up No results for input(s): VITAMINB12, FOLATE, FERRITIN, TIBC, IRON, RETICCTPCT in the last  72 hours. Microbiology Recent Results (from the past 240 hour(s))  Resp Panel by RT-PCR (Flu A&B, Covid) Nasopharyngeal Swab     Status: None   Collection Time: 05/17/21 10:30 AM   Specimen: Nasopharyngeal Swab; Nasopharyngeal(NP)  swabs in vial transport medium  Result Value Ref Range Status   SARS Coronavirus 2 by RT PCR NEGATIVE NEGATIVE Final    Comment: (NOTE) SARS-CoV-2 target nucleic acids are NOT DETECTED.  The SARS-CoV-2 RNA is generally detectable in upper respiratory specimens during the acute phase of infection. The lowest concentration of SARS-CoV-2 viral copies this assay can detect is 138 copies/mL. A negative result does not preclude SARS-Cov-2 infection and should not be used as the sole basis for treatment or other patient management decisions. A negative result may occur with  improper specimen collection/handling, submission of specimen other than nasopharyngeal swab, presence of viral mutation(s) within the areas targeted by this assay, and inadequate number of viral copies(<138 copies/mL). A negative result must be combined with clinical observations, patient history, and epidemiological information. The expected result is Negative.  Fact Sheet for Patients:  EntrepreneurPulse.com.au  Fact Sheet for Healthcare Providers:  IncredibleEmployment.be  This test is no t yet approved or cleared by the [place] FDA and  has been authorized for detection and/or diagnosis of SARS-CoV-2 by FDA under an Emergency Use Authorization (EUA). This EUA will remain  in effect (meaning this test can be used) for the duration of the COVID-19 declaration under Section 564(b)(1) of the Act, 21 U.S.C.section 360bbb-3(b)(1), unless the authorization is terminated  or revoked sooner.       Influenza A by PCR NEGATIVE NEGATIVE Final   Influenza B by PCR NEGATIVE NEGATIVE Final    Comment: (NOTE) The Xpert Xpress SARS-CoV-2/FLU/RSV plus assay is intended as an aid in the diagnosis of influenza from Nasopharyngeal swab specimens and should not be used as a sole basis for treatment. Nasal washings and aspirates are unacceptable for Xpert Xpress  SARS-CoV-2/FLU/RSV testing.  Fact Sheet for Patients: EntrepreneurPulse.com.au  Fact Sheet for Healthcare Providers: IncredibleEmployment.be  This test is not yet approved or cleared by the [place] FDA and has been authorized for detection and/or diagnosis of SARS-CoV-2 by FDA under an Emergency Use Authorization (EUA). This EUA will remain in effect (meaning this test can be used) for the duration of the COVID-19 declaration under Section 564(b)(1) of the Act, 21 U.S.C. section 360bbb-3(b)(1), unless the authorization is terminated or revoked.  Performed at [facility], [place] [address] [place], [place] [address]   Surgical pcr screen     Status: None   Collection Time: 05/17/21  6:52 PM   Specimen: Nasal Mucosa; Nasal Swab  Result Value Ref Range Status   MRSA, PCR NEGATIVE NEGATIVE Final   Staphylococcus aureus NEGATIVE NEGATIVE Final    Comment: (NOTE) The Xpert SA Assay (FDA approved for NASAL specimens in patients 52 years of age and older), is one component of a comprehensive surveillance program. It is not intended to diagnose infection nor to guide or monitor treatment. Performed at [facility], [place] [address], [place], [place] [address]      Signed: Marlowe Aschoff Ona Rathert  [facility] 05/21/2021, 9:35 AM

## 2021-05-23 ENCOUNTER — Encounter (HOSPITAL_COMMUNITY): Payer: Self-pay | Admitting: Orthopedic Surgery

## 2021-07-02 DIAGNOSIS — M25552 Pain in left hip: Secondary | ICD-10-CM | POA: Diagnosis not present

## 2021-07-02 DIAGNOSIS — S72142D Displaced intertrochanteric fracture of left femur, subsequent encounter for closed fracture with routine healing: Secondary | ICD-10-CM | POA: Diagnosis not present

## 2021-07-12 DIAGNOSIS — H16223 Keratoconjunctivitis sicca, not specified as Sjogren's, bilateral: Secondary | ICD-10-CM | POA: Diagnosis not present

## 2022-05-11 ENCOUNTER — Other Ambulatory Visit: Payer: Self-pay | Admitting: Oral Surgery

## 2023-02-18 ENCOUNTER — Emergency Department (HOSPITAL_BASED_OUTPATIENT_CLINIC_OR_DEPARTMENT_OTHER): Payer: HMO

## 2023-02-18 ENCOUNTER — Encounter (HOSPITAL_BASED_OUTPATIENT_CLINIC_OR_DEPARTMENT_OTHER): Payer: Self-pay

## 2023-02-18 ENCOUNTER — Emergency Department (HOSPITAL_BASED_OUTPATIENT_CLINIC_OR_DEPARTMENT_OTHER): Payer: HMO | Admitting: Radiology

## 2023-02-18 ENCOUNTER — Emergency Department (HOSPITAL_BASED_OUTPATIENT_CLINIC_OR_DEPARTMENT_OTHER)
Admission: EM | Admit: 2023-02-18 | Discharge: 2023-02-18 | Disposition: A | Discharge status: Home / Self Care | Payer: HMO | Attending: Emergency Medicine | Admitting: Emergency Medicine

## 2023-02-18 DIAGNOSIS — W1839XA Other fall on same level, initial encounter: Secondary | ICD-10-CM | POA: Insufficient documentation

## 2023-02-18 DIAGNOSIS — S42415A Nondisplaced simple supracondylar fracture without intercondylar fracture of left humerus, initial encounter for closed fracture: Secondary | ICD-10-CM | POA: Diagnosis not present

## 2023-02-18 DIAGNOSIS — S59902A Unspecified injury of left elbow, initial encounter: Secondary | ICD-10-CM | POA: Diagnosis present

## 2023-02-18 DIAGNOSIS — S41111A Laceration without foreign body of right upper arm, initial encounter: Secondary | ICD-10-CM | POA: Diagnosis not present

## 2023-02-18 DIAGNOSIS — S41112A Laceration without foreign body of left upper arm, initial encounter: Secondary | ICD-10-CM | POA: Diagnosis not present

## 2023-02-18 DIAGNOSIS — Z7901 Long term (current) use of anticoagulants: Secondary | ICD-10-CM | POA: Insufficient documentation

## 2023-02-18 MED ORDER — IBUPROFEN 600 MG PO TABS: 600.0000 mg | ORAL_TABLET | Freq: Four times a day (QID) | ORAL | 0 refills | Status: AC | PRN

## 2023-02-18 MED ORDER — ACETAMINOPHEN 325 MG PO TABS: 650.0000 mg | ORAL_TABLET | Freq: Four times a day (QID) | ORAL | 0 refills | Status: AC | PRN

## 2023-02-18 MED ORDER — FENTANYL CITRATE PF 50 MCG/ML IJ SOSY: 25.0000 ug | PREFILLED_SYRINGE | Freq: Once | INTRAMUSCULAR | Status: DC

## 2023-02-18 MED ORDER — FENTANYL CITRATE PF 50 MCG/ML IJ SOSY
50.0000 ug | PREFILLED_SYRINGE | Freq: Once | INTRAMUSCULAR | Status: AC
  Administered 2023-02-18: 50 ug via INTRAMUSCULAR
  Filled 2023-02-18: qty 1

## 2023-02-18 NOTE — ED Provider Notes (Signed)
[place] EMERGENCY DEPARTMENT AT [facility] Provider Note   CSN: [id number] Arrival date & time: 02/18/23  1558     History {Add pertinent medical, surgical, social history, OB history to HPI:1} Chief Complaint  Patient presents with   Linda Watson is a 87 y.o. female.  Pt is a 87 yo female presenting for left elbow pain after fall today. Pt states she was walking up her outdoor wooden steps when she fell forward. Skin tears to bilateral upper extremities. Left elbow pain. Denies any other bony pain. Denies head trauma or LoC. Denies blood thinner use.   The history is provided by the patient. No language interpreter was used.  Fall       Home Medications Prior to Admission medications   Medication Sig Start Date End Date Taking? Authorizing Provider  acetaminophen (TYLENOL) 500 MG tablet Take 1,000 mg by mouth at bedtime.    [provider]  Biotin 1000 MCG tablet Take 1,000 mcg by mouth daily.    [provider]  calcium-vitamin D (OSCAL WITH D) 500-200 MG-UNIT tablet Take 1 tablet by mouth daily with breakfast. 05/21/21 08/19/21  Dahal, Melina Schools, MD  docusate sodium (COLACE) 100 MG capsule Take 1 capsule (100 mg total) by mouth 2 (two) times daily. While taking narcotic pain medicine. 05/19/21   Jacinta Shoe, PA-C  fexofenadine (ALLEGRA) 180 MG tablet Take 180 mg by mouth daily.    [provider]  methotrexate (RHEUMATREX) 2.5 MG tablet Take 5 mg by mouth once a week. Saturday 04/23/21   [provider]  ondansetron (ZOFRAN) 4 MG tablet Take 1 tablet (4 mg total) by mouth daily as needed for nausea or vomiting. 05/19/21   Jacinta Shoe, PA-C  pantoprazole (PROTONIX) 40 MG tablet Take 40 mg by mouth daily before breakfast.     [provider]  Polyethyl Glycol-Propyl Glycol (SYSTANE ULTRA OP) Place 1 drop into both eyes daily as needed (dry eyes).    [provider]  polyethylene glycol (MIRALAX  / GLYCOLAX) packet Take 17 g by mouth daily as needed for mild constipation. 05/02/18   Dorothy Spark, PA-C  rivaroxaban (XARELTO) 10 MG TABS tablet Take 1 tablet (10 mg total) by mouth daily. 05/19/21   Jacinta Shoe, PA-C  senna (SENOKOT) 8.6 MG TABS tablet Take 2 tablets (17.2 mg total) by mouth 2 (two) times daily. 05/19/21   Jacinta Shoe, PA-C  valACYclovir (VALTREX) 500 MG tablet Take 500 mg by mouth daily as needed (flare up).    [provider]  vitamin B-12 (CYANOCOBALAMIN) 1000 MCG tablet Take 1,000 mcg by mouth daily.    [provider]      Allergies    Codeine    Review of Systems   Review of Systems  Physical Exam Updated Vital Signs BP 139/78 (BP Location: Right Arm)   Pulse 85   Temp 98 F (36.7 C)   Resp 20   Ht 5\' 3"  (1.6 m)   Wt 56.7 kg   SpO2 96%   BMI 22.14 kg/m  Physical Exam  ED Results / Procedures / Treatments   Labs (all labs ordered are listed, but only abnormal results are displayed) Labs Reviewed - No data to display  EKG None  Radiology DG Elbow Complete Left  Result Date: 02/18/2023 CLINICAL DATA:  Fall downstairs with trauma to the left elbow EXAM: LEFT ELBOW - COMPLETE 4 VIEW COMPARISON:  None Available. FINDINGS:  Fracture: Transverse supracondylar distal humerus fracture. Minimal anterior displacement. Healing: None. Soft tissue: Large joint effusion. IMPRESSION: Minimally displaced supracondylar distal humerus fracture. Electronically Signed   By: Agustin Cree M.D.   On: 02/18/2023 19:50    Procedures Procedures  {Document cardiac monitor, telemetry assessment procedure when appropriate:1}  Medications Ordered in ED Medications  fentaNYL (SUBLIMAZE) injection 50 mcg (50 mcg Intramuscular Given 02/18/23 1927)    ED Course/ Medical Decision Making/ A&P   {   Click here for ABCD2, HEART and other calculatorsREFRESH Note before signing :1}                          Medical Decision Making Amount and/or  Complexity of Data Reviewed Radiology: ordered.  Risk Prescription drug management.   87 yo female presenting for left elbow pain after fall today.  Supracondlyer fracture left humerous. Has followed with [facility] othropedics Dr. Jillyn Hidden for bilateral knees. Will f/u there first thing Monday. Pain control achieved. Pain meds sent ot pharmancy. Slip placed.   Patient in no distress and overall condition improved here in the ED. Detailed discussions were had with the patient regarding current findings, and need for close f/u with PCP or on call doctor. The patient has been instructed to return immediately if the symptoms worsen in any way for re-evaluation. Patient verbalized understanding and is in agreement with current care plan. All questions answered prior to discharge.   {Document critical care time when appropriate:1} {Document review of labs and clinical decision tools ie heart score, Chads2Vasc2 etc:1}  {Document your independent review of radiology images, and any outside records:1} {Document your discussion with family members, caretakers, and with consultants:1} {Document social determinants of health affecting pt's care:1} {Document your decision making why or why not admission, treatments were needed:1} Final Clinical Impression(s) / ED Diagnoses Final diagnoses:  None    Rx / DC Orders ED Discharge Orders     None

## 2023-02-18 NOTE — ED Triage Notes (Signed)
Pt states she was going upstairs and did not pick up feet enough. Pt fell forward and caught herself with her left elbow, and right forearm on rail.  Pt not on blood thinners, did not hit head.

## 2023-02-18 NOTE — ED Notes (Signed)
Bilateral arm skin tears cleaned with sterile water, bacitracin, non adhesive dressing and gauze wrap

## 2023-02-22 ENCOUNTER — Other Ambulatory Visit: Payer: Self-pay | Admitting: Orthopedic Surgery

## 2023-02-22 DIAGNOSIS — S42402A Unspecified fracture of lower end of left humerus, initial encounter for closed fracture: Secondary | ICD-10-CM

## 2023-02-23 ENCOUNTER — Ambulatory Visit
Admission: RE | Admit: 2023-02-23 | Discharge: 2023-02-23 | Disposition: A | Discharge status: Home / Self Care | Payer: HMO | Source: Ambulatory Visit | Attending: Orthopedic Surgery | Admitting: Orthopedic Surgery

## 2023-02-23 DIAGNOSIS — S42402A Unspecified fracture of lower end of left humerus, initial encounter for closed fracture: Secondary | ICD-10-CM

## 2024-04-07 ENCOUNTER — Encounter (HOSPITAL_BASED_OUTPATIENT_CLINIC_OR_DEPARTMENT_OTHER): Payer: Self-pay

## 2024-04-07 ENCOUNTER — Emergency Department (HOSPITAL_BASED_OUTPATIENT_CLINIC_OR_DEPARTMENT_OTHER)

## 2024-04-07 ENCOUNTER — Other Ambulatory Visit: Payer: Self-pay

## 2024-04-07 ENCOUNTER — Emergency Department (HOSPITAL_BASED_OUTPATIENT_CLINIC_OR_DEPARTMENT_OTHER): Admission: EM | Admit: 2024-04-07 | Discharge: 2024-04-07 | Disposition: A | Discharge status: Home / Self Care

## 2024-04-07 DIAGNOSIS — R42 Dizziness and giddiness: Secondary | ICD-10-CM | POA: Diagnosis present

## 2024-04-07 LAB — COMPREHENSIVE METABOLIC PANEL WITH GFR
ALT: 15 U/L (ref 0–44)
AST: 22 U/L (ref 15–41)
Albumin: 4.6 g/dL (ref 3.5–5.0)
Alkaline Phosphatase: 89 U/L (ref 38–126)
Anion gap: 11 (ref 5–15)
BUN: 18 mg/dL (ref 8–23)
CO2: 26 mmol/L (ref 22–32)
Calcium: 10.3 mg/dL (ref 8.9–10.3)
Chloride: 97 mmol/L — ABNORMAL LOW (ref 98–111)
Creatinine, Ser: 1.09 mg/dL — ABNORMAL HIGH (ref 0.44–1.00)
GFR, Estimated: 49 mL/min — ABNORMAL LOW (ref >60)
Glucose, Bld: 95 mg/dL (ref 70–99)
Potassium: 4.8 mmol/L (ref 3.5–5.1)
Sodium: 134 mmol/L — ABNORMAL LOW (ref 135–145)
Total Bilirubin: 1.1 mg/dL (ref 0.0–1.2)
Total Protein: 7 g/dL (ref 6.5–8.1)

## 2024-04-07 LAB — URINALYSIS, ROUTINE W REFLEX MICROSCOPIC
Bilirubin Urine: NEGATIVE
Glucose, UA: NEGATIVE mg/dL
Hgb urine dipstick: NEGATIVE
Ketones, ur: NEGATIVE mg/dL
Leukocytes,Ua: NEGATIVE
Nitrite: NEGATIVE
Protein, ur: NEGATIVE mg/dL
Specific Gravity, Urine: 1.005 (ref 1.005–1.030)
pH: 7 (ref 5.0–8.0)

## 2024-04-07 LAB — CBC
HCT: 37.3 % (ref 36.0–46.0)
Hemoglobin: 12.5 g/dL (ref 12.0–15.0)
MCH: 31.7 pg (ref 26.0–34.0)
MCHC: 33.5 g/dL (ref 30.0–36.0)
MCV: 94.7 fL (ref 80.0–100.0)
Platelets: 248 K/uL (ref 150–400)
RBC: 3.94 MIL/uL (ref 3.87–5.11)
RDW: 15.1 % (ref 11.5–15.5)
WBC: 5.7 K/uL (ref 4.0–10.5)
nRBC: 0 % (ref 0.0–0.2)

## 2024-04-07 LAB — CBG MONITORING, ED: Glucose-Capillary: 89 mg/dL (ref 70–99)

## 2024-04-07 LAB — TROPONIN T, HIGH SENSITIVITY: Troponin T High Sensitivity: <15 ng/L (ref <19)

## 2024-04-07 MED ORDER — SODIUM CHLORIDE 0.9 % IV BOLUS
500.0000 mL | Freq: Once | INTRAVENOUS | Status: AC
  Administered 2024-04-07: 500 mL via INTRAVENOUS

## 2024-04-07 NOTE — Discharge Instructions (Addendum)
 Follow-up with your primary care doctor at your scheduled appointment.  You can continue your meclizine as needed.  Take your other medications as prescribed and keep an eye on your symptoms.  Discussed them with your primary care doctor.  Return to the ER for any new or worsening symptoms.

## 2024-04-07 NOTE — ED Triage Notes (Signed)
 Patient reports dizziness since Tuesday. She states she feels like she is spinning and that her vision has been unable to focus. She saw here PCP on Thursday and they told her she may have a UTI, ear infection. She was given meclizine and keflex but reports it doesn't fully help.

## 2024-04-07 NOTE — ED Provider Notes (Addendum)
 [facility] AT [facility] Provider Note   CSN: [id number] Arrival date & time: 04/07/24  9057     Patient presents with: Dizziness and Hypertension   Linda Watson is a 88 y.o. female.   88 year old female presents for evaluation of dizziness.  She states she has been dizzy for couple days but is Been getting worse.  She is a difficulty with balance.  Does have a history of vertigo but describes this as being more lightheaded rather than the room spinning.  States it gets worse when she changes positions.  Denies any other symptoms or concerns at this time.   Dizziness Associated symptoms: no chest pain, no palpitations, no shortness of breath and no vomiting   Hypertension Pertinent negatives include no chest pain, no abdominal pain and no shortness of breath.       Prior to Admission medications   Medication Sig Start Date End Date Taking? Authorizing Provider  Biotin  1000 MCG tablet Take 1,000 mcg by mouth daily.    [provider]  calcium -vitamin D  (OSCAL WITH D) 500-200 MG-UNIT tablet Take 1 tablet by mouth daily with breakfast. 05/21/21 08/19/21  Dahal, Chapman, MD  docusate sodium  (COLACE) 100 MG capsule Take 1 capsule (100 mg total) by mouth 2 (two) times daily. While taking narcotic pain medicine. 05/19/21   Aniceto Eva Grebe, PA-C  fexofenadine (ALLEGRA) 180 MG tablet Take 180 mg by mouth daily.    [provider]  ibuprofen  (ADVIL ) 600 MG tablet Take 1 tablet (600 mg total) by mouth every 6 (six) hours as needed for mild pain. 02/18/23   Elnor Hila P, DO  methotrexate (RHEUMATREX) 2.5 MG tablet Take 5 mg by mouth once a week. Saturday 04/23/21   [provider]  ondansetron  (ZOFRAN ) 4 MG tablet Take 1 tablet (4 mg total) by mouth daily as needed for nausea or vomiting. 05/19/21   Aniceto Eva Grebe, PA-C  pantoprazole  (PROTONIX ) 40 MG tablet Take 40 mg by mouth daily before breakfast.     [provider]   Polyethyl Glycol-Propyl Glycol (SYSTANE ULTRA OP) Place 1 drop into both eyes daily as needed (dry eyes).    [provider]  polyethylene glycol (MIRALAX  / GLYCOLAX ) packet Take 17 g by mouth daily as needed for mild constipation. 05/02/18   Bissell, Jaclyn M, PA-C  rivaroxaban  (XARELTO ) 10 MG TABS tablet Take 1 tablet (10 mg total) by mouth daily. 05/19/21   Aniceto Eva Grebe, PA-C  senna (SENOKOT) 8.6 MG TABS tablet Take 2 tablets (17.2 mg total) by mouth 2 (two) times daily. 05/19/21   Aniceto Eva Grebe, PA-C  valACYclovir  (VALTREX ) 500 MG tablet Take 500 mg by mouth daily as needed (flare up).    [provider]  vitamin B-12 (CYANOCOBALAMIN ) 1000 MCG tablet Take 1,000 mcg by mouth daily.    [provider]    Allergies: Codeine    Review of Systems  Constitutional:  Positive for fatigue. Negative for chills and fever.  HENT:  Negative for ear pain and sore throat.   Eyes:  Negative for pain and visual disturbance.  Respiratory:  Negative for cough and shortness of breath.   Cardiovascular:  Negative for chest pain and palpitations.  Gastrointestinal:  Negative for abdominal pain and vomiting.  Genitourinary:  Negative for dysuria and hematuria.  Musculoskeletal:  Negative for arthralgias and back pain.  Skin:  Negative for color change and rash.  Neurological:  Positive for dizziness. Negative for seizures and  syncope.  All other systems reviewed and are negative.   Updated Vital Signs BP 126/66   Pulse 62   Temp 98 F (36.7 C) (Oral)   Resp 16   SpO2 97%   Physical Exam Vitals and nursing note reviewed.  Constitutional:      General: She is not in acute distress.    Appearance: Normal appearance. She is well-developed. She is not ill-appearing.  HENT:     Head: Normocephalic and atraumatic.  Eyes:     Conjunctiva/sclera: Conjunctivae normal.  Cardiovascular:     Rate and Rhythm: Normal rate and regular rhythm.     Pulses: Normal pulses.      Heart sounds: Normal heart sounds. No murmur heard. Pulmonary:     Effort: Pulmonary effort is normal. No respiratory distress.     Breath sounds: Normal breath sounds. No stridor. No wheezing or rhonchi.  Abdominal:     General: There is no distension.     Palpations: Abdomen is soft. There is no mass.     Tenderness: There is no abdominal tenderness.     Hernia: No hernia is present.  Musculoskeletal:        General: No swelling.     Cervical back: Neck supple.  Skin:    General: Skin is warm and dry.     Capillary Refill: Capillary refill takes less than 2 seconds.  Neurological:     Mental Status: She is alert.  Psychiatric:        Mood and Affect: Mood normal.     (all labs ordered are listed, but only abnormal results are displayed) Labs Reviewed  COMPREHENSIVE METABOLIC PANEL WITH GFR - Abnormal; Notable for the following components:      Result Value   Sodium 134 (*)    Chloride 97 (*)    Creatinine, Ser 1.09 (*)    GFR, Estimated 49 (*)    All other components within normal limits  URINALYSIS, ROUTINE W REFLEX MICROSCOPIC - Abnormal; Notable for the following components:   Color, Urine COLORLESS (*)    All other components within normal limits  CBC  CBG MONITORING, ED  TROPONIN T, HIGH SENSITIVITY  TROPONIN T, HIGH SENSITIVITY    EKG: EKG Interpretation Date/Time:  Sunday April 07 2024 10:03:16 EDT Ventricular Rate:  67 PR Interval:  136 QRS Duration:  107 QT Interval:  411 QTC Calculation: 434 R Axis:   47  Text Interpretation: Sinus rhythm Consider left atrial enlargement Anterior infarct, old Nonspecific T abnormalities, lateral leads Compared with prior EKG from 05/17/2021 Confirmed by Gennaro Bouchard ([id number]) on 04/07/2024 10:14:14 AM  Radiology: CT Head Wo Contrast Result Date: 04/07/2024 CLINICAL DATA:  Dizziness.  Ataxia. EXAM: CT HEAD WITHOUT CONTRAST TECHNIQUE: Contiguous axial images were obtained from the base of the skull through the vertex  without intravenous contrast. RADIATION DOSE REDUCTION: This exam was performed according to the departmental dose-optimization program which includes automated exposure control, adjustment of the mA and/or kV according to patient size and/or use of iterative reconstruction technique. COMPARISON:  05/17/2021 FINDINGS: Brain: No evidence of intracranial hemorrhage, acute infarction, hydrocephalus, extra-axial collection, or mass lesion/mass effect. Moderate chronic small vessel disease is again noted. Vascular:  No hyperdense vessel or other acute findings. Skull: No evidence of fracture or other significant bone abnormality. Sinuses/Orbits:  No acute findings. Other: None. IMPRESSION: No acute intracranial abnormality. Moderate chronic small vessel disease. Electronically Signed   By: Norleen DELENA Kil M.D.   On: 04/07/2024 10:43  Procedures   Medications Ordered in the ED  sodium chloride  0.9 % bolus 500 mL (500 mLs Intravenous New Bag/Given 04/07/24 1051)                                    Medical Decision Making Cardiac monitor interpretation: Sinus rhythm, no ectopy  Patient's laboratory reviewed by me and unremarkable.  She is not orthostatic.  Given meclizine and a bolus of IV fluids here and feeling much better.  Vitals have remained stable throughout her stay in the emergency department.  Advised to continue her meds as prescribed and obtain close follow-up with primary care and otherwise return to the ER for new or worsening symptoms.  Patient feels comfortable to plan to be discharged home.  Problems Addressed: Dizziness: acute illness or injury  Amount and/or Complexity of Data Reviewed External Data Reviewed: notes.    Details: Outpatient records reviewed patient last seen in family medicine office on 8 - 7 - 25 for vertigo Labs: ordered. Decision-making details documented in ED Course.    Details: Lab workup reviewed by me and unremarkable, CBC within normal limits, no UTI,  negative troponin Radiology: ordered and independent interpretation performed. Decision-making details documented in ED Course.    Details: Ordered and interpreted by me independently of radiology CT head: Shows no acute abnormality ECG/medicine tests: ordered and independent interpretation performed. Decision-making details documented in ED Course.    Details: EKG ordered and interpreted by me in the absence of cardiology and shows sinus rhythm, no STEMI no acute abnormality when compared to prior  Risk OTC drugs. Prescription drug management. Drug therapy requiring intensive monitoring for toxicity.    Final diagnoses:  Dizziness    ED Discharge Orders     None          Gennaro Duwaine CROME, DO 04/07/24 1510    Elizabeht Suto L, DO 04/07/24 1512

## 2024-04-11 ENCOUNTER — Ambulatory Visit (INDEPENDENT_AMBULATORY_CARE_PROVIDER_SITE_OTHER): Admitting: Otolaryngology

## 2024-04-11 ENCOUNTER — Encounter (INDEPENDENT_AMBULATORY_CARE_PROVIDER_SITE_OTHER): Payer: Self-pay | Admitting: Otolaryngology

## 2024-04-11 ENCOUNTER — Other Ambulatory Visit (HOSPITAL_COMMUNITY)
Admission: RE | Admit: 2024-04-11 | Discharge: 2024-04-11 | Disposition: A | Discharge status: Home / Self Care | Source: Ambulatory Visit | Attending: Otolaryngology | Admitting: Otolaryngology

## 2024-04-11 VITALS — BP 158/84 | HR 76 | Ht 63.0 in | Wt 125.0 lb

## 2024-04-11 DIAGNOSIS — K1379 Other lesions of oral mucosa: Secondary | ICD-10-CM

## 2024-04-11 DIAGNOSIS — K068 Other specified disorders of gingiva and edentulous alveolar ridge: Secondary | ICD-10-CM | POA: Diagnosis present

## 2024-04-11 MED ORDER — TRAMADOL HCL 50 MG PO TABS: 50.0000 mg | ORAL_TABLET | Freq: Three times a day (TID) | ORAL | 0 refills | Status: AC | PRN

## 2024-04-11 NOTE — Progress Notes (Signed)
 Dear Dr. Joanette, Here is my assessment for our mutual patient, Linda Watson. Thank you for allowing me the opportunity to care for your patient. Please do not hesitate to contact me should you have any other questions. Sincerely, Dr. Eldora Blanch  Otolaryngology Clinic Note Referring provider: Dr. Joanette HPI:  Linda Watson is a 88 y.o. female kindly referred by Dr. Joanette for evaluation of oral cavity lesion  Initial visit (03/2024):  She reports that she has had a mandibular gingival lesion for last several months. Has not healed. No prior biopsy. Has enlarged somewhat. It is painful, has not tried anything for it except warm salt water  and peridex . Some sensitivity right mandible as well. Does wear a partial on mandible, and it irritates her but not wearing currenlty due to discomfort. She has had a prior left maxillary gingival caricnoma, which was biopsied/removed in 2022. No prior tobacco use.  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; Dx: leukoplakia, oral lesion; Rx: ref to ENT CBC and CMP 04/07/2024: WBC 5.7, BUN/C 18/1.09 05/11/2022 Biopsy:  Path 11/18/2020:   PMH/Meds/All/SocHx/FamHx/ROS:   Past Medical History:  Diagnosis Date   Arthritis    RIGHT KNEE PAIN AND ARTHRITIS   Bee sting    left anterior hand, states she was  stung yesterday 03-16-17, only c/o mild pain, apparent redness and edema observed; denies any severe allergy to been venom (ie no tongue swelling or throat closing); RN encouraged pt to see her primary to r/o worsening or s/s of infection ; patient agreeable to this    Fracture, patella    01/21/12 - immobilizer x one month    GERD (gastroesophageal reflux disease)    Hemorrhoids    History of hiatal hernia    History of shingles SEVERAL YRS AGO   1ST TIME LEFT SIDE AND BACK, 2ND TIME REAR END AREA   HNP (herniated nucleus pulposus), lumbar    Lichen planus    LESIONS  IN MOUTH AND OCCASIONALLY GENITAL AREA   Lumbar stenosis    L5-S1   PONV (postoperative nausea and vomiting)    SEVERE PONV WITH KNEE REPLACMENT 2018   Seasonal allergies    Spinal stenosis    PAIN IN LOWER BACK AND DOWN LEFT LEG -ALSO HAS NUMBNESS AND TINGLING IN LEFT LEG TO FOOT     Past Surgical History:  Procedure Laterality Date   ABDOMINAL HYSTERECTOMY  YRS AGO   COMPLETE    BACK SURGERY  PT NOT SURE WHEN   ONE TIME PREVIOUS BACK SURGERY ALSO    BILATERAL KNEE ARTHROSCOPIES     COLONSCOPY  YRS AGO   FEMUR IM NAIL Left 05/18/2021   Procedure: INTRAMEDULLARY (IM) NAIL FEMORAL;  Surgeon: Kit Rush, MD;  Location: [facility];  Service: Orthopedics;  Laterality: Left;   HERNIA REPAIR  YRS AGO   UMBILICAL HERNIA   HIATAL HERNIA REPAIR  LEFT SHOULDER SURGERY  YRS AGO   ARTHROSCOPY   LUMBAR LAMINECTOMY/DECOMPRESSION MICRODISCECTOMY Left 11/23/2017   Procedure: Microlumbar decompression Lumbar Five-Sacral One left, Hemilaminectomy Lumbar five.;  Surgeon: Duwayne Purchase, MD;  Location: [facility];  Service: Orthopedics;  Laterality: Left;   TOTAL KNEE ARTHROPLASTY Right 04/13/2017   Procedure: RIGHT TOTAL KNEE ARTHROPLASTY;  Surgeon: Duwayne Purchase, MD;  Location: [facility];  Service: Orthopedics;  Laterality: Right;  120 mins   TOTAL KNEE ARTHROPLASTY Left 05/02/2018   Procedure: LEFT TOTAL KNEE ARTHROPLASTY;  Surgeon: Duwayne Purchase, MD;  Location: [facility];  Service: Orthopedics;  Laterality: Left;  120 mins    History reviewed. No pertinent family history.   Social Connections: Not on file      Current Outpatient Medications:    Biotin  1000 MCG tablet, Take 1,000 mcg by mouth daily., Disp: , Rfl:    calcium -vitamin D  (OSCAL WITH D) 500-200 MG-UNIT tablet, Take 1 tablet by mouth daily with breakfast., Disp: 30 tablet, Rfl: 2   fluticasone (FLONASE) 50 MCG/ACT nasal spray, Place 2 sprays into the nose., Disp: , Rfl:    ibuprofen  (ADVIL ) 600 MG tablet, Take 1 tablet (600 mg total) by mouth every 6 (six) hours as needed for mild pain., Disp: 30 tablet, Rfl: 0   meclizine (ANTIVERT) 25 MG tablet, Take 25 mg by mouth., Disp: , Rfl:    meloxicam  (MOBIC ) 15 MG tablet, Take 15 mg by mouth daily., Disp: , Rfl:    methotrexate (RHEUMATREX) 2.5 MG tablet, Take 5 mg by mouth once a week. Saturday, Disp: , Rfl:    nystatin (MYCOSTATIN) 100000 UNIT/ML suspension, Take 500,000 Units by mouth., Disp: , Rfl:    ondansetron  (ZOFRAN ) 4 MG tablet, Take 1 tablet (4 mg total) by mouth daily as needed for nausea or vomiting., Disp: 30 tablet, Rfl: 0   pantoprazole  (PROTONIX ) 40 MG tablet, Take 40 mg by mouth daily before breakfast. , Disp: , Rfl:    Polyethyl Glycol-Propyl Glycol (SYSTANE ULTRA OP), Place 1 drop into both eyes daily as needed (dry eyes)., Disp: , Rfl:    polyethylene glycol (MIRALAX  / GLYCOLAX ) packet, Take 17 g by mouth daily as needed for mild constipation., Disp: 14 each, Rfl: 0   rivaroxaban  (XARELTO ) 10 MG TABS tablet, Take 1 tablet (10 mg total) by mouth daily., Disp: 14 tablet, Rfl: 0   senna (SENOKOT) 8.6 MG TABS tablet, Take 2 tablets (17.2 mg total) by mouth 2 (two) times daily., Disp: 30 tablet, Rfl: 0   traMADol  (ULTRAM ) 50 MG tablet, Take 1 tablet (50 mg total) by mouth every 8 (eight) hours as needed for up to 5 days., Disp: 10 tablet, Rfl: 0   valACYclovir  (VALTREX ) 500 MG tablet, Take 500 mg by mouth  daily as needed (flare up)., Disp: , Rfl:    vitamin B-12 (CYANOCOBALAMIN ) 1000 MCG tablet, Take 1,000 mcg by mouth daily., Disp: , Rfl:    docusate sodium  (COLACE) 100 MG capsule, Take 1 capsule (100 mg total) by mouth 2 (two) times daily. While taking narcotic pain medicine. (Patient not taking: Reported on 04/11/2024), Disp: 30 capsule, Rfl: 0   fexofenadine (ALLEGRA) 180 MG tablet, Take 180 mg by mouth daily. (Patient not taking: Reported on 04/11/2024), Disp: , Rfl:    Physical Exam:   BP (!) 158/84 (BP Location: Left Arm, Patient Position: Sitting, Cuff Size: Normal)   Pulse 76   Ht 5' 3 (1.6 m)   Wt 125 lb (56.7 kg)   SpO2 96%  BMI 22.14 kg/m   Salient findings:  CN II-XII intact Bilateral EAC clear and TM intact with well pneumatized middle ear spaces Anterior rhinoscopy: Septum intact; bilateral inferior turbinates without significant hypertrophy No lesions of oral cavity/oropharynx; edentulous maxilla, no worrisome lesions noted, midline mandible on the buccal surface there is clear leukoplakic change. On lingual surface close to midline, there is a tender and somewhat endophytic lesion with some mucosal change also surrounding the right mandibular incisor as well (overall area about 2 x 1.5 cm)- lingual surface lesion does not move well/independently compared to periosteum. After R/B/A discussion, this was biopsied.  No obviously palpable neck masses/lymphadenopathy/thyromegaly No respiratory distress or stridor  Seprately Identifiable Procedures:  Prior to initiating any procedures, risks/benefits/alternatives were explained to the patient and consent obtained. Procedure Note - Punch Biopsy  [name] MRN: [id number] Date: 04/11/24   Pre-procedure diagnosis: Oral cavity Lesion, concern for malignancy Post-procedure diagnosis: Same Procedure: Punch biopsy of floor of oral lesion (CPT [phone]) Complications: None apparent EBL: <5 mL  Indication: Oral cavity  gingival Lesion, concern for malignancy  Risks and benefits of biopsy were explained to the patient, written consent was obtained.   The patient was identified as the correct patient.  The area surrounding the lesion was injected 1% Lidocaine  with 1:100,000 epinephrine  and allowed to work.. A 4 mm punch biopsy was used to make a circular incision in the periphery of the lesion (leukoplakic and the one posterior). The tissue was carefully picked up with a forcep and cut free with sharp scissors. It was placed in formalin and passed off the field. There was minimal bleeding that fully resolved prior to the end of the procedure with the following interventions: silver nitrate cautery. The patient tolerated the procedure well.    Electronically signed by: Eldora KATHEE Blanch, MD 04/11/2024 12:12 PM    Impression & Plans:  Linda Watson is a 88 y.o. female with:  1. Lesion of gingiva   2. Mass of oral cavity    Persistent mass, growing and tender. We discussed need for biopsy - concern for malignancy. Done today. Recommend soft diet x5 days, not wear lower partial, good oral care with salt water  gargles and rinses. Tylenol  and ibuprofen  for pain, tramadol  50mg  q6h PRN for breakthrough pain F/u 1 week  See below regarding exact medications prescribed this encounter including dosages and route: Meds ordered this encounter  Medications   traMADol  (ULTRAM ) 50 MG tablet    Sig: Take 1 tablet (50 mg total) by mouth every 8 (eight) hours as needed for up to 5 days.    Dispense:  10 tablet    Refill:  0      Thank you for allowing me the opportunity to care for your patient. Please do not hesitate to contact me should you have any other questions.  Sincerely, Eldora Blanch, MD Otolaryngologist (ENT), [facility] Phone: [phone] Fax: [phone]  04/11/2024, 12:12 PM   MDM:  Level 4 - 99204 Complexity/Problems addressed: mod - chronic problem now, worsening Data complexity:  mod - independent review of notes, labs, tests - Morbidity: mod  - Prescription Drug prescribed or managed: y

## 2024-04-12 LAB — SURGICAL PATHOLOGY

## 2024-04-18 ENCOUNTER — Ambulatory Visit (INDEPENDENT_AMBULATORY_CARE_PROVIDER_SITE_OTHER): Admitting: Otolaryngology

## 2024-04-18 ENCOUNTER — Encounter (INDEPENDENT_AMBULATORY_CARE_PROVIDER_SITE_OTHER): Payer: Self-pay | Admitting: Otolaryngology

## 2024-04-18 ENCOUNTER — Ambulatory Visit (HOSPITAL_BASED_OUTPATIENT_CLINIC_OR_DEPARTMENT_OTHER)
Admission: RE | Admit: 2024-04-18 | Discharge: 2024-04-18 | Disposition: A | Discharge status: Home / Self Care | Source: Ambulatory Visit | Attending: Otolaryngology | Admitting: Otolaryngology

## 2024-04-18 VITALS — BP 154/80 | HR 71

## 2024-04-18 DIAGNOSIS — C069 Malignant neoplasm of mouth, unspecified: Secondary | ICD-10-CM | POA: Insufficient documentation

## 2024-04-18 DIAGNOSIS — Z9889 Other specified postprocedural states: Secondary | ICD-10-CM

## 2024-04-18 MED ORDER — IOHEXOL 300 MG/ML  SOLN
100.0000 mL | Freq: Once | INTRAMUSCULAR | Status: AC | PRN
  Administered 2024-04-18: 75 mL via INTRAVENOUS

## 2024-04-18 NOTE — Progress Notes (Signed)
 Dear Dr. Tanda, Here is my assessment for our mutual patient, Linda Watson. Thank you for allowing me the opportunity to care for your patient. Please do not hesitate to contact me should you have any other questions. Sincerely, Dr. Eldora Blanch  Otolaryngology Clinic Note Referring provider: Dr. Tanda HPI:  Linda Watson is a 88 y.o. female kindly referred by Dr. Tanda for evaluation of oral cavity lesion  Initial visit (03/2024):  She reports that she has had a mandibular gingival lesion for last several months. Has not healed. No prior biopsy. Has enlarged somewhat. It is painful, has not tried anything for it except warm salt water  and peridex . Some sensitivity right mandible as well. Does wear a partial on mandible, and it irritates her but not wearing currenlty due to discomfort. She has had a prior left maxillary gingival caricnoma, which was biopsied/removed in 2022. No prior tobacco use.  Patient otherwise denies: - dysphagia, odynophagia, unintentional weight loss - changes in voice, shortness of breath - ear pain, neck masses  --------------------------------------------------------- 04/18/2024 Returns after biopsy. No significant pain afterwards, otherwise denies dysphagia, odynophagia, weight loss, ear pain. Eating soft foods.  Personal or FHx of bleeding dz or anesthesia difficulty: no  AP/AC: no  Tobacco: no  PMHx: GERD, Osteoporosis  Independent Review of Additional Tests or Records:  Eva Hutching, DMD Referral notes reviewed and uploaded or available in chart in media tab (04/04/2024): noted 88 yo F referred for evaluation of dentition; noted soreness around tooth #27, 24, 25 for past few months; h/o left maxillary squamous cell carcinoma saw by Dr. Karis. 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; Dx: leukoplakia, oral lesion; Rx: ref to ENT CBC and CMP 04/07/2024: WBC 5.7, BUN/C  18/1.09 05/11/2022 Biopsy:  Path 11/18/2020:   Path 04/11/2024:  PMH/Meds/All/SocHx/FamHx/ROS:   Past Medical History:  Diagnosis Date   Arthritis    RIGHT KNEE PAIN AND ARTHRITIS   Bee sting    left anterior hand, states she was stung yesterday 03-16-17, only c/o mild pain, apparent redness and edema observed; denies any severe allergy to been venom (ie no tongue swelling or throat closing); RN encouraged pt to see her primary to r/o worsening or s/s of infection ; patient agreeable to this    Fracture, patella    01/21/12 - immobilizer x one month    GERD (gastroesophageal reflux disease)    Hemorrhoids    History of hiatal hernia    History of shingles SEVERAL YRS AGO   1ST TIME LEFT SIDE AND BACK, 2ND TIME REAR END AREA   HNP (herniated nucleus pulposus), lumbar    Lichen planus    LESIONS  IN MOUTH AND OCCASIONALLY GENITAL AREA   Lumbar stenosis    L5-S1   PONV (postoperative nausea and vomiting)    SEVERE PONV WITH KNEE REPLACMENT 2018   Seasonal allergies    Spinal stenosis    PAIN IN LOWER BACK AND DOWN LEFT LEG -ALSO HAS NUMBNESS AND TINGLING IN LEFT LEG TO FOOT     Past Surgical History:  Procedure Laterality Date   ABDOMINAL HYSTERECTOMY  YRS AGO   COMPLETE    BACK SURGERY  PT NOT SURE WHEN   ONE TIME PREVIOUS BACK SURGERY ALSO    BILATERAL KNEE ARTHROSCOPIES     COLONSCOPY  YRS AGO   FEMUR IM NAIL Left 05/18/2021   Procedure: INTRAMEDULLARY (IM) NAIL FEMORAL;  Surgeon: Kit Rush, MD;  Location:  [facility];  Service: Orthopedics;  Laterality: Left;   HERNIA REPAIR  YRS AGO   UMBILICAL HERNIA   HIATAL HERNIA REPAIR     LEFT SHOULDER SURGERY  YRS AGO   ARTHROSCOPY   LUMBAR LAMINECTOMY/DECOMPRESSION MICRODISCECTOMY Left 11/23/2017   Procedure: Microlumbar decompression Lumbar Five-Sacral One left, Hemilaminectomy Lumbar five.;  Surgeon: Duwayne Purchase, MD;  Location: [facility];  Service: Orthopedics;  Laterality: Left;   TOTAL KNEE ARTHROPLASTY Right 04/13/2017    Procedure: RIGHT TOTAL KNEE ARTHROPLASTY;  Surgeon: Duwayne Purchase, MD;  Location: [facility];  Service: Orthopedics;  Laterality: Right;  120 mins   TOTAL KNEE ARTHROPLASTY Left 05/02/2018   Procedure: LEFT TOTAL KNEE ARTHROPLASTY;  Surgeon: Duwayne Purchase, MD;  Location: [facility];  Service: Orthopedics;  Laterality: Left;  120 mins    No family history on file.   Social Connections: Not on file      Current Outpatient Medications:    Biotin  1000 MCG tablet, Take 1,000 mcg by mouth daily., Disp: , Rfl:    fluticasone (FLONASE) 50 MCG/ACT nasal spray, Place 2 sprays into the nose., Disp: , Rfl:    ibuprofen  (ADVIL ) 600 MG tablet, Take 1 tablet (600 mg total) by mouth every 6 (six) hours as needed for mild pain., Disp: 30 tablet, Rfl: 0   meclizine (ANTIVERT) 25 MG tablet, Take 25 mg by mouth., Disp: , Rfl:    meloxicam  (MOBIC ) 15 MG tablet, Take 15 mg by mouth daily., Disp: , Rfl:    methotrexate (RHEUMATREX) 2.5 MG tablet, Take 5 mg by mouth once a week. Saturday, Disp: , Rfl:    nystatin (MYCOSTATIN) 100000 UNIT/ML suspension, Take 500,000 Units by mouth., Disp: , Rfl:    ondansetron  (ZOFRAN ) 4 MG tablet, Take 1 tablet (4 mg total) by mouth daily as needed for nausea or vomiting., Disp: 30 tablet, Rfl: 0   pantoprazole  (PROTONIX ) 40 MG tablet, Take 40 mg by mouth daily before breakfast. , Disp: , Rfl:    Polyethyl Glycol-Propyl Glycol (SYSTANE ULTRA OP), Place 1 drop into both eyes daily as needed (dry eyes)., Disp: , Rfl:    polyethylene glycol (MIRALAX  / GLYCOLAX ) packet, Take 17 g by mouth daily as needed for mild constipation., Disp: 14 each, Rfl: 0   rivaroxaban  (XARELTO ) 10 MG TABS tablet, Take 1 tablet (10 mg total) by mouth daily., Disp: 14 tablet, Rfl: 0   senna (SENOKOT) 8.6 MG TABS tablet, Take 2 tablets (17.2 mg total) by mouth 2 (two) times daily., Disp: 30 tablet, Rfl: 0   valACYclovir  (VALTREX ) 500 MG tablet, Take 500 mg by mouth daily as needed (flare up)., Disp: , Rfl:    vitamin  B-12 (CYANOCOBALAMIN ) 1000 MCG tablet, Take 1,000 mcg by mouth daily., Disp: , Rfl:    calcium -vitamin D  (OSCAL WITH D) 500-200 MG-UNIT tablet, Take 1 tablet by mouth daily with breakfast. (Patient not taking: Reported on 04/18/2024), Disp: 30 tablet, Rfl: 2   docusate sodium  (COLACE) 100 MG capsule, Take 1 capsule (100 mg total) by mouth 2 (two) times daily. While taking narcotic pain medicine. (Patient not taking: Reported on 04/18/2024), Disp: 30 capsule, Rfl: 0   fexofenadine (ALLEGRA) 180 MG tablet, Take 180 mg by mouth daily. (Patient not taking: Reported on 04/18/2024), Disp: , Rfl:    Physical Exam:   BP (!) 154/80 (BP Location: Right Arm, Patient Position: Sitting, Cuff Size: Normal)   Pulse 71   SpO2 94%   Salient findings:  CN II-XII intact Bilateral EAC clear and  TM intact with well pneumatized middle ear spaces Anterior rhinoscopy: Septum intact; bilateral inferior turbinates without significant hypertrophy No lesions of oral cavity/oropharynx; edentulous maxilla, no worrisome lesions noted, midline mandible on the buccal surface there is leukoplakic change. On lingual surface close to midline, there is a tender and somewhat endophytic lesion with some mucosal change which surrounds the right mandibular incisor as well - lingual surface lesion does not appear to move well/independently compared to periosteum. No obviously palpable neck masses/lymphadenopathy/thyromegaly No respiratory distress or stridor  Seprately Identifiable Procedures:  Prior to initiating any procedures, risks/benefits/alternatives were explained to the patient and consent obtained. None today  Impression & Plans:  Kyleigha Markert is a 88 y.o. female with:  1. 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  Given area, do think this would be best served at [facility] - would expect she'd need at least marginal mandibulectomy. I did order a CT Neck and Chest to expedite her  workup. Advised to call us  If she does not hear back in 1 week from [facility]  See below regarding exact medications prescribed this encounter including dosages and route: No orders of the defined types were placed in this encounter.     Thank you for allowing me the opportunity to care for your patient. Please do not hesitate to contact me should you have any other questions.  Sincerely, Eldora Blanch, MD Otolaryngologist (ENT), [facility] Phone: [phone] Fax: [phone]  04/18/2024, 11:23 AM   I have personally spent 41 minutes involved in face-to-face and non-face-to-face activities for this patient on the day of the visit.  Professional time spent excludes any procedures performed but includes the following activities, in addition to those noted in the documentation: preparing to see the patient (review of outside documentation and results), performing a medically appropriate examination, counseling, documenting in the electronic health record

## 2024-04-18 NOTE — Patient Instructions (Addendum)
 [phone] --- call if you do not hear back from [facility] in [facility] within a week I have ordered a CT Scan for you to complete prior to your next visit. Please call [facility] Scheduling at [phone] to schedule your imaging if you have not received a call within 24 hours. If you are unable to complete your imaging study prior to your next scheduled visit please call our office to let us  know.

## 2024-04-19 ENCOUNTER — Encounter (INDEPENDENT_AMBULATORY_CARE_PROVIDER_SITE_OTHER): Payer: Self-pay
